# Patient Record
Sex: Male | Born: 1940 | Race: White | Smoking: Former smoker
Health system: Southern US, Community
[De-identification: ages and names within clinical notes are randomized; demographics above are authoritative.]

## PROBLEM LIST (undated history)

## (undated) DIAGNOSIS — E785 Hyperlipidemia, unspecified: Secondary | ICD-10-CM

## (undated) DIAGNOSIS — F329 Major depressive disorder, single episode, unspecified: Secondary | ICD-10-CM

## (undated) DIAGNOSIS — F32A Depression, unspecified: Secondary | ICD-10-CM

## (undated) HISTORY — DX: Depression, unspecified: F32.A

## (undated) HISTORY — DX: Hyperlipidemia, unspecified: E78.5

---
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
## 2015-06-14 DIAGNOSIS — D485 Neoplasm of uncertain behavior of skin: Secondary | ICD-10-CM | POA: Diagnosis not present

## 2015-06-14 DIAGNOSIS — L821 Other seborrheic keratosis: Secondary | ICD-10-CM | POA: Diagnosis not present

## 2015-06-14 DIAGNOSIS — D239 Other benign neoplasm of skin, unspecified: Secondary | ICD-10-CM | POA: Diagnosis not present

## 2015-06-14 DIAGNOSIS — D229 Melanocytic nevi, unspecified: Secondary | ICD-10-CM | POA: Diagnosis not present

## 2015-06-14 DIAGNOSIS — L570 Actinic keratosis: Secondary | ICD-10-CM | POA: Diagnosis not present

## 2015-06-14 DIAGNOSIS — L309 Dermatitis, unspecified: Secondary | ICD-10-CM | POA: Diagnosis not present

## 2015-06-14 DIAGNOSIS — C44329 Squamous cell carcinoma of skin of other parts of face: Secondary | ICD-10-CM | POA: Diagnosis not present

## 2015-06-14 DIAGNOSIS — D1800 Hemangioma unspecified site: Secondary | ICD-10-CM | POA: Diagnosis not present

## 2015-07-02 DIAGNOSIS — I2510 Atherosclerotic heart disease of native coronary artery without angina pectoris: Secondary | ICD-10-CM | POA: Diagnosis not present

## 2015-07-02 DIAGNOSIS — N529 Male erectile dysfunction, unspecified: Secondary | ICD-10-CM | POA: Diagnosis not present

## 2015-07-02 DIAGNOSIS — I10 Essential (primary) hypertension: Secondary | ICD-10-CM | POA: Diagnosis not present

## 2015-07-03 DIAGNOSIS — Z6828 Body mass index (BMI) 28.0-28.9, adult: Secondary | ICD-10-CM | POA: Diagnosis not present

## 2015-07-03 DIAGNOSIS — I2510 Atherosclerotic heart disease of native coronary artery without angina pectoris: Secondary | ICD-10-CM | POA: Diagnosis not present

## 2015-07-03 DIAGNOSIS — Z0000 Encounter for general adult medical examination without abnormal findings: Secondary | ICD-10-CM | POA: Diagnosis not present

## 2015-07-03 DIAGNOSIS — I10 Essential (primary) hypertension: Secondary | ICD-10-CM | POA: Diagnosis not present

## 2015-07-03 DIAGNOSIS — E119 Type 2 diabetes mellitus without complications: Secondary | ICD-10-CM | POA: Diagnosis not present

## 2015-07-03 DIAGNOSIS — F329 Major depressive disorder, single episode, unspecified: Secondary | ICD-10-CM | POA: Diagnosis not present

## 2015-07-03 DIAGNOSIS — E782 Mixed hyperlipidemia: Secondary | ICD-10-CM | POA: Diagnosis not present

## 2015-07-03 DIAGNOSIS — Z125 Encounter for screening for malignant neoplasm of prostate: Secondary | ICD-10-CM | POA: Diagnosis not present

## 2015-07-16 DIAGNOSIS — F321 Major depressive disorder, single episode, moderate: Secondary | ICD-10-CM | POA: Diagnosis not present

## 2015-07-26 DIAGNOSIS — L570 Actinic keratosis: Secondary | ICD-10-CM | POA: Diagnosis not present

## 2015-07-26 DIAGNOSIS — C44329 Squamous cell carcinoma of skin of other parts of face: Secondary | ICD-10-CM | POA: Diagnosis not present

## 2015-08-27 DIAGNOSIS — C44329 Squamous cell carcinoma of skin of other parts of face: Secondary | ICD-10-CM | POA: Diagnosis not present

## 2015-08-27 DIAGNOSIS — L570 Actinic keratosis: Secondary | ICD-10-CM | POA: Diagnosis not present

## 2015-09-03 DIAGNOSIS — L570 Actinic keratosis: Secondary | ICD-10-CM | POA: Diagnosis not present

## 2015-12-31 DIAGNOSIS — I10 Essential (primary) hypertension: Secondary | ICD-10-CM | POA: Diagnosis not present

## 2015-12-31 DIAGNOSIS — M6289 Other specified disorders of muscle: Secondary | ICD-10-CM | POA: Diagnosis not present

## 2015-12-31 DIAGNOSIS — I2510 Atherosclerotic heart disease of native coronary artery without angina pectoris: Secondary | ICD-10-CM | POA: Diagnosis not present

## 2016-01-16 DIAGNOSIS — B351 Tinea unguium: Secondary | ICD-10-CM | POA: Diagnosis not present

## 2016-01-16 DIAGNOSIS — F331 Major depressive disorder, recurrent, moderate: Secondary | ICD-10-CM | POA: Diagnosis not present

## 2016-01-16 DIAGNOSIS — Z23 Encounter for immunization: Secondary | ICD-10-CM | POA: Diagnosis not present

## 2016-02-04 DIAGNOSIS — I2510 Atherosclerotic heart disease of native coronary artery without angina pectoris: Secondary | ICD-10-CM | POA: Diagnosis not present

## 2016-02-04 DIAGNOSIS — I10 Essential (primary) hypertension: Secondary | ICD-10-CM | POA: Diagnosis not present

## 2016-04-21 DIAGNOSIS — Z08 Encounter for follow-up examination after completed treatment for malignant neoplasm: Secondary | ICD-10-CM | POA: Diagnosis not present

## 2016-04-21 DIAGNOSIS — X32XXXA Exposure to sunlight, initial encounter: Secondary | ICD-10-CM | POA: Diagnosis not present

## 2016-04-21 DIAGNOSIS — C44311 Basal cell carcinoma of skin of nose: Secondary | ICD-10-CM | POA: Diagnosis not present

## 2016-04-21 DIAGNOSIS — Z85828 Personal history of other malignant neoplasm of skin: Secondary | ICD-10-CM | POA: Diagnosis not present

## 2016-04-21 DIAGNOSIS — L570 Actinic keratosis: Secondary | ICD-10-CM | POA: Diagnosis not present

## 2016-06-04 DIAGNOSIS — Z85828 Personal history of other malignant neoplasm of skin: Secondary | ICD-10-CM | POA: Diagnosis not present

## 2016-06-04 DIAGNOSIS — L820 Inflamed seborrheic keratosis: Secondary | ICD-10-CM | POA: Diagnosis not present

## 2016-06-04 DIAGNOSIS — Z08 Encounter for follow-up examination after completed treatment for malignant neoplasm: Secondary | ICD-10-CM | POA: Diagnosis not present

## 2016-06-10 DIAGNOSIS — I2510 Atherosclerotic heart disease of native coronary artery without angina pectoris: Secondary | ICD-10-CM | POA: Diagnosis not present

## 2016-07-02 DIAGNOSIS — E782 Mixed hyperlipidemia: Secondary | ICD-10-CM | POA: Diagnosis not present

## 2016-07-02 DIAGNOSIS — Z6828 Body mass index (BMI) 28.0-28.9, adult: Secondary | ICD-10-CM | POA: Diagnosis not present

## 2016-07-02 DIAGNOSIS — M4302 Spondylolysis, cervical region: Secondary | ICD-10-CM | POA: Diagnosis not present

## 2016-07-02 DIAGNOSIS — Z0000 Encounter for general adult medical examination without abnormal findings: Secondary | ICD-10-CM | POA: Diagnosis not present

## 2016-07-02 DIAGNOSIS — Z23 Encounter for immunization: Secondary | ICD-10-CM | POA: Diagnosis not present

## 2016-07-02 DIAGNOSIS — M6289 Other specified disorders of muscle: Secondary | ICD-10-CM | POA: Diagnosis not present

## 2016-07-02 DIAGNOSIS — I10 Essential (primary) hypertension: Secondary | ICD-10-CM | POA: Diagnosis not present

## 2016-07-02 DIAGNOSIS — Z125 Encounter for screening for malignant neoplasm of prostate: Secondary | ICD-10-CM | POA: Diagnosis not present

## 2016-07-02 DIAGNOSIS — I2510 Atherosclerotic heart disease of native coronary artery without angina pectoris: Secondary | ICD-10-CM | POA: Diagnosis not present

## 2016-12-19 DIAGNOSIS — L570 Actinic keratosis: Secondary | ICD-10-CM | POA: Diagnosis not present

## 2016-12-19 DIAGNOSIS — L821 Other seborrheic keratosis: Secondary | ICD-10-CM | POA: Diagnosis not present

## 2016-12-19 DIAGNOSIS — Z85828 Personal history of other malignant neoplasm of skin: Secondary | ICD-10-CM | POA: Diagnosis not present

## 2016-12-19 DIAGNOSIS — D1801 Hemangioma of skin and subcutaneous tissue: Secondary | ICD-10-CM | POA: Diagnosis not present

## 2016-12-30 DIAGNOSIS — M542 Cervicalgia: Secondary | ICD-10-CM | POA: Diagnosis not present

## 2016-12-30 DIAGNOSIS — S46012A Strain of muscle(s) and tendon(s) of the rotator cuff of left shoulder, initial encounter: Secondary | ICD-10-CM | POA: Diagnosis not present

## 2016-12-30 DIAGNOSIS — F329 Major depressive disorder, single episode, unspecified: Secondary | ICD-10-CM | POA: Diagnosis not present

## 2016-12-30 DIAGNOSIS — R6882 Decreased libido: Secondary | ICD-10-CM | POA: Diagnosis not present

## 2016-12-30 DIAGNOSIS — I10 Essential (primary) hypertension: Secondary | ICD-10-CM | POA: Diagnosis not present

## 2016-12-30 DIAGNOSIS — Z23 Encounter for immunization: Secondary | ICD-10-CM | POA: Diagnosis not present

## 2016-12-30 DIAGNOSIS — S46011A Strain of muscle(s) and tendon(s) of the rotator cuff of right shoulder, initial encounter: Secondary | ICD-10-CM | POA: Diagnosis not present

## 2016-12-30 DIAGNOSIS — I2510 Atherosclerotic heart disease of native coronary artery without angina pectoris: Secondary | ICD-10-CM | POA: Diagnosis not present

## 2016-12-30 DIAGNOSIS — N529 Male erectile dysfunction, unspecified: Secondary | ICD-10-CM | POA: Diagnosis not present

## 2017-01-23 DIAGNOSIS — L570 Actinic keratosis: Secondary | ICD-10-CM | POA: Diagnosis not present

## 2017-02-10 DIAGNOSIS — Z23 Encounter for immunization: Secondary | ICD-10-CM | POA: Diagnosis not present

## 2017-02-10 DIAGNOSIS — Z6828 Body mass index (BMI) 28.0-28.9, adult: Secondary | ICD-10-CM | POA: Diagnosis not present

## 2017-02-10 DIAGNOSIS — F331 Major depressive disorder, recurrent, moderate: Secondary | ICD-10-CM | POA: Diagnosis not present

## 2017-02-10 DIAGNOSIS — I2510 Atherosclerotic heart disease of native coronary artery without angina pectoris: Secondary | ICD-10-CM | POA: Diagnosis not present

## 2017-02-10 DIAGNOSIS — M7541 Impingement syndrome of right shoulder: Secondary | ICD-10-CM | POA: Diagnosis not present

## 2017-02-10 DIAGNOSIS — M25519 Pain in unspecified shoulder: Secondary | ICD-10-CM | POA: Diagnosis not present

## 2017-02-10 DIAGNOSIS — E782 Mixed hyperlipidemia: Secondary | ICD-10-CM | POA: Diagnosis not present

## 2017-02-16 ENCOUNTER — Other Ambulatory Visit: Payer: Self-pay | Admitting: Family Medicine

## 2017-02-16 ENCOUNTER — Ambulatory Visit
Admission: RE | Admit: 2017-02-16 | Discharge: 2017-02-16 | Disposition: A | Discharge status: Home / Self Care | Payer: MEDICARE | Source: Ambulatory Visit | Attending: Family Medicine | Admitting: Family Medicine

## 2017-02-16 DIAGNOSIS — M170 Bilateral primary osteoarthritis of knee: Secondary | ICD-10-CM

## 2017-02-16 DIAGNOSIS — M25511 Pain in right shoulder: Secondary | ICD-10-CM

## 2017-02-16 DIAGNOSIS — M179 Osteoarthritis of knee, unspecified: Secondary | ICD-10-CM | POA: Diagnosis not present

## 2017-02-16 DIAGNOSIS — M19011 Primary osteoarthritis, right shoulder: Secondary | ICD-10-CM | POA: Diagnosis not present

## 2017-03-24 DIAGNOSIS — M19011 Primary osteoarthritis, right shoulder: Secondary | ICD-10-CM | POA: Diagnosis not present

## 2017-03-24 DIAGNOSIS — F331 Major depressive disorder, recurrent, moderate: Secondary | ICD-10-CM | POA: Diagnosis not present

## 2017-03-24 DIAGNOSIS — M170 Bilateral primary osteoarthritis of knee: Secondary | ICD-10-CM | POA: Diagnosis not present

## 2017-03-24 DIAGNOSIS — Z6827 Body mass index (BMI) 27.0-27.9, adult: Secondary | ICD-10-CM | POA: Diagnosis not present

## 2017-03-31 ENCOUNTER — Other Ambulatory Visit: Payer: Self-pay | Admitting: Family Medicine

## 2017-04-01 ENCOUNTER — Other Ambulatory Visit: Payer: Self-pay | Admitting: Family Medicine

## 2017-04-01 DIAGNOSIS — M25511 Pain in right shoulder: Secondary | ICD-10-CM

## 2017-04-03 DIAGNOSIS — Z85828 Personal history of other malignant neoplasm of skin: Secondary | ICD-10-CM | POA: Diagnosis not present

## 2017-04-03 DIAGNOSIS — L570 Actinic keratosis: Secondary | ICD-10-CM | POA: Diagnosis not present

## 2017-04-07 DIAGNOSIS — M7541 Impingement syndrome of right shoulder: Secondary | ICD-10-CM | POA: Diagnosis not present

## 2017-04-16 DIAGNOSIS — M7541 Impingement syndrome of right shoulder: Secondary | ICD-10-CM | POA: Diagnosis not present

## 2017-04-20 ENCOUNTER — Ambulatory Visit
Admission: RE | Admit: 2017-04-20 | Discharge: 2017-04-20 | Disposition: A | Discharge status: Home / Self Care | Payer: MEDICARE | Source: Ambulatory Visit | Attending: Family Medicine | Admitting: Family Medicine

## 2017-04-20 DIAGNOSIS — M7541 Impingement syndrome of right shoulder: Secondary | ICD-10-CM | POA: Diagnosis not present

## 2017-04-20 DIAGNOSIS — M25511 Pain in right shoulder: Secondary | ICD-10-CM | POA: Diagnosis not present

## 2017-04-23 DIAGNOSIS — M7541 Impingement syndrome of right shoulder: Secondary | ICD-10-CM | POA: Diagnosis not present

## 2017-05-04 DIAGNOSIS — M7541 Impingement syndrome of right shoulder: Secondary | ICD-10-CM | POA: Diagnosis not present

## 2017-05-08 DIAGNOSIS — M7541 Impingement syndrome of right shoulder: Secondary | ICD-10-CM | POA: Diagnosis not present

## 2017-05-11 DIAGNOSIS — F331 Major depressive disorder, recurrent, moderate: Secondary | ICD-10-CM | POA: Diagnosis not present

## 2017-05-11 DIAGNOSIS — E782 Mixed hyperlipidemia: Secondary | ICD-10-CM | POA: Diagnosis not present

## 2017-05-11 DIAGNOSIS — Z79899 Other long term (current) drug therapy: Secondary | ICD-10-CM | POA: Diagnosis not present

## 2017-05-11 DIAGNOSIS — M19011 Primary osteoarthritis, right shoulder: Secondary | ICD-10-CM | POA: Diagnosis not present

## 2017-05-11 DIAGNOSIS — Z6829 Body mass index (BMI) 29.0-29.9, adult: Secondary | ICD-10-CM | POA: Diagnosis not present

## 2017-05-14 DIAGNOSIS — M7541 Impingement syndrome of right shoulder: Secondary | ICD-10-CM | POA: Diagnosis not present

## 2017-05-19 DIAGNOSIS — R739 Hyperglycemia, unspecified: Secondary | ICD-10-CM | POA: Diagnosis not present

## 2017-05-28 DIAGNOSIS — Z01818 Encounter for other preprocedural examination: Secondary | ICD-10-CM | POA: Diagnosis not present

## 2017-05-28 DIAGNOSIS — M7541 Impingement syndrome of right shoulder: Secondary | ICD-10-CM | POA: Diagnosis not present

## 2017-05-28 DIAGNOSIS — Z6829 Body mass index (BMI) 29.0-29.9, adult: Secondary | ICD-10-CM | POA: Diagnosis not present

## 2017-05-28 DIAGNOSIS — R252 Cramp and spasm: Secondary | ICD-10-CM | POA: Diagnosis not present

## 2017-06-05 HISTORY — PX: SHOULDER SURGERY: SHX246

## 2017-06-15 DIAGNOSIS — S46011A Strain of muscle(s) and tendon(s) of the rotator cuff of right shoulder, initial encounter: Secondary | ICD-10-CM | POA: Diagnosis not present

## 2017-06-15 DIAGNOSIS — M7541 Impingement syndrome of right shoulder: Secondary | ICD-10-CM | POA: Diagnosis not present

## 2017-06-15 DIAGNOSIS — X58XXXA Exposure to other specified factors, initial encounter: Secondary | ICD-10-CM | POA: Diagnosis not present

## 2017-06-15 DIAGNOSIS — Y999 Unspecified external cause status: Secondary | ICD-10-CM | POA: Diagnosis not present

## 2017-06-15 DIAGNOSIS — S46191A Other injury of muscle, fascia and tendon of long head of biceps, right arm, initial encounter: Secondary | ICD-10-CM | POA: Diagnosis not present

## 2017-06-15 DIAGNOSIS — G8918 Other acute postprocedural pain: Secondary | ICD-10-CM | POA: Diagnosis not present

## 2017-06-15 DIAGNOSIS — S43431A Superior glenoid labrum lesion of right shoulder, initial encounter: Secondary | ICD-10-CM | POA: Diagnosis not present

## 2017-06-15 DIAGNOSIS — M19011 Primary osteoarthritis, right shoulder: Secondary | ICD-10-CM | POA: Diagnosis not present

## 2017-06-18 DIAGNOSIS — M25611 Stiffness of right shoulder, not elsewhere classified: Secondary | ICD-10-CM | POA: Diagnosis not present

## 2017-06-22 DIAGNOSIS — M25611 Stiffness of right shoulder, not elsewhere classified: Secondary | ICD-10-CM | POA: Diagnosis not present

## 2017-06-26 DIAGNOSIS — M25611 Stiffness of right shoulder, not elsewhere classified: Secondary | ICD-10-CM | POA: Diagnosis not present

## 2017-06-30 DIAGNOSIS — Z114 Encounter for screening for human immunodeficiency virus [HIV]: Secondary | ICD-10-CM | POA: Diagnosis not present

## 2017-06-30 DIAGNOSIS — Z1329 Encounter for screening for other suspected endocrine disorder: Secondary | ICD-10-CM | POA: Diagnosis not present

## 2017-06-30 DIAGNOSIS — M25611 Stiffness of right shoulder, not elsewhere classified: Secondary | ICD-10-CM | POA: Diagnosis not present

## 2017-06-30 DIAGNOSIS — Z125 Encounter for screening for malignant neoplasm of prostate: Secondary | ICD-10-CM | POA: Diagnosis not present

## 2017-06-30 DIAGNOSIS — E782 Mixed hyperlipidemia: Secondary | ICD-10-CM | POA: Diagnosis not present

## 2017-06-30 DIAGNOSIS — Z79899 Other long term (current) drug therapy: Secondary | ICD-10-CM | POA: Diagnosis not present

## 2017-07-03 DIAGNOSIS — M25611 Stiffness of right shoulder, not elsewhere classified: Secondary | ICD-10-CM | POA: Diagnosis not present

## 2017-07-07 DIAGNOSIS — M25611 Stiffness of right shoulder, not elsewhere classified: Secondary | ICD-10-CM | POA: Diagnosis not present

## 2017-07-14 DIAGNOSIS — M25611 Stiffness of right shoulder, not elsewhere classified: Secondary | ICD-10-CM | POA: Diagnosis not present

## 2017-07-21 DIAGNOSIS — M25611 Stiffness of right shoulder, not elsewhere classified: Secondary | ICD-10-CM | POA: Diagnosis not present

## 2017-07-24 DIAGNOSIS — M25611 Stiffness of right shoulder, not elsewhere classified: Secondary | ICD-10-CM | POA: Diagnosis not present

## 2017-07-28 DIAGNOSIS — M25611 Stiffness of right shoulder, not elsewhere classified: Secondary | ICD-10-CM | POA: Diagnosis not present

## 2017-07-31 DIAGNOSIS — M25611 Stiffness of right shoulder, not elsewhere classified: Secondary | ICD-10-CM | POA: Diagnosis not present

## 2017-08-04 DIAGNOSIS — M25611 Stiffness of right shoulder, not elsewhere classified: Secondary | ICD-10-CM | POA: Diagnosis not present

## 2017-08-06 DIAGNOSIS — L821 Other seborrheic keratosis: Secondary | ICD-10-CM | POA: Diagnosis not present

## 2017-08-06 DIAGNOSIS — L853 Xerosis cutis: Secondary | ICD-10-CM | POA: Diagnosis not present

## 2017-08-06 DIAGNOSIS — D485 Neoplasm of uncertain behavior of skin: Secondary | ICD-10-CM | POA: Diagnosis not present

## 2017-08-06 DIAGNOSIS — D1801 Hemangioma of skin and subcutaneous tissue: Secondary | ICD-10-CM | POA: Diagnosis not present

## 2017-08-06 DIAGNOSIS — L570 Actinic keratosis: Secondary | ICD-10-CM | POA: Diagnosis not present

## 2017-08-06 DIAGNOSIS — L820 Inflamed seborrheic keratosis: Secondary | ICD-10-CM | POA: Diagnosis not present

## 2017-08-06 DIAGNOSIS — Z85828 Personal history of other malignant neoplasm of skin: Secondary | ICD-10-CM | POA: Diagnosis not present

## 2017-08-06 DIAGNOSIS — L814 Other melanin hyperpigmentation: Secondary | ICD-10-CM | POA: Diagnosis not present

## 2017-08-06 DIAGNOSIS — B351 Tinea unguium: Secondary | ICD-10-CM | POA: Diagnosis not present

## 2017-08-06 DIAGNOSIS — C44519 Basal cell carcinoma of skin of other part of trunk: Secondary | ICD-10-CM | POA: Diagnosis not present

## 2017-08-07 DIAGNOSIS — Z1211 Encounter for screening for malignant neoplasm of colon: Secondary | ICD-10-CM | POA: Diagnosis not present

## 2017-08-07 DIAGNOSIS — Z6828 Body mass index (BMI) 28.0-28.9, adult: Secondary | ICD-10-CM | POA: Diagnosis not present

## 2017-08-07 DIAGNOSIS — E118 Type 2 diabetes mellitus with unspecified complications: Secondary | ICD-10-CM | POA: Diagnosis not present

## 2017-08-07 DIAGNOSIS — K5900 Constipation, unspecified: Secondary | ICD-10-CM | POA: Diagnosis not present

## 2017-08-07 DIAGNOSIS — M25611 Stiffness of right shoulder, not elsewhere classified: Secondary | ICD-10-CM | POA: Diagnosis not present

## 2017-08-07 DIAGNOSIS — Z0000 Encounter for general adult medical examination without abnormal findings: Secondary | ICD-10-CM | POA: Diagnosis not present

## 2017-08-07 DIAGNOSIS — Z9189 Other specified personal risk factors, not elsewhere classified: Secondary | ICD-10-CM | POA: Diagnosis not present

## 2017-08-07 DIAGNOSIS — B351 Tinea unguium: Secondary | ICD-10-CM | POA: Diagnosis not present

## 2017-08-07 DIAGNOSIS — E782 Mixed hyperlipidemia: Secondary | ICD-10-CM | POA: Diagnosis not present

## 2017-08-11 DIAGNOSIS — M25611 Stiffness of right shoulder, not elsewhere classified: Secondary | ICD-10-CM | POA: Diagnosis not present

## 2017-08-14 DIAGNOSIS — M25611 Stiffness of right shoulder, not elsewhere classified: Secondary | ICD-10-CM | POA: Diagnosis not present

## 2017-08-17 DIAGNOSIS — M25611 Stiffness of right shoulder, not elsewhere classified: Secondary | ICD-10-CM | POA: Diagnosis not present

## 2017-08-20 DIAGNOSIS — M25611 Stiffness of right shoulder, not elsewhere classified: Secondary | ICD-10-CM | POA: Diagnosis not present

## 2017-08-25 DIAGNOSIS — M25611 Stiffness of right shoulder, not elsewhere classified: Secondary | ICD-10-CM | POA: Diagnosis not present

## 2017-08-27 DIAGNOSIS — M25611 Stiffness of right shoulder, not elsewhere classified: Secondary | ICD-10-CM | POA: Diagnosis not present

## 2017-09-01 DIAGNOSIS — M25611 Stiffness of right shoulder, not elsewhere classified: Secondary | ICD-10-CM | POA: Diagnosis not present

## 2017-09-03 DIAGNOSIS — F411 Generalized anxiety disorder: Secondary | ICD-10-CM | POA: Diagnosis not present

## 2017-09-03 DIAGNOSIS — E118 Type 2 diabetes mellitus with unspecified complications: Secondary | ICD-10-CM | POA: Diagnosis not present

## 2017-09-03 DIAGNOSIS — Z6828 Body mass index (BMI) 28.0-28.9, adult: Secondary | ICD-10-CM | POA: Diagnosis not present

## 2017-09-03 DIAGNOSIS — F331 Major depressive disorder, recurrent, moderate: Secondary | ICD-10-CM | POA: Diagnosis not present

## 2017-09-04 DIAGNOSIS — M25611 Stiffness of right shoulder, not elsewhere classified: Secondary | ICD-10-CM | POA: Diagnosis not present

## 2017-09-08 DIAGNOSIS — M25611 Stiffness of right shoulder, not elsewhere classified: Secondary | ICD-10-CM | POA: Diagnosis not present

## 2017-09-11 DIAGNOSIS — M25611 Stiffness of right shoulder, not elsewhere classified: Secondary | ICD-10-CM | POA: Diagnosis not present

## 2017-09-15 DIAGNOSIS — M25611 Stiffness of right shoulder, not elsewhere classified: Secondary | ICD-10-CM | POA: Diagnosis not present

## 2017-09-21 DIAGNOSIS — M25611 Stiffness of right shoulder, not elsewhere classified: Secondary | ICD-10-CM | POA: Diagnosis not present

## 2017-09-22 DIAGNOSIS — Z9889 Other specified postprocedural states: Secondary | ICD-10-CM | POA: Diagnosis not present

## 2017-09-22 DIAGNOSIS — M25611 Stiffness of right shoulder, not elsewhere classified: Secondary | ICD-10-CM | POA: Diagnosis not present

## 2017-09-23 DIAGNOSIS — Z6828 Body mass index (BMI) 28.0-28.9, adult: Secondary | ICD-10-CM | POA: Diagnosis not present

## 2017-09-23 DIAGNOSIS — F411 Generalized anxiety disorder: Secondary | ICD-10-CM | POA: Diagnosis not present

## 2017-09-23 DIAGNOSIS — F331 Major depressive disorder, recurrent, moderate: Secondary | ICD-10-CM | POA: Diagnosis not present

## 2017-09-23 DIAGNOSIS — E118 Type 2 diabetes mellitus with unspecified complications: Secondary | ICD-10-CM | POA: Diagnosis not present

## 2017-09-23 DIAGNOSIS — M19011 Primary osteoarthritis, right shoulder: Secondary | ICD-10-CM | POA: Diagnosis not present

## 2017-09-30 DIAGNOSIS — B078 Other viral warts: Secondary | ICD-10-CM | POA: Diagnosis not present

## 2017-09-30 DIAGNOSIS — M25611 Stiffness of right shoulder, not elsewhere classified: Secondary | ICD-10-CM | POA: Diagnosis not present

## 2017-09-30 DIAGNOSIS — Z85828 Personal history of other malignant neoplasm of skin: Secondary | ICD-10-CM | POA: Diagnosis not present

## 2017-09-30 DIAGNOSIS — C44519 Basal cell carcinoma of skin of other part of trunk: Secondary | ICD-10-CM | POA: Diagnosis not present

## 2017-10-06 DIAGNOSIS — M25611 Stiffness of right shoulder, not elsewhere classified: Secondary | ICD-10-CM | POA: Diagnosis not present

## 2017-10-13 DIAGNOSIS — M25611 Stiffness of right shoulder, not elsewhere classified: Secondary | ICD-10-CM | POA: Diagnosis not present

## 2017-10-20 DIAGNOSIS — M25611 Stiffness of right shoulder, not elsewhere classified: Secondary | ICD-10-CM | POA: Diagnosis not present

## 2017-10-27 DIAGNOSIS — M25611 Stiffness of right shoulder, not elsewhere classified: Secondary | ICD-10-CM | POA: Diagnosis not present

## 2017-11-02 DIAGNOSIS — E118 Type 2 diabetes mellitus with unspecified complications: Secondary | ICD-10-CM | POA: Diagnosis not present

## 2017-11-02 DIAGNOSIS — F331 Major depressive disorder, recurrent, moderate: Secondary | ICD-10-CM | POA: Diagnosis not present

## 2017-11-02 DIAGNOSIS — F411 Generalized anxiety disorder: Secondary | ICD-10-CM | POA: Diagnosis not present

## 2017-11-02 DIAGNOSIS — Z6826 Body mass index (BMI) 26.0-26.9, adult: Secondary | ICD-10-CM | POA: Diagnosis not present

## 2017-11-03 DIAGNOSIS — Z9889 Other specified postprocedural states: Secondary | ICD-10-CM | POA: Diagnosis not present

## 2017-11-03 DIAGNOSIS — M25511 Pain in right shoulder: Secondary | ICD-10-CM | POA: Diagnosis not present

## 2017-11-17 DIAGNOSIS — Z79899 Other long term (current) drug therapy: Secondary | ICD-10-CM | POA: Diagnosis not present

## 2017-11-17 DIAGNOSIS — B351 Tinea unguium: Secondary | ICD-10-CM | POA: Diagnosis not present

## 2018-02-02 DIAGNOSIS — E782 Mixed hyperlipidemia: Secondary | ICD-10-CM | POA: Diagnosis not present

## 2018-02-02 DIAGNOSIS — E118 Type 2 diabetes mellitus with unspecified complications: Secondary | ICD-10-CM | POA: Diagnosis not present

## 2018-02-02 DIAGNOSIS — Z9189 Other specified personal risk factors, not elsewhere classified: Secondary | ICD-10-CM | POA: Diagnosis not present

## 2018-02-05 DIAGNOSIS — E782 Mixed hyperlipidemia: Secondary | ICD-10-CM | POA: Diagnosis not present

## 2018-02-05 DIAGNOSIS — Z23 Encounter for immunization: Secondary | ICD-10-CM | POA: Diagnosis not present

## 2018-02-05 DIAGNOSIS — F411 Generalized anxiety disorder: Secondary | ICD-10-CM | POA: Diagnosis not present

## 2018-02-05 DIAGNOSIS — I519 Heart disease, unspecified: Secondary | ICD-10-CM | POA: Diagnosis not present

## 2018-02-05 DIAGNOSIS — E118 Type 2 diabetes mellitus with unspecified complications: Secondary | ICD-10-CM | POA: Diagnosis not present

## 2018-03-10 DIAGNOSIS — I77811 Abdominal aortic ectasia: Secondary | ICD-10-CM | POA: Diagnosis not present

## 2018-03-18 DIAGNOSIS — I447 Left bundle-branch block, unspecified: Secondary | ICD-10-CM | POA: Diagnosis not present

## 2018-03-18 DIAGNOSIS — I2510 Atherosclerotic heart disease of native coronary artery without angina pectoris: Secondary | ICD-10-CM | POA: Diagnosis not present

## 2018-03-18 DIAGNOSIS — I77811 Abdominal aortic ectasia: Secondary | ICD-10-CM | POA: Diagnosis not present

## 2018-07-02 ENCOUNTER — Other Ambulatory Visit: Payer: Self-pay | Admitting: Family Medicine

## 2018-07-02 ENCOUNTER — Ambulatory Visit
Admission: RE | Admit: 2018-07-02 | Discharge: 2018-07-02 | Disposition: A | Discharge status: Home / Self Care | Payer: MEDICARE | Source: Ambulatory Visit | Attending: Family Medicine | Admitting: Family Medicine

## 2018-07-02 DIAGNOSIS — S3992XA Unspecified injury of lower back, initial encounter: Secondary | ICD-10-CM | POA: Diagnosis not present

## 2018-07-02 DIAGNOSIS — K5900 Constipation, unspecified: Secondary | ICD-10-CM | POA: Diagnosis not present

## 2018-07-02 DIAGNOSIS — Z6828 Body mass index (BMI) 28.0-28.9, adult: Secondary | ICD-10-CM | POA: Diagnosis not present

## 2018-07-02 DIAGNOSIS — W19XXXA Unspecified fall, initial encounter: Secondary | ICD-10-CM

## 2018-07-02 DIAGNOSIS — Z9189 Other specified personal risk factors, not elsewhere classified: Secondary | ICD-10-CM | POA: Diagnosis not present

## 2018-07-02 DIAGNOSIS — M545 Low back pain: Secondary | ICD-10-CM | POA: Diagnosis not present

## 2018-07-02 DIAGNOSIS — M533 Sacrococcygeal disorders, not elsewhere classified: Secondary | ICD-10-CM | POA: Diagnosis not present

## 2018-07-02 DIAGNOSIS — R52 Pain, unspecified: Secondary | ICD-10-CM

## 2018-07-05 DIAGNOSIS — M545 Low back pain: Secondary | ICD-10-CM | POA: Diagnosis not present

## 2018-07-07 DIAGNOSIS — K5900 Constipation, unspecified: Secondary | ICD-10-CM | POA: Diagnosis not present

## 2018-07-07 DIAGNOSIS — M4856XD Collapsed vertebra, not elsewhere classified, lumbar region, subsequent encounter for fracture with routine healing: Secondary | ICD-10-CM | POA: Diagnosis not present

## 2018-07-07 DIAGNOSIS — Z6826 Body mass index (BMI) 26.0-26.9, adult: Secondary | ICD-10-CM | POA: Diagnosis not present

## 2018-07-07 DIAGNOSIS — M4857XD Collapsed vertebra, not elsewhere classified, lumbosacral region, subsequent encounter for fracture with routine healing: Secondary | ICD-10-CM | POA: Diagnosis not present

## 2018-07-13 DIAGNOSIS — M545 Low back pain: Secondary | ICD-10-CM | POA: Diagnosis not present

## 2018-07-19 DIAGNOSIS — M4856XD Collapsed vertebra, not elsewhere classified, lumbar region, subsequent encounter for fracture with routine healing: Secondary | ICD-10-CM | POA: Diagnosis not present

## 2018-07-23 ENCOUNTER — Other Ambulatory Visit: Payer: Self-pay | Admitting: Orthopedic Surgery

## 2018-07-23 ENCOUNTER — Other Ambulatory Visit (HOSPITAL_COMMUNITY): Payer: Self-pay | Admitting: Orthopedic Surgery

## 2018-07-23 DIAGNOSIS — M4856XD Collapsed vertebra, not elsewhere classified, lumbar region, subsequent encounter for fracture with routine healing: Secondary | ICD-10-CM

## 2018-08-10 DIAGNOSIS — M4857XD Collapsed vertebra, not elsewhere classified, lumbosacral region, subsequent encounter for fracture with routine healing: Secondary | ICD-10-CM | POA: Diagnosis not present

## 2018-08-10 DIAGNOSIS — Z0000 Encounter for general adult medical examination without abnormal findings: Secondary | ICD-10-CM | POA: Diagnosis not present

## 2018-08-10 DIAGNOSIS — I2510 Atherosclerotic heart disease of native coronary artery without angina pectoris: Secondary | ICD-10-CM | POA: Diagnosis not present

## 2018-08-10 DIAGNOSIS — F331 Major depressive disorder, recurrent, moderate: Secondary | ICD-10-CM | POA: Diagnosis not present

## 2018-08-10 DIAGNOSIS — I5030 Unspecified diastolic (congestive) heart failure: Secondary | ICD-10-CM | POA: Diagnosis not present

## 2018-08-10 DIAGNOSIS — E118 Type 2 diabetes mellitus with unspecified complications: Secondary | ICD-10-CM | POA: Diagnosis not present

## 2018-08-10 DIAGNOSIS — F411 Generalized anxiety disorder: Secondary | ICD-10-CM | POA: Diagnosis not present

## 2018-10-14 ENCOUNTER — Other Ambulatory Visit: Payer: Self-pay

## 2018-10-14 MED ORDER — FUROSEMIDE 40 MG PO TABS: 40.0000 mg | ORAL_TABLET | ORAL | 0 refills | Status: DC | PRN

## 2018-11-22 DIAGNOSIS — M4856XD Collapsed vertebra, not elsewhere classified, lumbar region, subsequent encounter for fracture with routine healing: Secondary | ICD-10-CM | POA: Diagnosis not present

## 2018-12-06 DIAGNOSIS — M545 Low back pain: Secondary | ICD-10-CM | POA: Diagnosis not present

## 2018-12-13 DIAGNOSIS — M545 Low back pain: Secondary | ICD-10-CM | POA: Diagnosis not present

## 2018-12-16 DIAGNOSIS — M545 Low back pain: Secondary | ICD-10-CM | POA: Diagnosis not present

## 2018-12-24 DIAGNOSIS — M545 Low back pain: Secondary | ICD-10-CM | POA: Diagnosis not present

## 2018-12-27 DIAGNOSIS — M545 Low back pain: Secondary | ICD-10-CM | POA: Diagnosis not present

## 2018-12-30 DIAGNOSIS — M545 Low back pain: Secondary | ICD-10-CM | POA: Diagnosis not present

## 2019-01-11 DIAGNOSIS — D485 Neoplasm of uncertain behavior of skin: Secondary | ICD-10-CM | POA: Diagnosis not present

## 2019-01-11 DIAGNOSIS — C44612 Basal cell carcinoma of skin of right upper limb, including shoulder: Secondary | ICD-10-CM | POA: Diagnosis not present

## 2019-01-11 DIAGNOSIS — L814 Other melanin hyperpigmentation: Secondary | ICD-10-CM | POA: Diagnosis not present

## 2019-01-11 DIAGNOSIS — L570 Actinic keratosis: Secondary | ICD-10-CM | POA: Diagnosis not present

## 2019-01-11 DIAGNOSIS — C44321 Squamous cell carcinoma of skin of nose: Secondary | ICD-10-CM | POA: Diagnosis not present

## 2019-01-11 DIAGNOSIS — D225 Melanocytic nevi of trunk: Secondary | ICD-10-CM | POA: Diagnosis not present

## 2019-01-11 DIAGNOSIS — L821 Other seborrheic keratosis: Secondary | ICD-10-CM | POA: Diagnosis not present

## 2019-01-11 DIAGNOSIS — D1801 Hemangioma of skin and subcutaneous tissue: Secondary | ICD-10-CM | POA: Diagnosis not present

## 2019-01-11 DIAGNOSIS — Z85828 Personal history of other malignant neoplasm of skin: Secondary | ICD-10-CM | POA: Diagnosis not present

## 2019-01-12 DIAGNOSIS — M545 Low back pain: Secondary | ICD-10-CM | POA: Diagnosis not present

## 2019-01-24 DIAGNOSIS — Z85828 Personal history of other malignant neoplasm of skin: Secondary | ICD-10-CM | POA: Diagnosis not present

## 2019-01-24 DIAGNOSIS — C44321 Squamous cell carcinoma of skin of nose: Secondary | ICD-10-CM | POA: Diagnosis not present

## 2019-01-25 DIAGNOSIS — Z114 Encounter for screening for human immunodeficiency virus [HIV]: Secondary | ICD-10-CM | POA: Diagnosis not present

## 2019-01-25 DIAGNOSIS — Z125 Encounter for screening for malignant neoplasm of prostate: Secondary | ICD-10-CM | POA: Diagnosis not present

## 2019-01-25 DIAGNOSIS — Z79899 Other long term (current) drug therapy: Secondary | ICD-10-CM | POA: Diagnosis not present

## 2019-01-25 DIAGNOSIS — E782 Mixed hyperlipidemia: Secondary | ICD-10-CM | POA: Diagnosis not present

## 2019-01-25 DIAGNOSIS — Z1329 Encounter for screening for other suspected endocrine disorder: Secondary | ICD-10-CM | POA: Diagnosis not present

## 2019-01-25 DIAGNOSIS — Z9189 Other specified personal risk factors, not elsewhere classified: Secondary | ICD-10-CM | POA: Diagnosis not present

## 2019-01-25 DIAGNOSIS — E118 Type 2 diabetes mellitus with unspecified complications: Secondary | ICD-10-CM | POA: Diagnosis not present

## 2019-01-28 DIAGNOSIS — L570 Actinic keratosis: Secondary | ICD-10-CM | POA: Diagnosis not present

## 2019-02-10 DIAGNOSIS — L821 Other seborrheic keratosis: Secondary | ICD-10-CM | POA: Diagnosis not present

## 2019-02-10 DIAGNOSIS — C44612 Basal cell carcinoma of skin of right upper limb, including shoulder: Secondary | ICD-10-CM | POA: Diagnosis not present

## 2019-02-10 DIAGNOSIS — L570 Actinic keratosis: Secondary | ICD-10-CM | POA: Diagnosis not present

## 2019-02-10 DIAGNOSIS — Z85828 Personal history of other malignant neoplasm of skin: Secondary | ICD-10-CM | POA: Diagnosis not present

## 2019-02-14 DIAGNOSIS — I519 Heart disease, unspecified: Secondary | ICD-10-CM | POA: Diagnosis not present

## 2019-02-14 DIAGNOSIS — F331 Major depressive disorder, recurrent, moderate: Secondary | ICD-10-CM | POA: Diagnosis not present

## 2019-02-14 DIAGNOSIS — I2510 Atherosclerotic heart disease of native coronary artery without angina pectoris: Secondary | ICD-10-CM | POA: Diagnosis not present

## 2019-02-14 DIAGNOSIS — E118 Type 2 diabetes mellitus with unspecified complications: Secondary | ICD-10-CM | POA: Diagnosis not present

## 2019-02-14 DIAGNOSIS — E782 Mixed hyperlipidemia: Secondary | ICD-10-CM | POA: Diagnosis not present

## 2019-02-22 DIAGNOSIS — Z23 Encounter for immunization: Secondary | ICD-10-CM | POA: Diagnosis not present

## 2019-03-23 ENCOUNTER — Ambulatory Visit: Payer: MEDICARE | Admitting: Cardiology

## 2019-03-25 DIAGNOSIS — L570 Actinic keratosis: Secondary | ICD-10-CM | POA: Diagnosis not present

## 2019-03-25 DIAGNOSIS — Z85828 Personal history of other malignant neoplasm of skin: Secondary | ICD-10-CM | POA: Diagnosis not present

## 2019-04-22 NOTE — Progress Notes (Signed)
Follow up visit  Subjective:   Phillip Allen, male    DOB: [birth year], 78 y.o.   MRN: [id number]   HPI  78 y.o. Caucasian male with hypertension, coronary artery disease s/p angioplasty in 2010, left bundle branch block, mild aortic ectasia, grade 2 diastolic dysfunction.  Patient has no complaints today. He had a compression fracture of his spine earlier this year, but is recovering from it. He only takes occasional ibuprofen. He is not going to the gym due to the pandemic, but stays active walking etc. He denies chest pain, shortness of breath, palpitations, leg edema, orthopnea, PND, TIA/syncope. Blood pressure is elevated today, but has never been elevated before. He recently had blood work done through his PCP.   Current Outpatient Medications on File Prior to Visit  Medication Sig Dispense Refill  . aspirin (ASPIR-LOW) 81 MG EC tablet Take 81 mg by mouth daily.    . Boswellia-Glucosamine-Vit D (OSTEO BI-FLEX ONE PER DAY PO) Take by mouth. 2 daily    . Docusate Sodium (DSS) 100 MG CAPS 1 in the a.m. 2 at night    . DULoxetine (CYMBALTA) 60 MG capsule Take 60 mg by mouth daily.    Javier Docker Oil 500 MG CAPS Take by mouth daily.    . Multiple Vitamins-Minerals (MULTIVITAMIN MEN 50+ PO) Take by mouth daily.    . naproxen sodium (ALEVE) 220 MG tablet Take 220 mg by mouth as needed.    . rosuvastatin (CRESTOR) 40 MG tablet Take 40 mg by mouth daily.     No current facility-administered medications on file prior to visit.    Cardiovascular & other pertient studies:   EKG 04/25/2019: Sinus rhythm 69 bpm. First degree A-V block  Left bundle branch block.    Aorta duplex 03/10/2018: Maximum aorta diameter 2.65 cm.  Diffuse calcific plaque noted in proximal, mid, and distal aorta.  Mild abdominal aortic tic ectasia noted.  No significant change compared to previous study in 2018.  Follow-up when clinically indicated.  Echocardiogram 04/03/2017: EF 58%.  Abnormal septal motion due to  left bundle branch block.  Grade 2 diastolic dysfunction.  Elevated left atrial pressure. Moderate left atrial dilatation. Mild aortic valve calcification.  Trace aortic stenosis. Mild to moderate mitral regurgitation. Mild to moderate tricuspid regurgitation. No evidence of pulmonary hypertension.  Recent labs: No recent labs available   Review of Systems  Constitution: Negative for decreased appetite, malaise/fatigue, weight gain and weight loss.  HENT: Negative for congestion.   Eyes: Negative for visual disturbance.  Cardiovascular: Negative for chest pain, dyspnea on exertion, leg swelling, palpitations and syncope.  Respiratory: Negative for cough.   Endocrine: Negative for cold intolerance.  Hematologic/Lymphatic: Does not bruise/bleed easily.  Skin: Negative for itching and rash.  Musculoskeletal: Negative for myalgias.  Gastrointestinal: Negative for abdominal pain, nausea and vomiting.  Genitourinary: Negative for dysuria.  Neurological: Negative for dizziness and weakness.  Psychiatric/Behavioral: The patient is not nervous/anxious.   All other systems reviewed and are negative.        Vitals:   04/25/19 1044 04/25/19 1108  BP: (!) 142/75 (!) 151/73  Pulse: 65 66  Temp: (!) 97.3 F (36.3 C)   SpO2: 97%      Body mass index is 26.86 kg/m. Filed Weights   04/25/19 1044  Weight: 181 lb 14.4 oz (82.5 kg)     Objective:   Physical Exam  Constitutional: He is oriented to person, place, and time. He appears well-developed and well-nourished. No  distress.  HENT:  Head: Normocephalic and atraumatic.  Eyes: Pupils are equal, round, and reactive to light. Conjunctivae are normal.  Neck: No JVD present.  Cardiovascular: Normal rate, regular rhythm and intact distal pulses.  No murmur heard. Pulmonary/Chest: Effort normal and breath sounds normal. He has no wheezes. He has no rales.  Abdominal: Soft. Bowel sounds are normal. There is no rebound.    Musculoskeletal:        General: No edema.  Lymphadenopathy:    He has no cervical adenopathy.  Neurological: He is alert and oriented to person, place, and time. No cranial nerve deficit.  Skin: Skin is warm and dry.  Psychiatric: He has a normal mood and affect.  Nursing note and vitals reviewed.         Assessment & Recommendations:   78 y.o. Caucasian male with hypertension, coronary artery disease s/p angioplasty in 2010, left bundle branch block, mild aortic ectasia, grade 2 diastolic dysfunction.  1. Elevated blood pressure reading in office with white coat syndrome, without diagnosis of hypertension Encourage home monitoring. Recommend repeat visit for BP check in 3-4 weeks. Encourage regular waling and low salt diet.   2. Coronary artery disease involving native coronary artery of native heart without angina pectoris No symptoms. Continue Aspirin, statin. Will get labs from PCP.    [name], MD [facility] Pager: [phone] Office: [phone]

## 2019-04-25 ENCOUNTER — Other Ambulatory Visit: Payer: Self-pay

## 2019-04-25 ENCOUNTER — Encounter: Payer: Self-pay | Admitting: Cardiology

## 2019-04-25 ENCOUNTER — Ambulatory Visit (INDEPENDENT_AMBULATORY_CARE_PROVIDER_SITE_OTHER): Payer: MEDICARE | Admitting: Cardiology

## 2019-04-25 VITALS — BP 151/73 | HR 66 | Temp 97.3°F | Ht 69.0 in | Wt 181.9 lb

## 2019-04-25 DIAGNOSIS — R030 Elevated blood-pressure reading, without diagnosis of hypertension: Secondary | ICD-10-CM | POA: Diagnosis not present

## 2019-04-25 DIAGNOSIS — I2510 Atherosclerotic heart disease of native coronary artery without angina pectoris: Secondary | ICD-10-CM | POA: Diagnosis not present

## 2019-04-25 DIAGNOSIS — I447 Left bundle-branch block, unspecified: Secondary | ICD-10-CM

## 2019-05-23 ENCOUNTER — Encounter: Payer: Self-pay | Admitting: Cardiology

## 2019-05-23 ENCOUNTER — Ambulatory Visit (INDEPENDENT_AMBULATORY_CARE_PROVIDER_SITE_OTHER): Payer: MEDICARE | Admitting: Cardiology

## 2019-05-23 ENCOUNTER — Other Ambulatory Visit: Payer: Self-pay

## 2019-05-23 VITALS — BP 145/74 | HR 64 | Ht 69.0 in | Wt 181.0 lb

## 2019-05-23 DIAGNOSIS — I10 Essential (primary) hypertension: Secondary | ICD-10-CM | POA: Diagnosis not present

## 2019-05-23 DIAGNOSIS — I2510 Atherosclerotic heart disease of native coronary artery without angina pectoris: Secondary | ICD-10-CM | POA: Diagnosis not present

## 2019-05-23 MED ORDER — AMLODIPINE BESYLATE 5 MG PO TABS: 5.0000 mg | ORAL_TABLET | Freq: Every day | ORAL | 3 refills | Status: DC

## 2019-05-23 NOTE — Progress Notes (Signed)
   Follow up visit  Subjective:   Phillip Allen, male    DOB: 07-Aug-1940, 79 y.o.   MRN: [id number]   HPI  79 y.o. Caucasian male with hypertension, coronary artery disease s/p angioplasty in 2010, left bundle branch block, mild aortic ectasia, grade 2 diastolic dysfunction.  Blood pressure remains elevated. He denies any symptoms.   Current Outpatient Medications on File Prior to Visit  Medication Sig Dispense Refill  . aspirin (ASPIR-LOW) 81 MG EC tablet Take 81 mg by mouth daily.    . Boswellia-Glucosamine-Vit D (OSTEO BI-FLEX ONE PER DAY PO) Take by mouth. 2 daily    . Docusate Sodium (DSS) 100 MG CAPS 1 in the a.m. 2 at night    . DULoxetine (CYMBALTA) 60 MG capsule Take 60 mg by mouth daily.    Javier Docker Oil 500 MG CAPS Take by mouth daily.    . Multiple Vitamins-Minerals (MULTIVITAMIN MEN 50+ PO) Take by mouth daily.    . naproxen sodium (ALEVE) 220 MG tablet Take 220 mg by mouth as needed.    . rosuvastatin (CRESTOR) 40 MG tablet Take 40 mg by mouth daily.     No current facility-administered medications on file prior to visit.    Cardiovascular & other pertient studies:   EKG 04/25/2019: Sinus rhythm 69 bpm. First degree A-V block  Left bundle branch block.    Aorta duplex 03/10/2018: Maximum aorta diameter 2.65 cm.  Diffuse calcific plaque noted in proximal, mid, and distal aorta.  Mild abdominal aortic tic ectasia noted.  No significant change compared to previous study in 2018.  Follow-up when clinically indicated.  Echocardiogram 04/03/2017: EF 58%.  Abnormal septal motion due to left bundle branch block.  Grade 2 diastolic dysfunction.  Elevated left atrial pressure. Moderate left atrial dilatation. Mild aortic valve calcification.  Trace aortic stenosis. Mild to moderate mitral regurgitation. Mild to moderate tricuspid regurgitation. No evidence of pulmonary hypertension.  Recent labs: 01/25/2019: Glucose 122, BUN/Cr 10/0.7. EGFR 91. Na/K 139/5/0. Rest of  the CMP normal H/H 14/42. MCV 96. Platelets 296 HbA1C 5.9% Chol 162, TG 139, HDL 61, LDL 77 TSH 2.3 normal    Review of Systems  Cardiovascular: Negative for chest pain, dyspnea on exertion, leg swelling, palpitations and syncope.         Vitals:   05/23/19 1422  BP: (!) 145/74  Pulse: 64  SpO2: 98%     Body mass index is 26.73 kg/m. Filed Weights   05/23/19 1422  Weight: 181 lb (82.1 kg)     Objective:   Physical Exam  Constitutional: He appears well-developed and well-nourished.  Neck: No JVD present.  Cardiovascular: Normal rate, regular rhythm, normal heart sounds and intact distal pulses.  No murmur heard. Pulmonary/Chest: Effort normal and breath sounds normal. He has no wheezes. He has no rales.  Musculoskeletal:        General: No edema.  Nursing note and vitals reviewed.         Assessment & Recommendations:   79 y.o. Caucasian male with hypertension, coronary artery disease s/p angioplasty in 2010, left bundle branch block, mild aortic ectasia, grade 2 diastolic dysfunction.  1. Hypertension: Started amlodipine 5 mg daily. 4 wk VV .  2. Coronary artery disease involving native coronary artery of native heart without angina pectoris No symptoms. Continue Aspirin, statin. Reviewed 01/2019 labs with the patient, which are satisfactory.    [name], MD [facility] Pager: [phone] Office: [phone]

## 2019-05-26 DIAGNOSIS — Z20828 Contact with and (suspected) exposure to other viral communicable diseases: Secondary | ICD-10-CM | POA: Diagnosis not present

## 2019-05-26 DIAGNOSIS — Z03818 Encounter for observation for suspected exposure to other biological agents ruled out: Secondary | ICD-10-CM | POA: Diagnosis not present

## 2019-05-26 DIAGNOSIS — U071 COVID-19: Secondary | ICD-10-CM | POA: Diagnosis not present

## 2019-06-15 ENCOUNTER — Telehealth: Payer: MEDICARE | Admitting: Cardiology

## 2019-06-15 ENCOUNTER — Other Ambulatory Visit: Payer: Self-pay

## 2019-06-22 ENCOUNTER — Telehealth: Payer: MEDICARE | Admitting: Cardiology

## 2019-06-22 ENCOUNTER — Other Ambulatory Visit: Payer: Self-pay

## 2019-06-22 VITALS — BP 139/75

## 2019-06-22 DIAGNOSIS — I2510 Atherosclerotic heart disease of native coronary artery without angina pectoris: Secondary | ICD-10-CM | POA: Diagnosis not present

## 2019-06-22 DIAGNOSIS — I10 Essential (primary) hypertension: Secondary | ICD-10-CM

## 2019-06-22 MED ORDER — LOSARTAN POTASSIUM 50 MG PO TABS: 50.0000 mg | ORAL_TABLET | Freq: Every day | ORAL | 3 refills | Status: DC

## 2019-06-22 NOTE — Progress Notes (Signed)
Follow up visit  Subjective:   Phillip Allen, male    DOB: [birth year], 79 y.o.   MRN: [id number]  I connected with the patient on 06/22/2019 by a telephone call and verified that I am speaking with the correct person using two identifiers.     I offered the patient a video enabled application for a virtual visit. Unfortunately, this could not be accomplished due to technical difficulties/lack of video enabled phone/computer. I discussed the limitations of evaluation and management by telemedicine and the availability of in person appointments. The patient expressed understanding and agreed to proceed.   This visit type was conducted due to national recommendations for restrictions regarding the COVID-19 Pandemic (e.g. social distancing).  This format is felt to be most appropriate for this patient at this time.  All issues noted in this document were discussed and addressed.  No physical exam was performed (except for noted visual exam findings with Tele health visits).  The patient has consented to conduct a Tele health visit and understands insurance will be billed.   HPI  79 y.o. Caucasian male with hypertension, coronary artery disease s/p angioplasty in 2010, left bundle branch block, mild aortic ectasia, grade 2 diastolic dysfunction.  At last visit, I started him on amlodipine 5 mg daily. Blood pressure remains elevated.Lowest 135/71, highest 165/84.  Current Outpatient Medications on File Prior to Visit  Medication Sig Dispense Refill  . amLODipine (NORVASC) 5 MG tablet Take 1 tablet (5 mg total) by mouth daily. 30 tablet 3  . aspirin (ASPIR-LOW) 81 MG EC tablet Take 81 mg by mouth daily.    . Boswellia-Glucosamine-Vit D (OSTEO BI-FLEX ONE PER DAY PO) Take by mouth. 2 daily    . Docusate Sodium (DSS) 100 MG CAPS 1 in the a.m. 2 at night    . DULoxetine (CYMBALTA) 60 MG capsule Take 60 mg by mouth daily.    Javier Docker Oil 500 MG CAPS Take by mouth daily.    . Multiple Vitamins-Minerals  (MULTIVITAMIN MEN 50+ PO) Take by mouth daily.    . rosuvastatin (CRESTOR) 40 MG tablet Take 40 mg by mouth daily.     No current facility-administered medications on file prior to visit.    Cardiovascular & other pertient studies:   EKG 04/25/2019: Sinus rhythm 69 bpm. First degree A-V block  Left bundle branch block.    Aorta duplex 03/10/2018: Maximum aorta diameter 2.65 cm.  Diffuse calcific plaque noted in proximal, mid, and distal aorta.  Mild abdominal aortic tic ectasia noted.  No significant change compared to previous study in 2018.  Follow-up when clinically indicated.  Echocardiogram 04/03/2017: EF 58%.  Abnormal septal motion due to left bundle branch block.  Grade 2 diastolic dysfunction.  Elevated left atrial pressure. Moderate left atrial dilatation. Mild aortic valve calcification.  Trace aortic stenosis. Mild to moderate mitral regurgitation. Mild to moderate tricuspid regurgitation. No evidence of pulmonary hypertension.  Recent labs: 01/25/2019: Glucose 122, BUN/Cr 10/0.7. EGFR 91. Na/K 139/5/0. Rest of the CMP normal H/H 14/42. MCV 96. Platelets 296 HbA1C 5.9% Chol 162, TG 139, HDL 61, LDL 77 TSH 2.3 normal    Review of Systems  Cardiovascular: Negative for chest pain, dyspnea on exertion, leg swelling, palpitations and syncope.         Vitals:   06/21/19 1800 06/22/19 1416  BP: (!) 150/80 139/75      Objective:   Physical Exam  Not performed. Telephone visit.     Assessment & Recommendations:  79 y.o. Caucasian male with hypertension, coronary artery disease s/p angioplasty in 2010, left bundle branch block, mild aortic ectasia, grade 2 diastolic dysfunction.  1. Hypertension: Remains uncontrolled. Started losartan 50 mg daily. Continue amlodipine 5 mg daily.  Check BMP in 1 week.   2. Coronary artery disease involving native coronary artery of native heart without angina pectoris No symptoms. Continue Aspirin, statin.  F/u BP with  PCP in April. F/u w/me in August.  [name], MD [facility] Pager: [phone] Office: [phone]

## 2019-07-11 ENCOUNTER — Other Ambulatory Visit (HOSPITAL_COMMUNITY): Payer: Self-pay | Admitting: Cardiology

## 2019-07-11 DIAGNOSIS — I10 Essential (primary) hypertension: Secondary | ICD-10-CM | POA: Diagnosis not present

## 2019-07-12 LAB — BASIC METABOLIC PANEL
BUN/Creatinine Ratio: 20 (ref 10–24)
BUN: 17 mg/dL (ref 8–27)
CO2: 25 mmol/L (ref 20–29)
Calcium: 9.9 mg/dL (ref 8.6–10.2)
Chloride: 102 mmol/L (ref 96–106)
Creatinine, Ser: 0.87 mg/dL (ref 0.76–1.27)
GFR calc Af Amer: 97 mL/min/{1.73_m2} (ref >59)
GFR calc non Af Amer: 84 mL/min/{1.73_m2} (ref >59)
Glucose: 158 mg/dL — ABNORMAL HIGH (ref 65–99)
Potassium: 4.9 mmol/L (ref 3.5–5.2)
Sodium: 141 mmol/L (ref 134–144)

## 2019-07-26 DIAGNOSIS — D1801 Hemangioma of skin and subcutaneous tissue: Secondary | ICD-10-CM | POA: Diagnosis not present

## 2019-07-26 DIAGNOSIS — C44612 Basal cell carcinoma of skin of right upper limb, including shoulder: Secondary | ICD-10-CM | POA: Diagnosis not present

## 2019-07-26 DIAGNOSIS — L570 Actinic keratosis: Secondary | ICD-10-CM | POA: Diagnosis not present

## 2019-07-26 DIAGNOSIS — D225 Melanocytic nevi of trunk: Secondary | ICD-10-CM | POA: Diagnosis not present

## 2019-07-26 DIAGNOSIS — L821 Other seborrheic keratosis: Secondary | ICD-10-CM | POA: Diagnosis not present

## 2019-07-26 DIAGNOSIS — L814 Other melanin hyperpigmentation: Secondary | ICD-10-CM | POA: Diagnosis not present

## 2019-07-26 DIAGNOSIS — L853 Xerosis cutis: Secondary | ICD-10-CM | POA: Diagnosis not present

## 2019-07-26 DIAGNOSIS — D485 Neoplasm of uncertain behavior of skin: Secondary | ICD-10-CM | POA: Diagnosis not present

## 2019-07-26 DIAGNOSIS — Z85828 Personal history of other malignant neoplasm of skin: Secondary | ICD-10-CM | POA: Diagnosis not present

## 2019-08-15 ENCOUNTER — Other Ambulatory Visit: Payer: Self-pay

## 2019-08-15 ENCOUNTER — Other Ambulatory Visit: Payer: Self-pay | Admitting: Cardiology

## 2019-08-15 DIAGNOSIS — I10 Essential (primary) hypertension: Secondary | ICD-10-CM

## 2019-08-15 MED ORDER — AMLODIPINE BESYLATE 5 MG PO TABS: 5.0000 mg | ORAL_TABLET | Freq: Every day | ORAL | 0 refills | Status: DC

## 2019-08-16 DIAGNOSIS — E118 Type 2 diabetes mellitus with unspecified complications: Secondary | ICD-10-CM | POA: Diagnosis not present

## 2019-08-17 DIAGNOSIS — Z6827 Body mass index (BMI) 27.0-27.9, adult: Secondary | ICD-10-CM | POA: Diagnosis not present

## 2019-08-17 DIAGNOSIS — F331 Major depressive disorder, recurrent, moderate: Secondary | ICD-10-CM | POA: Diagnosis not present

## 2019-08-17 DIAGNOSIS — Z0000 Encounter for general adult medical examination without abnormal findings: Secondary | ICD-10-CM | POA: Diagnosis not present

## 2019-08-17 DIAGNOSIS — E118 Type 2 diabetes mellitus with unspecified complications: Secondary | ICD-10-CM | POA: Diagnosis not present

## 2019-08-17 DIAGNOSIS — F411 Generalized anxiety disorder: Secondary | ICD-10-CM | POA: Diagnosis not present

## 2019-08-22 ENCOUNTER — Other Ambulatory Visit: Payer: Self-pay

## 2019-08-22 DIAGNOSIS — I10 Essential (primary) hypertension: Secondary | ICD-10-CM

## 2019-08-22 MED ORDER — LOSARTAN POTASSIUM 50 MG PO TABS: 50.0000 mg | ORAL_TABLET | Freq: Every day | ORAL | 3 refills | Status: DC

## 2019-09-08 ENCOUNTER — Other Ambulatory Visit: Payer: Self-pay

## 2019-09-08 DIAGNOSIS — I10 Essential (primary) hypertension: Secondary | ICD-10-CM

## 2019-09-08 MED ORDER — AMLODIPINE BESYLATE 5 MG PO TABS: 5.0000 mg | ORAL_TABLET | Freq: Every day | ORAL | 3 refills | Status: DC

## 2019-09-08 NOTE — Telephone Encounter (Signed)
Patient requesting refill to mail order pharmacy

## 2019-09-16 ENCOUNTER — Other Ambulatory Visit: Payer: Self-pay | Admitting: Cardiology

## 2019-09-16 DIAGNOSIS — I10 Essential (primary) hypertension: Secondary | ICD-10-CM

## 2019-12-13 DIAGNOSIS — E1165 Type 2 diabetes mellitus with hyperglycemia: Secondary | ICD-10-CM | POA: Diagnosis not present

## 2019-12-13 DIAGNOSIS — F411 Generalized anxiety disorder: Secondary | ICD-10-CM | POA: Diagnosis not present

## 2019-12-13 DIAGNOSIS — I10 Essential (primary) hypertension: Secondary | ICD-10-CM | POA: Diagnosis not present

## 2019-12-23 ENCOUNTER — Ambulatory Visit: Payer: MEDICARE | Admitting: Cardiology

## 2019-12-23 ENCOUNTER — Encounter: Payer: Self-pay | Admitting: Cardiology

## 2019-12-23 ENCOUNTER — Other Ambulatory Visit: Payer: Self-pay

## 2019-12-23 VITALS — BP 136/75 | HR 79 | Resp 17 | Ht 69.0 in | Wt 186.0 lb

## 2019-12-23 DIAGNOSIS — I2510 Atherosclerotic heart disease of native coronary artery without angina pectoris: Secondary | ICD-10-CM

## 2019-12-23 DIAGNOSIS — I10 Essential (primary) hypertension: Secondary | ICD-10-CM

## 2019-12-23 NOTE — Progress Notes (Signed)
Follow up visit  Subjective:   Phillip Allen, male    DOB: 01/03/1941, 79 y.o.   MRN: [id number]   HPI  79 y.o. Caucasian male with hypertension, CAD (PCI in 2010), mild aortic ectasia,   Patients is doing well, denies any cardiac complaints. He has been dealing with right knee pain and is going to undergo corticosteroid injections. Blood pressure is well controlled after change made at the last visit   Current Outpatient Medications on File Prior to Visit  Medication Sig Dispense Refill   amLODipine (NORVASC) 5 MG tablet Take 1 tablet (5 mg total) by mouth daily. 90 tablet 3   aspirin (ASPIR-LOW) 81 MG EC tablet Take 81 mg by mouth daily.     Boswellia-Glucosamine-Vit D (OSTEO BI-FLEX ONE PER DAY PO) Take by mouth. 2 daily     Docusate Sodium (DSS) 100 MG CAPS 1 in the a.m. 2 at night     DULoxetine (CYMBALTA) 60 MG capsule Take 60 mg by mouth daily.     Krill Oil 500 MG CAPS Take by mouth daily.     losartan (COZAAR) 50 MG tablet TAKE 1 TABLET (50 MG TOTAL) BY MOUTH DAILY. 90 tablet 1   Multiple Vitamins-Minerals (MULTIVITAMIN MEN 50+ PO) Take by mouth daily.     rosuvastatin (CRESTOR) 40 MG tablet Take 40 mg by mouth daily.     No current facility-administered medications on file prior to visit.    Cardiovascular & other pertient studies:  EKG 12/23/2019: Sinus rhythm 77 bpm First degree A-V block  Left bundle branch block and left axis.  Inferior infarct -age undetermined.   EKG 04/25/2019: Sinus rhythm 69 bpm. First degree A-V block  Left bundle branch block.    Aorta duplex 03/10/2018: Maximum aorta diameter 2.65 cm.  Diffuse calcific plaque noted in proximal, mid, and distal aorta.  Mild abdominal aortic tic ectasia noted.  No significant change compared to previous study in 2018.  Follow-up when clinically indicated.  Echocardiogram 04/03/2017: EF 58%.  Abnormal septal motion due to left bundle branch block.  Grade 2 diastolic dysfunction.  Elevated left  atrial pressure. Moderate left atrial dilatation. Mild aortic valve calcification.  Trace aortic stenosis. Mild to moderate mitral regurgitation. Mild to moderate tricuspid regurgitation. No evidence of pulmonary hypertension.  Recent labs: 07/11/2019: Glucose 158, BUN/Cr 17/0.8. EGFR 84 HbA1C 6.2%  01/25/2019: Glucose 122, BUN/Cr 10/0.7. EGFR 91. Na/K 139/5/0. Rest of the CMP normal H/H 14/42. MCV 96. Platelets 296 HbA1C 5.9% Chol 162, TG 139, HDL 61, LDL 77 TSH 2.3 normal    Review of Systems  Cardiovascular: Negative for chest pain, dyspnea on exertion, leg swelling, palpitations and syncope.         Vitals:   12/23/19 1344  BP: 136/75  Pulse: 79  Resp: 17  SpO2: 98%      Objective:   Physical Exam Vitals and nursing note reviewed.  Constitutional:      General: He is not in acute distress. Neck:     Vascular: No JVD.  Cardiovascular:     Rate and Rhythm: Normal rate and regular rhythm.     Heart sounds: Normal heart sounds. No murmur heard.   Pulmonary:     Effort: Pulmonary effort is normal.     Breath sounds: Normal breath sounds. No wheezing or rales.       Assessment & Recommendations:   79 y.o. Caucasian male with hypertension, CAD (PCI in 2010), mild aortic ectasia  Hypertension: Better  controlled on losartan 50, amlodipine 5,  CAD: No symptoms. Continue Aspirin, statin.  Fu in 1 year  [name], MD [facility] Pager: [phone] Office: [phone]

## 2019-12-24 ENCOUNTER — Encounter: Payer: Self-pay | Admitting: Cardiology

## 2020-01-02 DIAGNOSIS — E118 Type 2 diabetes mellitus with unspecified complications: Secondary | ICD-10-CM | POA: Diagnosis not present

## 2020-01-03 DIAGNOSIS — Z23 Encounter for immunization: Secondary | ICD-10-CM | POA: Diagnosis not present

## 2020-01-05 ENCOUNTER — Other Ambulatory Visit: Payer: Self-pay | Admitting: Family Medicine

## 2020-01-05 ENCOUNTER — Ambulatory Visit
Admission: RE | Admit: 2020-01-05 | Discharge: 2020-01-05 | Disposition: A | Discharge status: Home / Self Care | Payer: MEDICARE | Source: Ambulatory Visit | Attending: Family Medicine | Admitting: Family Medicine

## 2020-01-05 ENCOUNTER — Other Ambulatory Visit: Payer: Self-pay

## 2020-01-05 DIAGNOSIS — M170 Bilateral primary osteoarthritis of knee: Secondary | ICD-10-CM

## 2020-01-05 DIAGNOSIS — M1712 Unilateral primary osteoarthritis, left knee: Secondary | ICD-10-CM | POA: Diagnosis not present

## 2020-01-05 DIAGNOSIS — M1711 Unilateral primary osteoarthritis, right knee: Secondary | ICD-10-CM | POA: Diagnosis not present

## 2020-01-24 DIAGNOSIS — D1801 Hemangioma of skin and subcutaneous tissue: Secondary | ICD-10-CM | POA: Diagnosis not present

## 2020-01-24 DIAGNOSIS — D485 Neoplasm of uncertain behavior of skin: Secondary | ICD-10-CM | POA: Diagnosis not present

## 2020-01-24 DIAGNOSIS — L570 Actinic keratosis: Secondary | ICD-10-CM | POA: Diagnosis not present

## 2020-01-24 DIAGNOSIS — Z85828 Personal history of other malignant neoplasm of skin: Secondary | ICD-10-CM | POA: Diagnosis not present

## 2020-01-24 DIAGNOSIS — C44319 Basal cell carcinoma of skin of other parts of face: Secondary | ICD-10-CM | POA: Diagnosis not present

## 2020-01-24 DIAGNOSIS — D2272 Melanocytic nevi of left lower limb, including hip: Secondary | ICD-10-CM | POA: Diagnosis not present

## 2020-01-24 DIAGNOSIS — B351 Tinea unguium: Secondary | ICD-10-CM | POA: Diagnosis not present

## 2020-01-24 DIAGNOSIS — D225 Melanocytic nevi of trunk: Secondary | ICD-10-CM | POA: Diagnosis not present

## 2020-01-24 DIAGNOSIS — L918 Other hypertrophic disorders of the skin: Secondary | ICD-10-CM | POA: Diagnosis not present

## 2020-01-24 DIAGNOSIS — L814 Other melanin hyperpigmentation: Secondary | ICD-10-CM | POA: Diagnosis not present

## 2020-01-24 DIAGNOSIS — L821 Other seborrheic keratosis: Secondary | ICD-10-CM | POA: Diagnosis not present

## 2020-02-01 NOTE — Addendum Note (Signed)
Encounter addended by: Ambrose Mantle on: 02/01/2020 2:17 PM  Actions taken: Imaging Exam ended

## 2020-02-07 DIAGNOSIS — Z23 Encounter for immunization: Secondary | ICD-10-CM | POA: Diagnosis not present

## 2020-02-16 DIAGNOSIS — Z125 Encounter for screening for malignant neoplasm of prostate: Secondary | ICD-10-CM | POA: Diagnosis not present

## 2020-02-16 DIAGNOSIS — E118 Type 2 diabetes mellitus with unspecified complications: Secondary | ICD-10-CM | POA: Diagnosis not present

## 2020-02-16 DIAGNOSIS — E782 Mixed hyperlipidemia: Secondary | ICD-10-CM | POA: Diagnosis not present

## 2020-02-21 DIAGNOSIS — E1165 Type 2 diabetes mellitus with hyperglycemia: Secondary | ICD-10-CM | POA: Diagnosis not present

## 2020-02-21 DIAGNOSIS — E1169 Type 2 diabetes mellitus with other specified complication: Secondary | ICD-10-CM | POA: Diagnosis not present

## 2020-02-21 DIAGNOSIS — I10 Essential (primary) hypertension: Secondary | ICD-10-CM | POA: Diagnosis not present

## 2020-02-21 DIAGNOSIS — C44319 Basal cell carcinoma of skin of other parts of face: Secondary | ICD-10-CM | POA: Diagnosis not present

## 2020-02-21 DIAGNOSIS — E1159 Type 2 diabetes mellitus with other circulatory complications: Secondary | ICD-10-CM | POA: Diagnosis not present

## 2020-02-21 DIAGNOSIS — Z85828 Personal history of other malignant neoplasm of skin: Secondary | ICD-10-CM | POA: Diagnosis not present

## 2020-02-21 DIAGNOSIS — E782 Mixed hyperlipidemia: Secondary | ICD-10-CM | POA: Diagnosis not present

## 2020-02-22 DIAGNOSIS — M170 Bilateral primary osteoarthritis of knee: Secondary | ICD-10-CM | POA: Diagnosis not present

## 2020-02-28 DIAGNOSIS — M170 Bilateral primary osteoarthritis of knee: Secondary | ICD-10-CM | POA: Diagnosis not present

## 2020-03-02 DIAGNOSIS — L570 Actinic keratosis: Secondary | ICD-10-CM | POA: Diagnosis not present

## 2020-03-05 DIAGNOSIS — Z23 Encounter for immunization: Secondary | ICD-10-CM | POA: Diagnosis not present

## 2020-03-14 DIAGNOSIS — M170 Bilateral primary osteoarthritis of knee: Secondary | ICD-10-CM | POA: Diagnosis not present

## 2020-03-14 DIAGNOSIS — G4700 Insomnia, unspecified: Secondary | ICD-10-CM | POA: Diagnosis not present

## 2020-03-14 DIAGNOSIS — F331 Major depressive disorder, recurrent, moderate: Secondary | ICD-10-CM | POA: Diagnosis not present

## 2020-03-14 DIAGNOSIS — F411 Generalized anxiety disorder: Secondary | ICD-10-CM | POA: Diagnosis not present

## 2020-04-17 DIAGNOSIS — E875 Hyperkalemia: Secondary | ICD-10-CM | POA: Diagnosis not present

## 2020-04-17 DIAGNOSIS — E118 Type 2 diabetes mellitus with unspecified complications: Secondary | ICD-10-CM | POA: Diagnosis not present

## 2020-04-17 DIAGNOSIS — E782 Mixed hyperlipidemia: Secondary | ICD-10-CM | POA: Diagnosis not present

## 2020-05-10 DIAGNOSIS — C4442 Squamous cell carcinoma of skin of scalp and neck: Secondary | ICD-10-CM | POA: Diagnosis not present

## 2020-07-22 NOTE — Progress Notes (Addendum)
Follow up visit  Subjective:   Ferd Horrigan, male    DOB: [birth year], 80 y.o.   MRN: [id number]   HPI  80 y.o. Caucasian male with hypertension, CAD (PCI in 2010), mild aortic ectasia   Patient has had symptoms of "hollowness in the chest" on exertion for past 2-3 months. He has had gradual worsening. Most recent episode occurred while lifting 5 gallons water. He also has these symptoms while walking uphill. He had similar episodes in 2010.  Patient tells me that he underwent coronary angiography in [facility] and tells me that they were "unable to cross". However, his symptoms were well controlled over the years with Aspirin, statin, amlodipine.    Current Outpatient Medications on File Prior to Visit  Medication Sig Dispense Refill  . amLODipine (NORVASC) 5 MG tablet Take 1 tablet (5 mg total) by mouth daily. 90 tablet 3  . aspirin (ASPIR-LOW) 81 MG EC tablet Take 81 mg by mouth daily.    . Boswellia-Glucosamine-Vit D (OSTEO BI-FLEX ONE PER DAY PO) Take by mouth. 2 daily    . diclofenac Sodium (VOLTAREN) 1 % GEL Apply 1 application topically 4 (four) times daily.    Mariane Baumgarten Sodium (DSS) 100 MG CAPS 1 in the a.m. 2 at night    . DULoxetine (CYMBALTA) 60 MG capsule Take 60 mg by mouth daily.    Javier Docker Oil 500 MG CAPS Take by mouth daily.    Marland Kitchen losartan (COZAAR) 50 MG tablet TAKE 1 TABLET (50 MG TOTAL) BY MOUTH DAILY. 90 tablet 1  . Multiple Vitamins-Minerals (MULTIVITAMIN MEN 50+ PO) Take by mouth daily.    . rosuvastatin (CRESTOR) 40 MG tablet Take 40 mg by mouth daily.     No current facility-administered medications on file prior to visit.    Cardiovascular & other pertient studies:  EKG 12/23/2019: Sinus rhythm 77 bpm First degree A-V block  Left bundle branch block and left axis.  Inferior infarct -age undetermined.   Aorta duplex 03/10/2018: Maximum aorta diameter 2.65 cm.  Diffuse calcific plaque noted in proximal, mid, and distal aorta.  Mild abdominal aortic tic  ectasia noted.  No significant change compared to previous study in 2018.  Follow-up when clinically indicated.  Echocardiogram 04/03/2017: EF 58%.  Abnormal septal motion due to left bundle branch block.  Grade 2 diastolic dysfunction.  Elevated left atrial pressure. Moderate left atrial dilatation. Mild aortic valve calcification.  Trace aortic stenosis. Mild to moderate mitral regurgitation. Mild to moderate tricuspid regurgitation. No evidence of pulmonary hypertension.  Recent labs: 07/11/2019: Glucose 158, BUN/Cr 17/0.8. EGFR 84 HbA1C 6.2%  01/25/2019: Glucose 122, BUN/Cr 10/0.7. EGFR 91. Na/K 139/5/0. Rest of the CMP normal H/H 14/42. MCV 96. Platelets 296 HbA1C 5.9% Chol 162, TG 139, HDL 61, LDL 77 TSH 2.3 normal    Review of Systems  Cardiovascular: Positive for chest pain (Angina equivalent, as per HPI). Negative for dyspnea on exertion, leg swelling, palpitations and syncope.         Vitals:   07/23/20 1056  BP: 129/78  Pulse: 66  Resp: 16  Temp: 98.5 F (36.9 C)  SpO2: 97%      Objective:   Physical Exam Vitals and nursing note reviewed.  Constitutional:      General: He is not in acute distress. Neck:     Vascular: No JVD.  Cardiovascular:     Rate and Rhythm: Normal rate and regular rhythm.     Heart sounds: Normal heart sounds.  No murmur heard.   Pulmonary:     Effort: Pulmonary effort is normal.     Breath sounds: Normal breath sounds. No wheezing or rales.       Assessment & Recommendations:   80 y.o. Caucasian male with hypertension, CAD (PCI in 2010), mild aortic ectasia  Angina: Known CAD with recurrence of typical angina symptoms. Continue current medical therapy. Added metoprolol succinate 25 mg daily, SL NTG as needed Will obtain lexiscan nuclear stress test and plan for coronary angiography, possible intervention. Check CBC, BMP, lipid panel.  Hypertension: Well controlled  F/u after tests, cath  [name],  MD [facility] Pager: [phone] Office: [phone]

## 2020-07-23 ENCOUNTER — Ambulatory Visit: Payer: MEDICARE | Admitting: Cardiology

## 2020-07-23 ENCOUNTER — Encounter: Payer: Self-pay | Admitting: Cardiology

## 2020-07-23 ENCOUNTER — Other Ambulatory Visit: Payer: Self-pay

## 2020-07-23 VITALS — BP 129/78 | HR 66 | Temp 98.5°F | Resp 16 | Ht 69.0 in | Wt 180.2 lb

## 2020-07-23 DIAGNOSIS — I2510 Atherosclerotic heart disease of native coronary artery without angina pectoris: Secondary | ICD-10-CM

## 2020-07-23 DIAGNOSIS — I10 Essential (primary) hypertension: Secondary | ICD-10-CM

## 2020-07-23 DIAGNOSIS — I25118 Atherosclerotic heart disease of native coronary artery with other forms of angina pectoris: Secondary | ICD-10-CM

## 2020-07-23 MED ORDER — NITROGLYCERIN 0.4 MG SL SUBL: 0.4000 mg | SUBLINGUAL_TABLET | SUBLINGUAL | 3 refills | Status: DC | PRN

## 2020-07-23 MED ORDER — METOPROLOL SUCCINATE ER 25 MG PO TB24: 25.0000 mg | ORAL_TABLET | Freq: Every day | ORAL | 3 refills | Status: DC

## 2020-07-24 DIAGNOSIS — I25118 Atherosclerotic heart disease of native coronary artery with other forms of angina pectoris: Secondary | ICD-10-CM | POA: Diagnosis not present

## 2020-07-25 DIAGNOSIS — M9903 Segmental and somatic dysfunction of lumbar region: Secondary | ICD-10-CM | POA: Diagnosis not present

## 2020-07-25 DIAGNOSIS — M9902 Segmental and somatic dysfunction of thoracic region: Secondary | ICD-10-CM | POA: Diagnosis not present

## 2020-07-25 DIAGNOSIS — M9904 Segmental and somatic dysfunction of sacral region: Secondary | ICD-10-CM | POA: Diagnosis not present

## 2020-07-25 DIAGNOSIS — M5136 Other intervertebral disc degeneration, lumbar region: Secondary | ICD-10-CM | POA: Diagnosis not present

## 2020-07-25 DIAGNOSIS — M9901 Segmental and somatic dysfunction of cervical region: Secondary | ICD-10-CM | POA: Diagnosis not present

## 2020-07-25 LAB — CBC
Hematocrit: 42.2 % (ref 37.5–51.0)
Hemoglobin: 14.2 g/dL (ref 13.0–17.7)
MCH: 31.2 pg (ref 26.6–33.0)
MCHC: 33.6 g/dL (ref 31.5–35.7)
MCV: 93 fL (ref 79–97)
Platelets: 274 10*3/uL (ref 150–450)
RBC: 4.55 x10E6/uL (ref 4.14–5.80)
RDW: 12.9 % (ref 11.6–15.4)
WBC: 8.5 10*3/uL (ref 3.4–10.8)

## 2020-07-27 DIAGNOSIS — M9903 Segmental and somatic dysfunction of lumbar region: Secondary | ICD-10-CM | POA: Diagnosis not present

## 2020-07-27 DIAGNOSIS — M9902 Segmental and somatic dysfunction of thoracic region: Secondary | ICD-10-CM | POA: Diagnosis not present

## 2020-07-27 DIAGNOSIS — M9901 Segmental and somatic dysfunction of cervical region: Secondary | ICD-10-CM | POA: Diagnosis not present

## 2020-07-27 DIAGNOSIS — M9904 Segmental and somatic dysfunction of sacral region: Secondary | ICD-10-CM | POA: Diagnosis not present

## 2020-07-27 DIAGNOSIS — M5136 Other intervertebral disc degeneration, lumbar region: Secondary | ICD-10-CM | POA: Diagnosis not present

## 2020-07-30 DIAGNOSIS — M9901 Segmental and somatic dysfunction of cervical region: Secondary | ICD-10-CM | POA: Diagnosis not present

## 2020-07-30 DIAGNOSIS — M9903 Segmental and somatic dysfunction of lumbar region: Secondary | ICD-10-CM | POA: Diagnosis not present

## 2020-07-30 DIAGNOSIS — M9902 Segmental and somatic dysfunction of thoracic region: Secondary | ICD-10-CM | POA: Diagnosis not present

## 2020-07-30 DIAGNOSIS — M5136 Other intervertebral disc degeneration, lumbar region: Secondary | ICD-10-CM | POA: Diagnosis not present

## 2020-07-30 DIAGNOSIS — M9904 Segmental and somatic dysfunction of sacral region: Secondary | ICD-10-CM | POA: Diagnosis not present

## 2020-08-01 ENCOUNTER — Ambulatory Visit: Payer: MEDICARE

## 2020-08-01 ENCOUNTER — Other Ambulatory Visit: Payer: Self-pay

## 2020-08-01 DIAGNOSIS — M9902 Segmental and somatic dysfunction of thoracic region: Secondary | ICD-10-CM | POA: Diagnosis not present

## 2020-08-01 DIAGNOSIS — M9904 Segmental and somatic dysfunction of sacral region: Secondary | ICD-10-CM | POA: Diagnosis not present

## 2020-08-01 DIAGNOSIS — I25118 Atherosclerotic heart disease of native coronary artery with other forms of angina pectoris: Secondary | ICD-10-CM | POA: Diagnosis not present

## 2020-08-01 DIAGNOSIS — M9903 Segmental and somatic dysfunction of lumbar region: Secondary | ICD-10-CM | POA: Diagnosis not present

## 2020-08-01 DIAGNOSIS — M9901 Segmental and somatic dysfunction of cervical region: Secondary | ICD-10-CM | POA: Diagnosis not present

## 2020-08-01 DIAGNOSIS — M5136 Other intervertebral disc degeneration, lumbar region: Secondary | ICD-10-CM | POA: Diagnosis not present

## 2020-08-06 ENCOUNTER — Ambulatory Visit: Payer: MEDICARE

## 2020-08-06 ENCOUNTER — Other Ambulatory Visit: Payer: Self-pay

## 2020-08-06 DIAGNOSIS — I25118 Atherosclerotic heart disease of native coronary artery with other forms of angina pectoris: Secondary | ICD-10-CM

## 2020-08-06 DIAGNOSIS — M9904 Segmental and somatic dysfunction of sacral region: Secondary | ICD-10-CM | POA: Diagnosis not present

## 2020-08-06 DIAGNOSIS — M9903 Segmental and somatic dysfunction of lumbar region: Secondary | ICD-10-CM | POA: Diagnosis not present

## 2020-08-06 DIAGNOSIS — M9901 Segmental and somatic dysfunction of cervical region: Secondary | ICD-10-CM | POA: Diagnosis not present

## 2020-08-06 DIAGNOSIS — M5136 Other intervertebral disc degeneration, lumbar region: Secondary | ICD-10-CM | POA: Diagnosis not present

## 2020-08-06 DIAGNOSIS — M9902 Segmental and somatic dysfunction of thoracic region: Secondary | ICD-10-CM | POA: Diagnosis not present

## 2020-08-09 DIAGNOSIS — L570 Actinic keratosis: Secondary | ICD-10-CM | POA: Diagnosis not present

## 2020-08-09 DIAGNOSIS — Z85828 Personal history of other malignant neoplasm of skin: Secondary | ICD-10-CM | POA: Diagnosis not present

## 2020-08-09 DIAGNOSIS — L720 Epidermal cyst: Secondary | ICD-10-CM | POA: Diagnosis not present

## 2020-08-10 DIAGNOSIS — M9902 Segmental and somatic dysfunction of thoracic region: Secondary | ICD-10-CM | POA: Diagnosis not present

## 2020-08-10 DIAGNOSIS — M9904 Segmental and somatic dysfunction of sacral region: Secondary | ICD-10-CM | POA: Diagnosis not present

## 2020-08-10 DIAGNOSIS — M9903 Segmental and somatic dysfunction of lumbar region: Secondary | ICD-10-CM | POA: Diagnosis not present

## 2020-08-10 DIAGNOSIS — M5136 Other intervertebral disc degeneration, lumbar region: Secondary | ICD-10-CM | POA: Diagnosis not present

## 2020-08-10 DIAGNOSIS — Z23 Encounter for immunization: Secondary | ICD-10-CM | POA: Diagnosis not present

## 2020-08-10 DIAGNOSIS — M9901 Segmental and somatic dysfunction of cervical region: Secondary | ICD-10-CM | POA: Diagnosis not present

## 2020-08-10 NOTE — Progress Notes (Signed)
Called and spoke with patient. He said he recevied the message Dr. Virgina Jock left him on the VM.

## 2020-08-13 DIAGNOSIS — M9901 Segmental and somatic dysfunction of cervical region: Secondary | ICD-10-CM | POA: Diagnosis not present

## 2020-08-13 DIAGNOSIS — M5136 Other intervertebral disc degeneration, lumbar region: Secondary | ICD-10-CM | POA: Diagnosis not present

## 2020-08-13 DIAGNOSIS — M9903 Segmental and somatic dysfunction of lumbar region: Secondary | ICD-10-CM | POA: Diagnosis not present

## 2020-08-13 DIAGNOSIS — M9904 Segmental and somatic dysfunction of sacral region: Secondary | ICD-10-CM | POA: Diagnosis not present

## 2020-08-13 DIAGNOSIS — M9902 Segmental and somatic dysfunction of thoracic region: Secondary | ICD-10-CM | POA: Diagnosis not present

## 2020-08-17 DIAGNOSIS — M5136 Other intervertebral disc degeneration, lumbar region: Secondary | ICD-10-CM | POA: Diagnosis not present

## 2020-08-17 DIAGNOSIS — M9901 Segmental and somatic dysfunction of cervical region: Secondary | ICD-10-CM | POA: Diagnosis not present

## 2020-08-17 DIAGNOSIS — M9902 Segmental and somatic dysfunction of thoracic region: Secondary | ICD-10-CM | POA: Diagnosis not present

## 2020-08-17 DIAGNOSIS — M9904 Segmental and somatic dysfunction of sacral region: Secondary | ICD-10-CM | POA: Diagnosis not present

## 2020-08-17 DIAGNOSIS — M9903 Segmental and somatic dysfunction of lumbar region: Secondary | ICD-10-CM | POA: Diagnosis not present

## 2020-08-20 DIAGNOSIS — Z125 Encounter for screening for malignant neoplasm of prostate: Secondary | ICD-10-CM | POA: Diagnosis not present

## 2020-08-22 DIAGNOSIS — M5136 Other intervertebral disc degeneration, lumbar region: Secondary | ICD-10-CM | POA: Diagnosis not present

## 2020-08-22 DIAGNOSIS — M9901 Segmental and somatic dysfunction of cervical region: Secondary | ICD-10-CM | POA: Diagnosis not present

## 2020-08-22 DIAGNOSIS — I10 Essential (primary) hypertension: Secondary | ICD-10-CM | POA: Diagnosis not present

## 2020-08-22 DIAGNOSIS — Z1331 Encounter for screening for depression: Secondary | ICD-10-CM | POA: Diagnosis not present

## 2020-08-22 DIAGNOSIS — Z0000 Encounter for general adult medical examination without abnormal findings: Secondary | ICD-10-CM | POA: Diagnosis not present

## 2020-08-22 DIAGNOSIS — M9902 Segmental and somatic dysfunction of thoracic region: Secondary | ICD-10-CM | POA: Diagnosis not present

## 2020-08-22 DIAGNOSIS — Z1339 Encounter for screening examination for other mental health and behavioral disorders: Secondary | ICD-10-CM | POA: Diagnosis not present

## 2020-08-22 DIAGNOSIS — M9903 Segmental and somatic dysfunction of lumbar region: Secondary | ICD-10-CM | POA: Diagnosis not present

## 2020-08-22 DIAGNOSIS — F322 Major depressive disorder, single episode, severe without psychotic features: Secondary | ICD-10-CM | POA: Diagnosis not present

## 2020-08-22 DIAGNOSIS — I2510 Atherosclerotic heart disease of native coronary artery without angina pectoris: Secondary | ICD-10-CM | POA: Diagnosis not present

## 2020-08-22 DIAGNOSIS — M9904 Segmental and somatic dysfunction of sacral region: Secondary | ICD-10-CM | POA: Diagnosis not present

## 2020-08-24 DIAGNOSIS — M9904 Segmental and somatic dysfunction of sacral region: Secondary | ICD-10-CM | POA: Diagnosis not present

## 2020-08-24 DIAGNOSIS — M9903 Segmental and somatic dysfunction of lumbar region: Secondary | ICD-10-CM | POA: Diagnosis not present

## 2020-08-24 DIAGNOSIS — M5136 Other intervertebral disc degeneration, lumbar region: Secondary | ICD-10-CM | POA: Diagnosis not present

## 2020-08-24 DIAGNOSIS — M9901 Segmental and somatic dysfunction of cervical region: Secondary | ICD-10-CM | POA: Diagnosis not present

## 2020-08-24 DIAGNOSIS — M9902 Segmental and somatic dysfunction of thoracic region: Secondary | ICD-10-CM | POA: Diagnosis not present

## 2020-09-04 DIAGNOSIS — Z23 Encounter for immunization: Secondary | ICD-10-CM | POA: Diagnosis not present

## 2020-09-07 DIAGNOSIS — L570 Actinic keratosis: Secondary | ICD-10-CM | POA: Diagnosis not present

## 2020-10-11 DIAGNOSIS — F411 Generalized anxiety disorder: Secondary | ICD-10-CM | POA: Diagnosis not present

## 2020-10-11 DIAGNOSIS — F331 Major depressive disorder, recurrent, moderate: Secondary | ICD-10-CM | POA: Diagnosis not present

## 2020-10-11 DIAGNOSIS — I10 Essential (primary) hypertension: Secondary | ICD-10-CM | POA: Diagnosis not present

## 2020-10-11 DIAGNOSIS — E118 Type 2 diabetes mellitus with unspecified complications: Secondary | ICD-10-CM | POA: Diagnosis not present

## 2020-10-11 DIAGNOSIS — G4700 Insomnia, unspecified: Secondary | ICD-10-CM | POA: Diagnosis not present

## 2020-10-24 ENCOUNTER — Ambulatory Visit
Admission: RE | Admit: 2020-10-24 | Discharge: 2020-10-24 | Disposition: A | Discharge status: Home / Self Care | Payer: MEDICARE | Source: Ambulatory Visit | Attending: Family Medicine | Admitting: Family Medicine

## 2020-10-24 ENCOUNTER — Other Ambulatory Visit: Payer: Self-pay | Admitting: Family Medicine

## 2020-10-24 DIAGNOSIS — M1712 Unilateral primary osteoarthritis, left knee: Secondary | ICD-10-CM | POA: Diagnosis not present

## 2020-10-24 DIAGNOSIS — M1711 Unilateral primary osteoarthritis, right knee: Secondary | ICD-10-CM | POA: Diagnosis not present

## 2020-10-24 DIAGNOSIS — L309 Dermatitis, unspecified: Secondary | ICD-10-CM | POA: Diagnosis not present

## 2020-10-24 DIAGNOSIS — M170 Bilateral primary osteoarthritis of knee: Secondary | ICD-10-CM

## 2020-10-24 DIAGNOSIS — M545 Low back pain, unspecified: Secondary | ICD-10-CM | POA: Diagnosis not present

## 2020-10-24 DIAGNOSIS — M25561 Pain in right knee: Secondary | ICD-10-CM | POA: Diagnosis not present

## 2020-11-08 DIAGNOSIS — L570 Actinic keratosis: Secondary | ICD-10-CM | POA: Diagnosis not present

## 2020-11-08 DIAGNOSIS — Z85828 Personal history of other malignant neoplasm of skin: Secondary | ICD-10-CM | POA: Diagnosis not present

## 2020-11-08 DIAGNOSIS — C44222 Squamous cell carcinoma of skin of right ear and external auricular canal: Secondary | ICD-10-CM | POA: Diagnosis not present

## 2020-11-08 DIAGNOSIS — D485 Neoplasm of uncertain behavior of skin: Secondary | ICD-10-CM | POA: Diagnosis not present

## 2020-12-05 DIAGNOSIS — Z23 Encounter for immunization: Secondary | ICD-10-CM | POA: Diagnosis not present

## 2020-12-24 ENCOUNTER — Other Ambulatory Visit: Payer: Self-pay

## 2020-12-24 ENCOUNTER — Encounter: Payer: Self-pay | Admitting: Cardiology

## 2020-12-24 ENCOUNTER — Ambulatory Visit: Payer: MEDICARE | Admitting: Cardiology

## 2020-12-24 VITALS — BP 138/70 | HR 66 | Temp 97.8°F | Resp 17 | Ht 69.0 in | Wt 178.0 lb

## 2020-12-24 DIAGNOSIS — E782 Mixed hyperlipidemia: Secondary | ICD-10-CM

## 2020-12-24 DIAGNOSIS — I10 Essential (primary) hypertension: Secondary | ICD-10-CM

## 2020-12-24 DIAGNOSIS — I2510 Atherosclerotic heart disease of native coronary artery without angina pectoris: Secondary | ICD-10-CM

## 2020-12-24 NOTE — Progress Notes (Signed)
Follow up visit  Subjective:   Phillip Allen, male    DOB: 1940/06/24, 80 y.o.   MRN: [id number]   HPI  80 y.o. Caucasian male with hypertension, CAD (PCI in 2010), mild aortic ectasia   Patient underwent stress test and echocardiogram earlier in 2022, details below. He was prescribed metoprolol, but he has not been taking it.  Despite that, patient has not had any recurrent chest pain.  He is walking and playing pickle ball regularly without any difficulty.   Current Outpatient Medications on File Prior to Visit  Medication Sig Dispense Refill   amLODipine (NORVASC) 5 MG tablet Take 1 tablet (5 mg total) by mouth daily. 90 tablet 3   aspirin 81 MG EC tablet Take 81 mg by mouth daily.     Boswellia-Glucosamine-Vit D (OSTEO BI-FLEX ONE PER DAY PO) Take by mouth. 2 daily     buPROPion (WELLBUTRIN XL) 150 MG 24 hr tablet Take 150 mg by mouth every morning.     diclofenac Sodium (VOLTAREN) 1 % GEL Apply 1 application topically 4 (four) times daily.     Krill Oil 500 MG CAPS Take by mouth daily.     losartan (COZAAR) 50 MG tablet TAKE 1 TABLET (50 MG TOTAL) BY MOUTH DAILY. 90 tablet 1   metoprolol succinate (TOPROL-XL) 25 MG 24 hr tablet Take 1 tablet (25 mg total) by mouth daily. Take with or immediately following a meal. 30 tablet 3   Multiple Vitamins-Minerals (MULTIVITAMIN MEN 50+ PO) Take by mouth daily.     nitroGLYCERIN (NITROSTAT) 0.4 MG SL tablet Place 1 tablet (0.4 mg total) under the tongue every 5 (five) minutes as needed for chest pain. 30 tablet 3   rosuvastatin (CRESTOR) 40 MG tablet Take 40 mg by mouth daily.     sertraline (ZOLOFT) 100 MG tablet Take 100 mg by mouth daily.     No current facility-administered medications on file prior to visit.    Cardiovascular & other pertient studies:  EKG 12/24/2020: Sinus rhythm 64 bpm First degree A-V block  Left bundle branch block   Lexiscan Tetrofosmin stress test 08/06/2020: Lexiscan nuclear stress test performed using  1-day protocol. SPECT images show medium sized, mild intensity, reversible perfusion defect in mid to basal anteroseptal, inferoseptal myocardium. Stress LVEF 55%. Intermediate risk study.  Echocardiogram 08/01/2020:  Normal LV systolic function with visual EF 60-65%. Left ventricle cavity  is normal in size. Mild left ventricular hypertrophy. Normal global wall  motion. Normal diastolic filling pattern, normal LAP.  Aortic valve sclerosis without stenosis.  Mild (Grade I) aortic regurgitation.  Mild (Grade I) mitral regurgitation.  Mild tricuspid regurgitation. No evidence of pulmonary hypertension.  Compared to prior study dated 04/03/2017: Mild to moderate MR and TR are  now mild, otherwise no significant change.   Aorta duplex 03/10/2018: Maximum aorta diameter 2.65 cm.  Diffuse calcific plaque noted in proximal, mid, and distal aorta.  Mild abdominal aortic tic ectasia noted.  No significant change compared to previous study in 2018.  Follow-up when clinically indicated.  Coronary angiography 2010: LM: normal LAD: Ostial/prox 20% stenosis LCx: Normal RCA: Ostial PDA 60% stenosis          Occluded RPLB. Unsuccessful PCI attempt  Recent labs: 04/17/2020: Glucose 121, BUN/Cr 15/0.8. EGFR 86. HbA1C 6.3% Chol 133, TG 102, HDL 44, LDL 70 TSH 2.7 normal  07/11/2019: Glucose 158, BUN/Cr 17/0.8. EGFR 84 HbA1C 6.2%  01/25/2019: Glucose 122, BUN/Cr 10/0.7. EGFR 91. Na/K 139/5/0. Rest of the  CMP normal H/H 14/42. MCV 96. Platelets 296 HbA1C 5.9% Chol 162, TG 139, HDL 61, LDL 77 TSH 2.3 normal    Review of Systems  Cardiovascular:  Positive for chest pain (Angina equivalent, as per HPI). Negative for dyspnea on exertion, leg swelling, palpitations and syncope.        Vitals:   12/24/20 1439  BP: 138/70  Pulse: 66  Resp: 17  Temp: 97.8 F (36.6 C)  SpO2: 97%      Objective:   Physical Exam Vitals and nursing note reviewed.  Constitutional:      General: He is not  in acute distress. Neck:     Vascular: No JVD.  Cardiovascular:     Rate and Rhythm: Normal rate and regular rhythm.     Heart sounds: Normal heart sounds. No murmur heard. Pulmonary:     Effort: Pulmonary effort is normal.     Breath sounds: Normal breath sounds. No wheezing or rales.      Assessment & Recommendations:   80 y.o. Caucasian male with hypertension, CAD (PCI in 2010), mild aortic ectasia  CAD: Abnormal stress test in 08/2020, but no ongoing angina symptoms. Continue aspirin, statin, amlodipine.  He is currently not taking metoprolol, but has had no recurrent angina symptoms.  Hypertension: Well controlled  Mixed hyperlipidemia: Controlled  F/u after tests, cath  Phillip Mormon, MD [facility] Pager: [phone] Office: [phone]

## 2020-12-28 DIAGNOSIS — L570 Actinic keratosis: Secondary | ICD-10-CM | POA: Diagnosis not present

## 2021-01-08 DIAGNOSIS — E118 Type 2 diabetes mellitus with unspecified complications: Secondary | ICD-10-CM | POA: Diagnosis not present

## 2021-01-08 DIAGNOSIS — I10 Essential (primary) hypertension: Secondary | ICD-10-CM | POA: Diagnosis not present

## 2021-01-08 DIAGNOSIS — E782 Mixed hyperlipidemia: Secondary | ICD-10-CM | POA: Diagnosis not present

## 2021-01-10 DIAGNOSIS — E782 Mixed hyperlipidemia: Secondary | ICD-10-CM | POA: Diagnosis not present

## 2021-01-10 DIAGNOSIS — F411 Generalized anxiety disorder: Secondary | ICD-10-CM | POA: Diagnosis not present

## 2021-01-10 DIAGNOSIS — I10 Essential (primary) hypertension: Secondary | ICD-10-CM | POA: Diagnosis not present

## 2021-01-10 DIAGNOSIS — E1165 Type 2 diabetes mellitus with hyperglycemia: Secondary | ICD-10-CM | POA: Diagnosis not present

## 2021-01-24 DIAGNOSIS — Z23 Encounter for immunization: Secondary | ICD-10-CM | POA: Diagnosis not present

## 2021-02-26 DIAGNOSIS — L570 Actinic keratosis: Secondary | ICD-10-CM | POA: Diagnosis not present

## 2021-02-26 DIAGNOSIS — Z85828 Personal history of other malignant neoplasm of skin: Secondary | ICD-10-CM | POA: Diagnosis not present

## 2021-03-13 DIAGNOSIS — F4323 Adjustment disorder with mixed anxiety and depressed mood: Secondary | ICD-10-CM | POA: Diagnosis not present

## 2021-03-20 DIAGNOSIS — F4323 Adjustment disorder with mixed anxiety and depressed mood: Secondary | ICD-10-CM | POA: Diagnosis not present

## 2021-03-27 DIAGNOSIS — F4323 Adjustment disorder with mixed anxiety and depressed mood: Secondary | ICD-10-CM | POA: Diagnosis not present

## 2021-04-08 DIAGNOSIS — L309 Dermatitis, unspecified: Secondary | ICD-10-CM | POA: Diagnosis not present

## 2021-04-08 DIAGNOSIS — M170 Bilateral primary osteoarthritis of knee: Secondary | ICD-10-CM | POA: Diagnosis not present

## 2021-04-10 DIAGNOSIS — F4323 Adjustment disorder with mixed anxiety and depressed mood: Secondary | ICD-10-CM | POA: Diagnosis not present

## 2021-04-17 DIAGNOSIS — F4323 Adjustment disorder with mixed anxiety and depressed mood: Secondary | ICD-10-CM | POA: Diagnosis not present

## 2021-04-23 IMAGING — CR DG KNEE COMPLETE 4+V*L*
4 series · 4 of 4 positions shown · non-contrast
Comparison: 02/16/2017

CLINICAL DATA: Arthritis, BILATERAL medial knee pain

EXAM:
LEFT KNEE - COMPLETE 4+ VIEW

[w knee ap left]
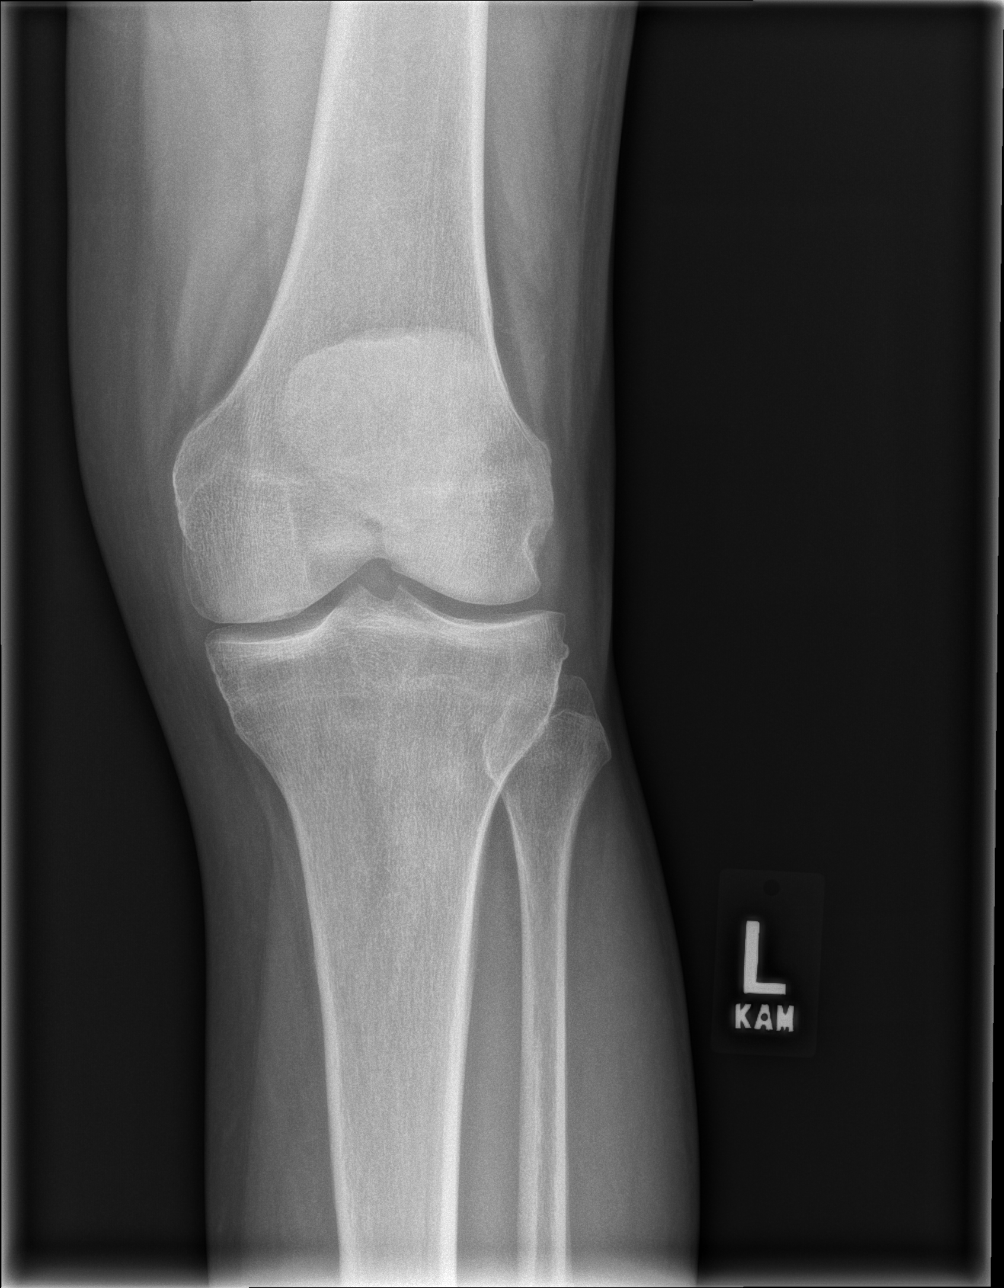

[w knee lat left]
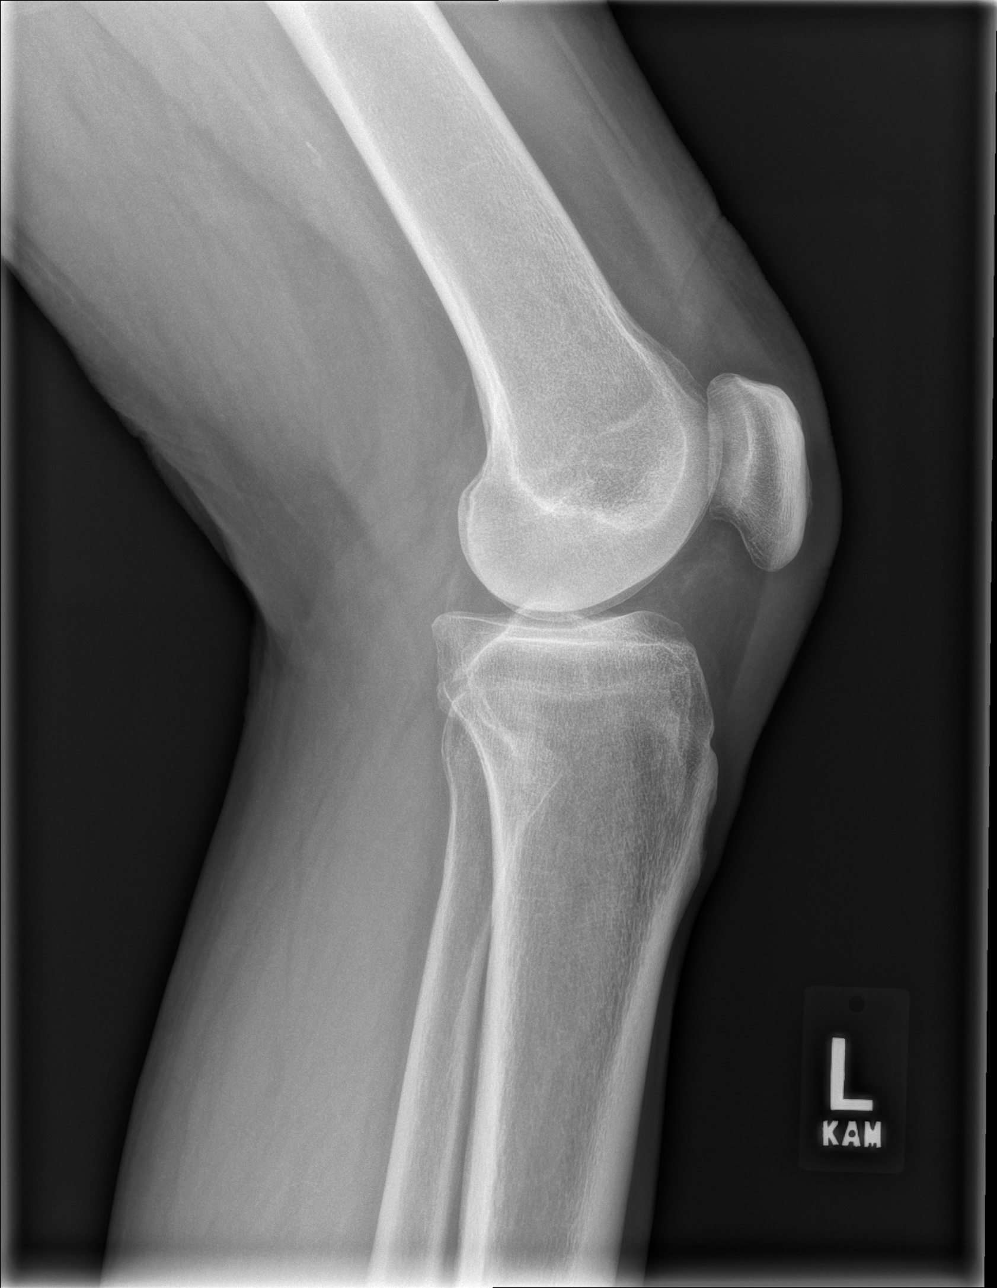

[w knee tunnel pa left]
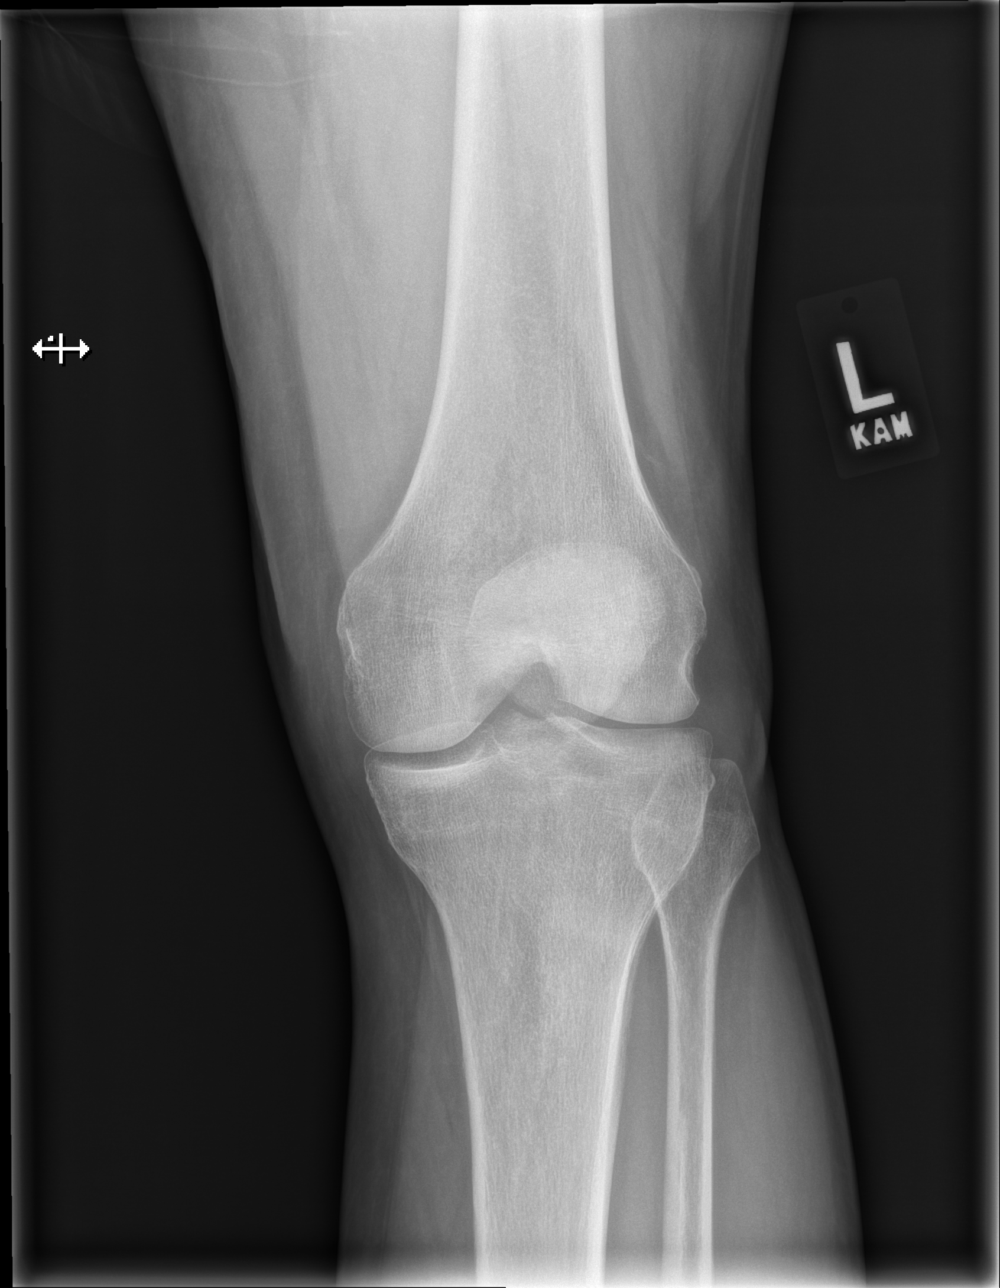

[x knee sunrise left]
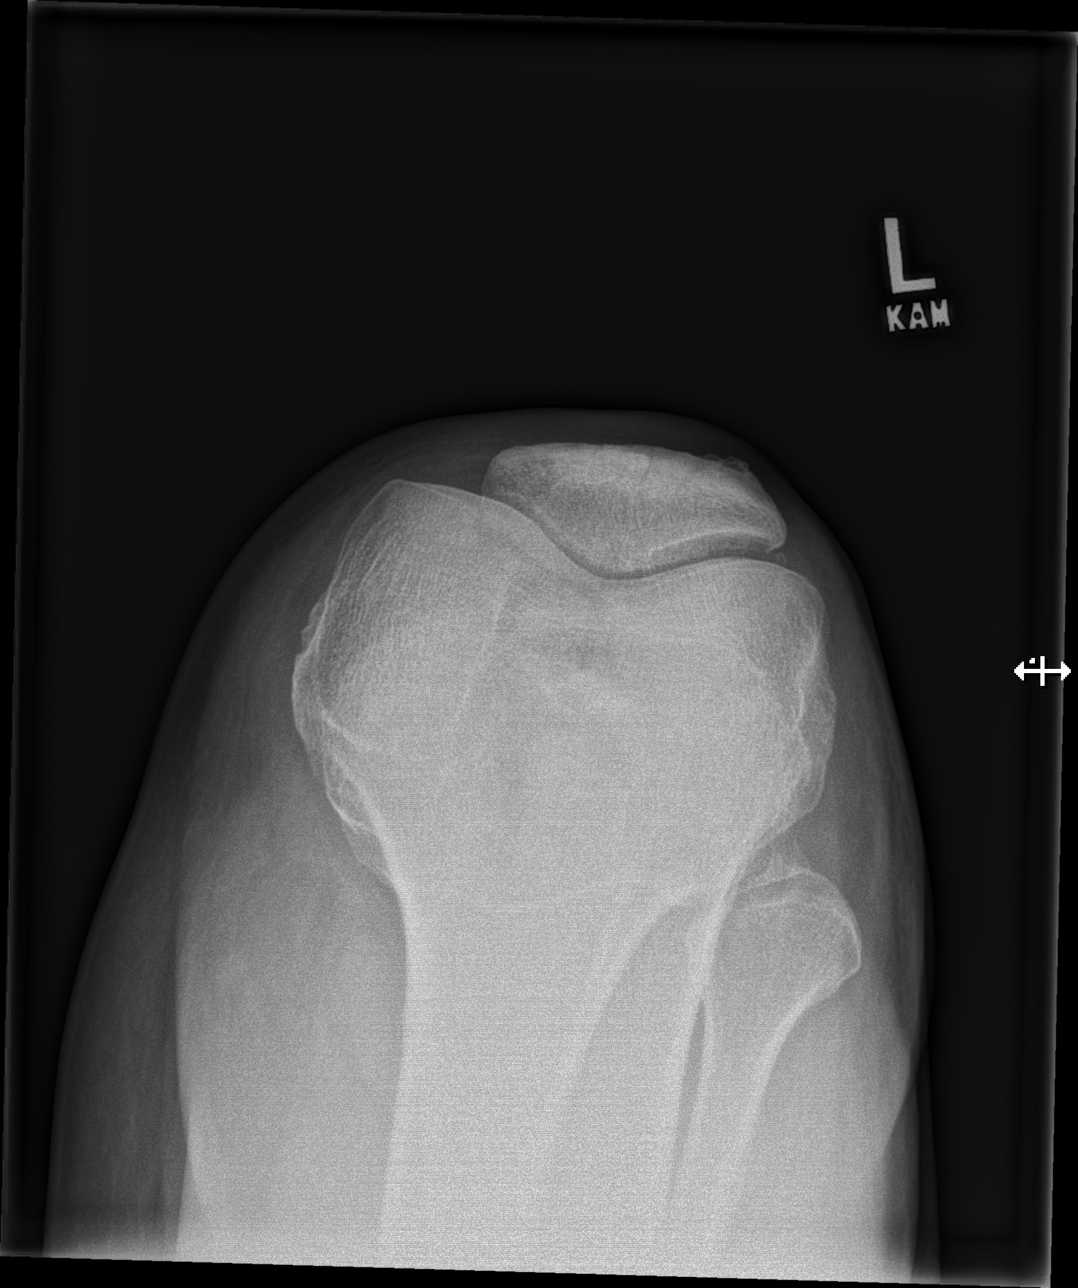

[4 of 4 positions shown; findings below may reference images not displayed]

FINDINGS: Exam made available for interpretation on 02/01/2020.

Osseous mineralization normal.

Mild diffuse joint space narrowing.

Tiny patellar spurs.

No fracture, dislocation or bone destruction.

No joint effusion.
IMPRESSION: Mild degenerative changes without acute abnormalities.

## 2021-04-23 IMAGING — CR DG KNEE COMPLETE 4+V*R*
4 series · 4 of 4 positions shown · non-contrast
Comparison: 02/16/2017

CLINICAL DATA: BILATERAL medial knee pain, primary osteoarthritis
of both knees

EXAM:
RIGHT KNEE - COMPLETE 4+ VIEW

[w knee ap right]
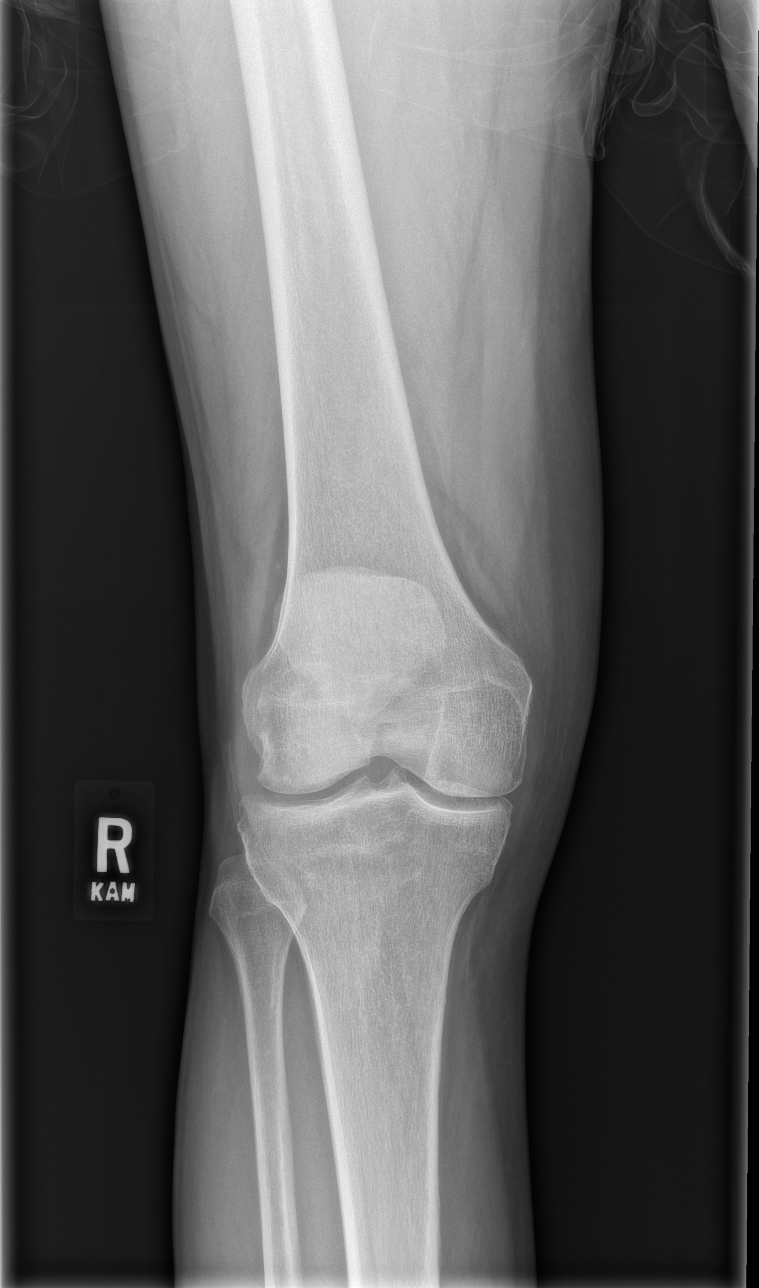

[w knee lat right]
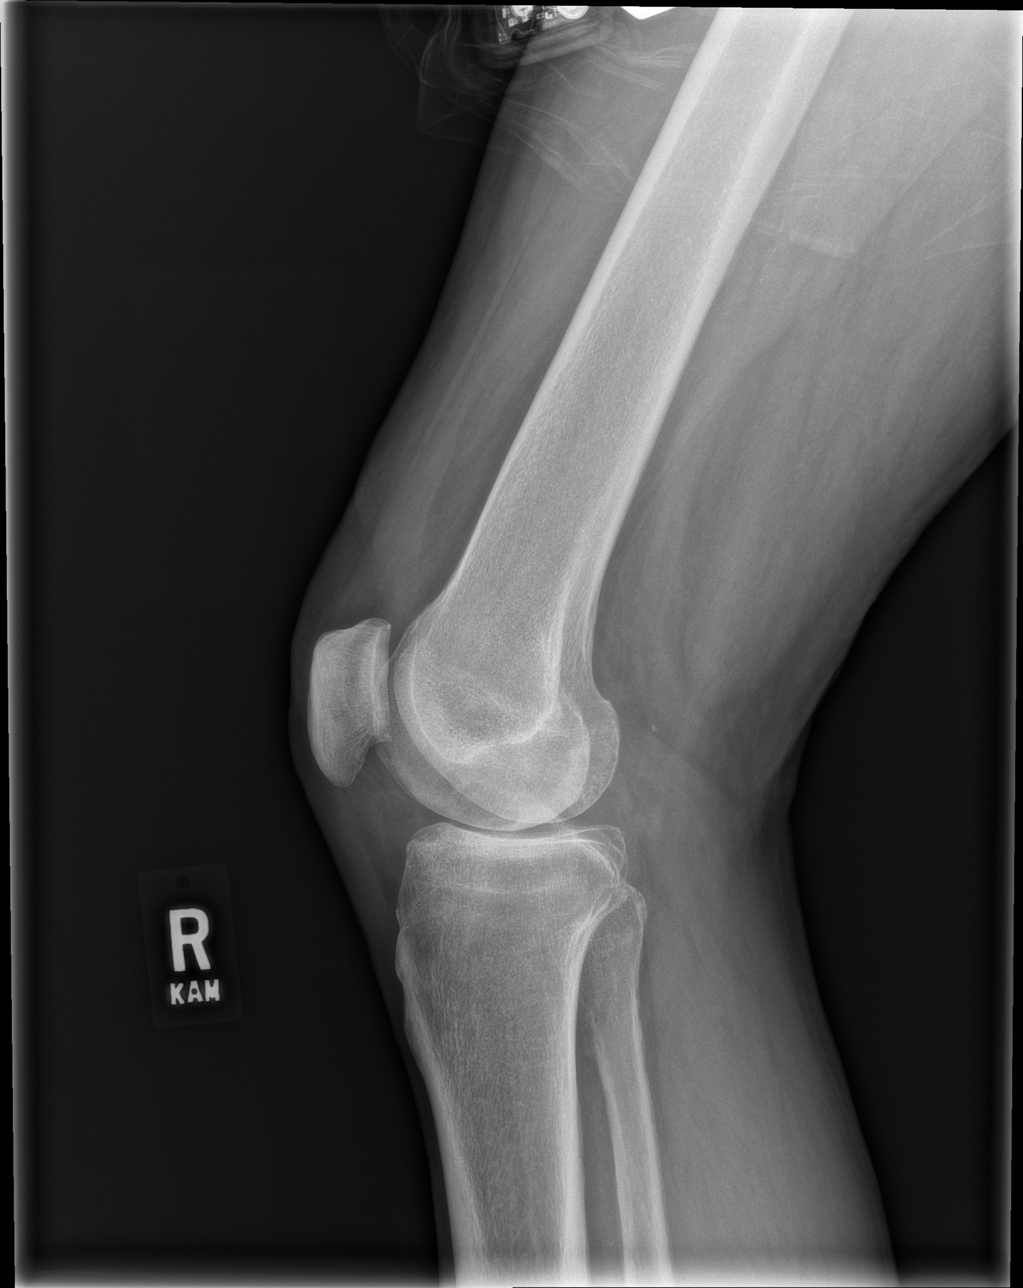

[w knee tunnel pa right]
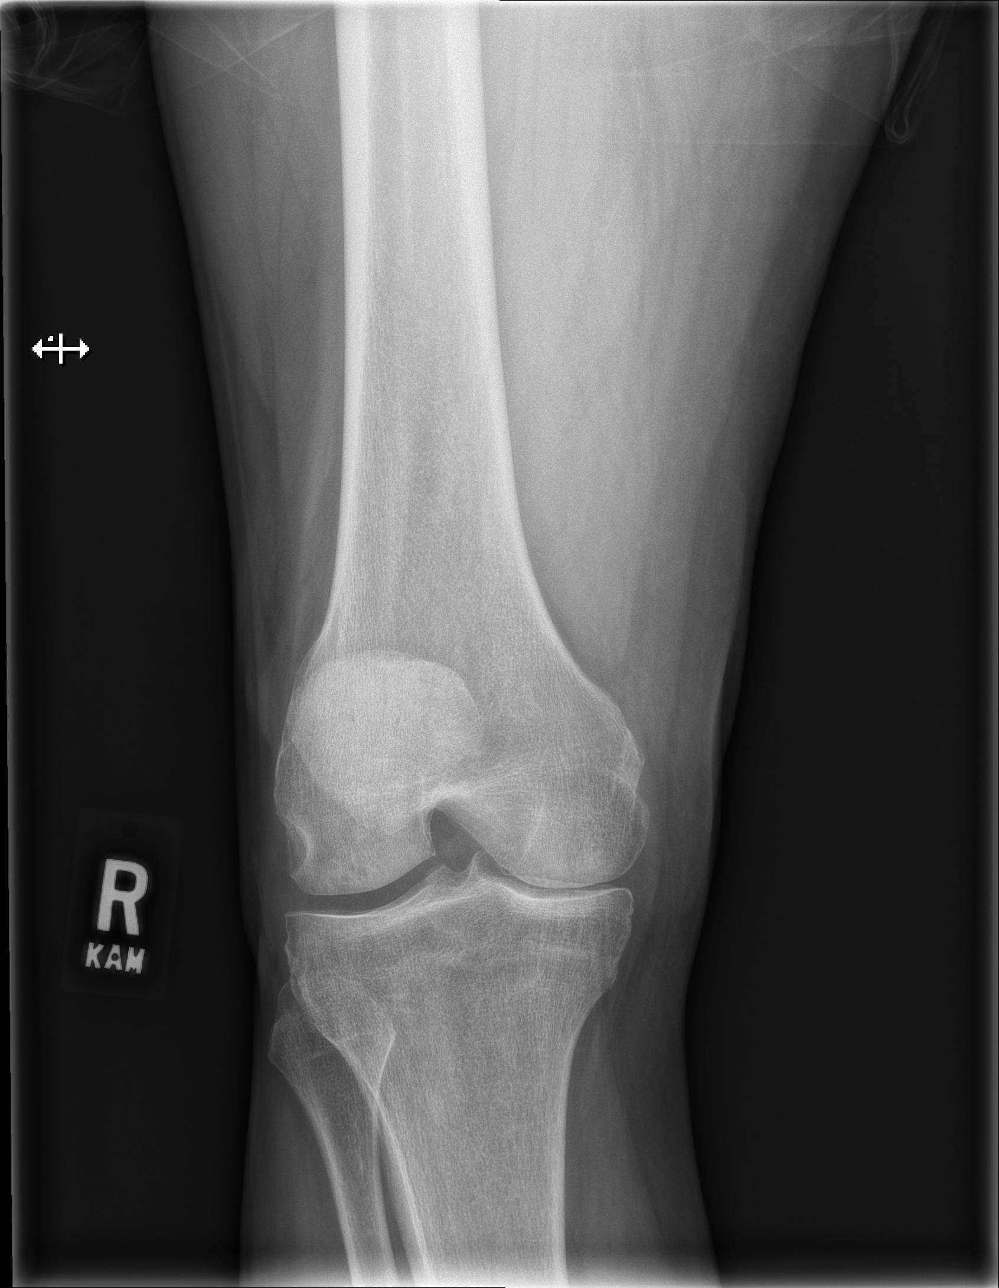

[x knee sunrise right]
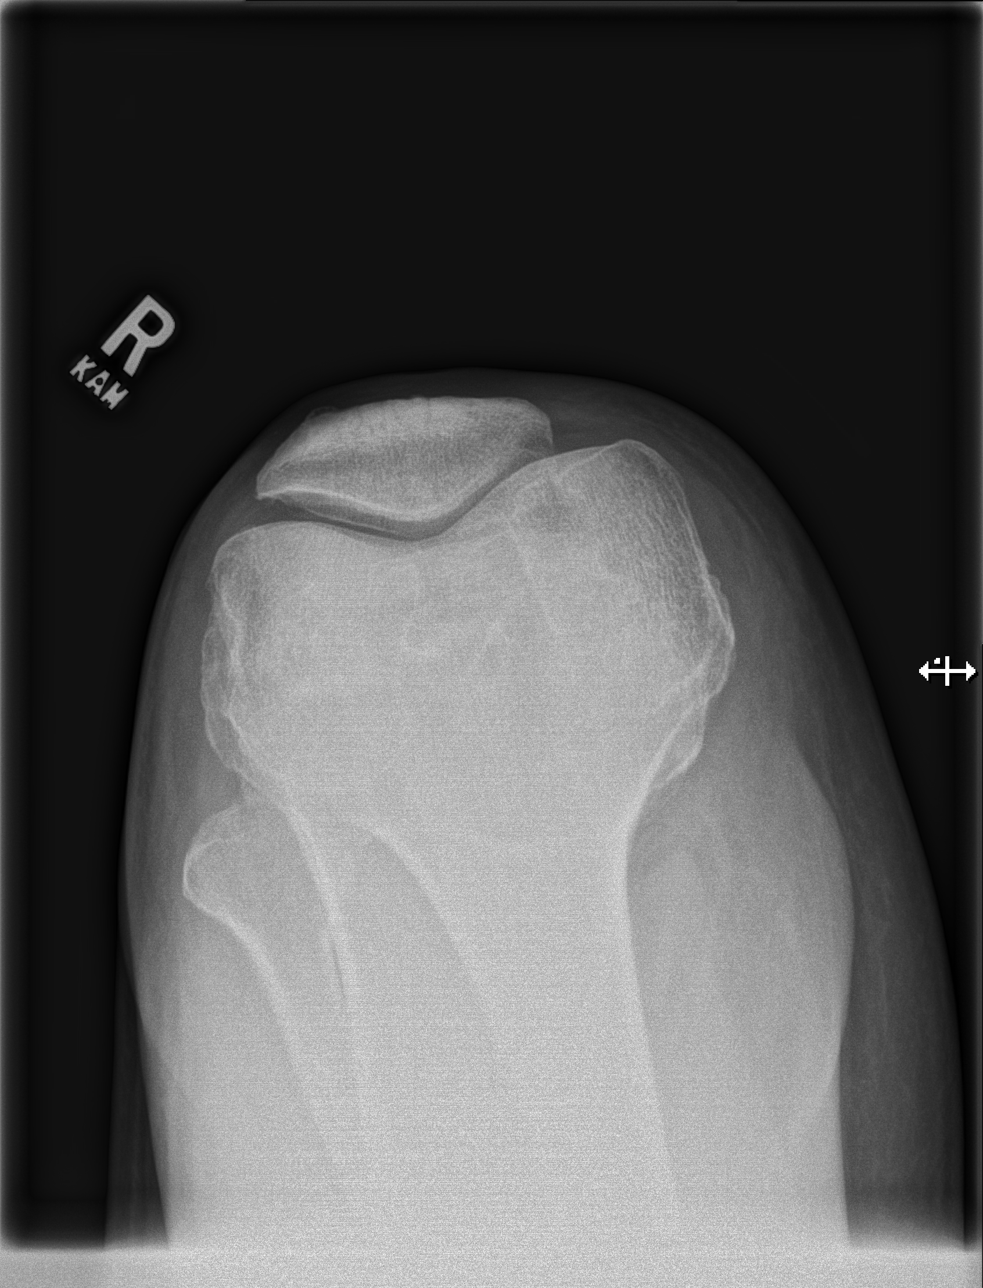

[4 of 4 positions shown; findings below may reference images not displayed]

FINDINGS: Exam made available for interpretation on 02/01/2020.

Osseous mineralization normal.

Mild joint space narrowing greatest at medial compartment.

No acute fracture, dislocation, or bone destruction.

No joint effusion.
IMPRESSION: Degenerative changes RIGHT knee.

## 2021-04-24 DIAGNOSIS — F4323 Adjustment disorder with mixed anxiety and depressed mood: Secondary | ICD-10-CM | POA: Diagnosis not present

## 2021-05-15 DIAGNOSIS — F4323 Adjustment disorder with mixed anxiety and depressed mood: Secondary | ICD-10-CM | POA: Diagnosis not present

## 2021-05-22 DIAGNOSIS — F4323 Adjustment disorder with mixed anxiety and depressed mood: Secondary | ICD-10-CM | POA: Diagnosis not present

## 2021-05-29 DIAGNOSIS — F4323 Adjustment disorder with mixed anxiety and depressed mood: Secondary | ICD-10-CM | POA: Diagnosis not present

## 2021-06-05 DIAGNOSIS — F4323 Adjustment disorder with mixed anxiety and depressed mood: Secondary | ICD-10-CM | POA: Diagnosis not present

## 2021-06-12 DIAGNOSIS — F4323 Adjustment disorder with mixed anxiety and depressed mood: Secondary | ICD-10-CM | POA: Diagnosis not present

## 2021-06-26 DIAGNOSIS — F4323 Adjustment disorder with mixed anxiety and depressed mood: Secondary | ICD-10-CM | POA: Diagnosis not present

## 2021-07-03 DIAGNOSIS — F4323 Adjustment disorder with mixed anxiety and depressed mood: Secondary | ICD-10-CM | POA: Diagnosis not present

## 2021-07-15 DIAGNOSIS — E1165 Type 2 diabetes mellitus with hyperglycemia: Secondary | ICD-10-CM | POA: Diagnosis not present

## 2021-07-17 DIAGNOSIS — F4323 Adjustment disorder with mixed anxiety and depressed mood: Secondary | ICD-10-CM | POA: Diagnosis not present

## 2021-07-23 DIAGNOSIS — F4323 Adjustment disorder with mixed anxiety and depressed mood: Secondary | ICD-10-CM | POA: Diagnosis not present

## 2021-07-29 DIAGNOSIS — M170 Bilateral primary osteoarthritis of knee: Secondary | ICD-10-CM | POA: Diagnosis not present

## 2021-07-29 DIAGNOSIS — M545 Low back pain, unspecified: Secondary | ICD-10-CM | POA: Diagnosis not present

## 2021-07-31 DIAGNOSIS — F4323 Adjustment disorder with mixed anxiety and depressed mood: Secondary | ICD-10-CM | POA: Diagnosis not present

## 2021-08-05 DIAGNOSIS — L570 Actinic keratosis: Secondary | ICD-10-CM | POA: Diagnosis not present

## 2021-08-05 DIAGNOSIS — Z85828 Personal history of other malignant neoplasm of skin: Secondary | ICD-10-CM | POA: Diagnosis not present

## 2021-08-05 DIAGNOSIS — D485 Neoplasm of uncertain behavior of skin: Secondary | ICD-10-CM | POA: Diagnosis not present

## 2021-08-05 DIAGNOSIS — M170 Bilateral primary osteoarthritis of knee: Secondary | ICD-10-CM | POA: Diagnosis not present

## 2021-08-05 DIAGNOSIS — D0462 Carcinoma in situ of skin of left upper limb, including shoulder: Secondary | ICD-10-CM | POA: Diagnosis not present

## 2021-08-07 DIAGNOSIS — F4323 Adjustment disorder with mixed anxiety and depressed mood: Secondary | ICD-10-CM | POA: Diagnosis not present

## 2021-08-14 DIAGNOSIS — F4323 Adjustment disorder with mixed anxiety and depressed mood: Secondary | ICD-10-CM | POA: Diagnosis not present

## 2021-08-19 DIAGNOSIS — F411 Generalized anxiety disorder: Secondary | ICD-10-CM | POA: Diagnosis not present

## 2021-08-19 DIAGNOSIS — M170 Bilateral primary osteoarthritis of knee: Secondary | ICD-10-CM | POA: Diagnosis not present

## 2021-08-19 DIAGNOSIS — G4700 Insomnia, unspecified: Secondary | ICD-10-CM | POA: Diagnosis not present

## 2021-08-19 DIAGNOSIS — F331 Major depressive disorder, recurrent, moderate: Secondary | ICD-10-CM | POA: Diagnosis not present

## 2021-08-21 DIAGNOSIS — F4323 Adjustment disorder with mixed anxiety and depressed mood: Secondary | ICD-10-CM | POA: Diagnosis not present

## 2021-08-29 DIAGNOSIS — Z23 Encounter for immunization: Secondary | ICD-10-CM | POA: Diagnosis not present

## 2021-09-04 DIAGNOSIS — F4323 Adjustment disorder with mixed anxiety and depressed mood: Secondary | ICD-10-CM | POA: Diagnosis not present

## 2021-09-09 DIAGNOSIS — M2550 Pain in unspecified joint: Secondary | ICD-10-CM | POA: Diagnosis not present

## 2021-09-09 DIAGNOSIS — I10 Essential (primary) hypertension: Secondary | ICD-10-CM | POA: Diagnosis not present

## 2021-09-13 DIAGNOSIS — M2550 Pain in unspecified joint: Secondary | ICD-10-CM | POA: Diagnosis not present

## 2021-09-18 DIAGNOSIS — G4700 Insomnia, unspecified: Secondary | ICD-10-CM | POA: Diagnosis not present

## 2021-09-18 DIAGNOSIS — F331 Major depressive disorder, recurrent, moderate: Secondary | ICD-10-CM | POA: Diagnosis not present

## 2021-09-18 DIAGNOSIS — M2550 Pain in unspecified joint: Secondary | ICD-10-CM | POA: Diagnosis not present

## 2021-09-18 DIAGNOSIS — I10 Essential (primary) hypertension: Secondary | ICD-10-CM | POA: Diagnosis not present

## 2021-09-18 DIAGNOSIS — F411 Generalized anxiety disorder: Secondary | ICD-10-CM | POA: Diagnosis not present

## 2021-09-18 DIAGNOSIS — F4323 Adjustment disorder with mixed anxiety and depressed mood: Secondary | ICD-10-CM | POA: Diagnosis not present

## 2021-10-02 DIAGNOSIS — F4323 Adjustment disorder with mixed anxiety and depressed mood: Secondary | ICD-10-CM | POA: Diagnosis not present

## 2021-10-16 ENCOUNTER — Inpatient Hospital Stay (HOSPITAL_COMMUNITY)
Admission: EM | Disposition: A | Discharge status: Home / Self Care | Payer: Self-pay | Source: Home / Self Care | Attending: Cardiology

## 2021-10-16 ENCOUNTER — Inpatient Hospital Stay (HOSPITAL_COMMUNITY): Payer: MEDICARE

## 2021-10-16 ENCOUNTER — Inpatient Hospital Stay (HOSPITAL_BASED_OUTPATIENT_CLINIC_OR_DEPARTMENT_OTHER)
Admission: EM | Admit: 2021-10-16 | Discharge: 2021-10-18 | DRG: 244 | Disposition: A | Discharge status: Home / Self Care | Payer: MEDICARE | Attending: Cardiology | Admitting: Cardiology

## 2021-10-16 ENCOUNTER — Encounter (HOSPITAL_BASED_OUTPATIENT_CLINIC_OR_DEPARTMENT_OTHER): Payer: Self-pay | Admitting: Emergency Medicine

## 2021-10-16 ENCOUNTER — Other Ambulatory Visit: Payer: Self-pay

## 2021-10-16 ENCOUNTER — Emergency Department (HOSPITAL_COMMUNITY): Payer: MEDICARE

## 2021-10-16 ENCOUNTER — Emergency Department (HOSPITAL_BASED_OUTPATIENT_CLINIC_OR_DEPARTMENT_OTHER): Payer: MEDICARE | Admitting: Radiology

## 2021-10-16 DIAGNOSIS — I10 Essential (primary) hypertension: Secondary | ICD-10-CM | POA: Diagnosis present

## 2021-10-16 DIAGNOSIS — E782 Mixed hyperlipidemia: Secondary | ICD-10-CM | POA: Diagnosis present

## 2021-10-16 DIAGNOSIS — I441 Atrioventricular block, second degree: Secondary | ICD-10-CM | POA: Diagnosis present

## 2021-10-16 DIAGNOSIS — I252 Old myocardial infarction: Secondary | ICD-10-CM

## 2021-10-16 DIAGNOSIS — Z79899 Other long term (current) drug therapy: Secondary | ICD-10-CM | POA: Diagnosis not present

## 2021-10-16 DIAGNOSIS — I442 Atrioventricular block, complete: Secondary | ICD-10-CM | POA: Diagnosis not present

## 2021-10-16 DIAGNOSIS — M2550 Pain in unspecified joint: Secondary | ICD-10-CM | POA: Diagnosis not present

## 2021-10-16 DIAGNOSIS — Z791 Long term (current) use of non-steroidal anti-inflammatories (NSAID): Secondary | ICD-10-CM | POA: Diagnosis not present

## 2021-10-16 DIAGNOSIS — E785 Hyperlipidemia, unspecified: Secondary | ICD-10-CM | POA: Diagnosis present

## 2021-10-16 DIAGNOSIS — R001 Bradycardia, unspecified: Secondary | ICD-10-CM | POA: Diagnosis not present

## 2021-10-16 DIAGNOSIS — J9811 Atelectasis: Secondary | ICD-10-CM | POA: Diagnosis not present

## 2021-10-16 DIAGNOSIS — Z880 Allergy status to penicillin: Secondary | ICD-10-CM

## 2021-10-16 DIAGNOSIS — E1165 Type 2 diabetes mellitus with hyperglycemia: Secondary | ICD-10-CM | POA: Diagnosis not present

## 2021-10-16 DIAGNOSIS — F32A Depression, unspecified: Secondary | ICD-10-CM | POA: Diagnosis present

## 2021-10-16 DIAGNOSIS — Z8249 Family history of ischemic heart disease and other diseases of the circulatory system: Secondary | ICD-10-CM | POA: Diagnosis not present

## 2021-10-16 DIAGNOSIS — I214 Non-ST elevation (NSTEMI) myocardial infarction: Secondary | ICD-10-CM

## 2021-10-16 DIAGNOSIS — R072 Precordial pain: Secondary | ICD-10-CM | POA: Diagnosis not present

## 2021-10-16 DIAGNOSIS — I2510 Atherosclerotic heart disease of native coronary artery without angina pectoris: Secondary | ICD-10-CM | POA: Diagnosis present

## 2021-10-16 DIAGNOSIS — I083 Combined rheumatic disorders of mitral, aortic and tricuspid valves: Secondary | ICD-10-CM | POA: Diagnosis not present

## 2021-10-16 DIAGNOSIS — I447 Left bundle-branch block, unspecified: Secondary | ICD-10-CM | POA: Diagnosis present

## 2021-10-16 DIAGNOSIS — Z87891 Personal history of nicotine dependence: Secondary | ICD-10-CM

## 2021-10-16 DIAGNOSIS — R079 Chest pain, unspecified: Secondary | ICD-10-CM | POA: Diagnosis not present

## 2021-10-16 DIAGNOSIS — I499 Cardiac arrhythmia, unspecified: Secondary | ICD-10-CM | POA: Diagnosis not present

## 2021-10-16 HISTORY — PX: TEMPORARY PACEMAKER: CATH118268

## 2021-10-16 HISTORY — PX: CORONARY STENT INTERVENTION: CATH118234

## 2021-10-16 HISTORY — PX: LEFT HEART CATH AND CORONARY ANGIOGRAPHY: CATH118249

## 2021-10-16 LAB — POCT ACTIVATED CLOTTING TIME: Activated Clotting Time: 269 seconds

## 2021-10-16 LAB — CBC
HCT: 43.2 % (ref 39.0–52.0)
Hemoglobin: 13.9 g/dL (ref 13.0–17.0)
MCH: 30.8 pg (ref 26.0–34.0)
MCHC: 32.2 g/dL (ref 30.0–36.0)
MCV: 95.6 fL (ref 80.0–100.0)
Platelets: 259 10*3/uL (ref 150–400)
RBC: 4.52 MIL/uL (ref 4.22–5.81)
RDW: 12.5 % (ref 11.5–15.5)
WBC: 8.5 10*3/uL (ref 4.0–10.5)
nRBC: 0 % (ref 0.0–0.2)

## 2021-10-16 LAB — BASIC METABOLIC PANEL
Anion gap: 8 (ref 5–15)
BUN: 19 mg/dL (ref 8–23)
CO2: 27 mmol/L (ref 22–32)
Calcium: 9.8 mg/dL (ref 8.9–10.3)
Chloride: 106 mmol/L (ref 98–111)
Creatinine, Ser: 0.87 mg/dL (ref 0.61–1.24)
GFR, Estimated: >60 mL/min (ref >60)
Glucose, Bld: 115 mg/dL — ABNORMAL HIGH (ref 70–99)
Potassium: 4.6 mmol/L (ref 3.5–5.1)
Sodium: 141 mmol/L (ref 135–145)

## 2021-10-16 LAB — BRAIN NATRIURETIC PEPTIDE: B Natriuretic Peptide: 249.5 pg/mL — ABNORMAL HIGH (ref 0.0–100.0)

## 2021-10-16 LAB — MRSA NEXT GEN BY PCR, NASAL: MRSA by PCR Next Gen: NOT DETECTED

## 2021-10-16 LAB — TSH: TSH: 3.2 u[IU]/mL (ref 0.350–4.500)

## 2021-10-16 LAB — TROPONIN I (HIGH SENSITIVITY)
Troponin I (High Sensitivity): 11 ng/L (ref <18)
Troponin I (High Sensitivity): 12 ng/L (ref <18)

## 2021-10-16 SURGERY — LEFT HEART CATH AND CORONARY ANGIOGRAPHY
Anesthesia: LOCAL

## 2021-10-16 MED ORDER — ROSUVASTATIN CALCIUM 20 MG PO TABS
40.0000 mg | ORAL_TABLET | Freq: Every day | ORAL | Status: DC
  Administered 2021-10-16 – 2021-10-18 (×3): 40 mg via ORAL
  Filled 2021-10-16 (×3): qty 2

## 2021-10-16 MED ORDER — SODIUM CHLORIDE 0.9 % IV SOLN: INTRAVENOUS | Status: AC

## 2021-10-16 MED ORDER — FENTANYL CITRATE (PF) 100 MCG/2ML IJ SOLN
INTRAMUSCULAR | Status: DC | PRN
  Administered 2021-10-16: 12.5 ug via INTRAVENOUS

## 2021-10-16 MED ORDER — SODIUM CHLORIDE 0.9 % IV SOLN
INTRAVENOUS | Status: AC | PRN
  Administered 2021-10-16: 50 mL/h via INTRAVENOUS
  Administered 2021-10-16: 10 mL/h via INTRAVENOUS

## 2021-10-16 MED ORDER — ONDANSETRON HCL 4 MG/2ML IJ SOLN: 4.0000 mg | Freq: Four times a day (QID) | INTRAMUSCULAR | Status: DC | PRN

## 2021-10-16 MED ORDER — HEPARIN SODIUM (PORCINE) 1000 UNIT/ML IJ SOLN
INTRAMUSCULAR | Status: AC
Start: 2021-10-16 — End: ?
  Filled 2021-10-16: qty 10

## 2021-10-16 MED ORDER — VERAPAMIL HCL 2.5 MG/ML IV SOLN
INTRAVENOUS | Status: AC
  Filled 2021-10-16: qty 2

## 2021-10-16 MED ORDER — CHLORHEXIDINE GLUCONATE CLOTH 2 % EX PADS
6.0000 | MEDICATED_PAD | Freq: Every day | CUTANEOUS | Status: DC
Start: 2021-10-17 — End: 2021-10-18
  Administered 2021-10-16 – 2021-10-17 (×2): 6 via TOPICAL

## 2021-10-16 MED ORDER — HEPARIN (PORCINE) IN NACL 1000-0.9 UT/500ML-% IV SOLN
INTRAVENOUS | Status: AC
  Filled 2021-10-16: qty 500

## 2021-10-16 MED ORDER — LABETALOL HCL 5 MG/ML IV SOLN
10.0000 mg | INTRAVENOUS | Status: AC | PRN
Start: 2021-10-16 — End: 2021-10-17

## 2021-10-16 MED ORDER — IOHEXOL 350 MG/ML SOLN
INTRAVENOUS | Status: DC | PRN
  Administered 2021-10-16: 190 mL

## 2021-10-16 MED ORDER — FENTANYL CITRATE (PF) 100 MCG/2ML IJ SOLN
INTRAMUSCULAR | Status: AC
  Filled 2021-10-16: qty 2

## 2021-10-16 MED ORDER — SODIUM CHLORIDE 0.9% FLUSH
3.0000 mL | Freq: Two times a day (BID) | INTRAVENOUS | Status: DC
  Administered 2021-10-16 – 2021-10-17 (×2): 3 mL via INTRAVENOUS

## 2021-10-16 MED ORDER — DOPAMINE-DEXTROSE 3.2-5 MG/ML-% IV SOLN
0.0000 ug/kg/min | INTRAVENOUS | Status: DC
  Filled 2021-10-16: qty 250

## 2021-10-16 MED ORDER — VORTIOXETINE HBR 20 MG PO TABS
20.0000 mg | ORAL_TABLET | Freq: Every day | ORAL | Status: DC
  Administered 2021-10-16 – 2021-10-18 (×3): 20 mg via ORAL
  Filled 2021-10-16 (×3): qty 1

## 2021-10-16 MED ORDER — MIDAZOLAM HCL 2 MG/2ML IJ SOLN
INTRAMUSCULAR | Status: DC | PRN
  Administered 2021-10-16: .5 mg via INTRAVENOUS

## 2021-10-16 MED ORDER — HYDRALAZINE HCL 20 MG/ML IJ SOLN: 10.0000 mg | INTRAMUSCULAR | Status: AC | PRN

## 2021-10-16 MED ORDER — SODIUM CHLORIDE 0.9 % IV SOLN: 250.0000 mL | INTRAVENOUS | Status: DC | PRN

## 2021-10-16 MED ORDER — MELOXICAM 7.5 MG PO TABS
7.5000 mg | ORAL_TABLET | Freq: Two times a day (BID) | ORAL | Status: DC
  Administered 2021-10-16 – 2021-10-18 (×4): 7.5 mg via ORAL
  Filled 2021-10-16 (×4): qty 1

## 2021-10-16 MED ORDER — SODIUM CHLORIDE 0.9% FLUSH
3.0000 mL | INTRAVENOUS | Status: DC | PRN
  Administered 2021-10-17: 3 mL via INTRAVENOUS

## 2021-10-16 MED ORDER — DOCUSATE SODIUM 100 MG PO CAPS
100.0000 mg | ORAL_CAPSULE | Freq: Two times a day (BID) | ORAL | Status: DC
  Administered 2021-10-16 – 2021-10-18 (×4): 100 mg via ORAL
  Filled 2021-10-16 (×4): qty 1

## 2021-10-16 MED ORDER — MIDAZOLAM HCL 2 MG/2ML IJ SOLN
INTRAMUSCULAR | Status: AC
  Filled 2021-10-16: qty 2

## 2021-10-16 MED ORDER — HEPARIN SODIUM (PORCINE) 1000 UNIT/ML IJ SOLN
INTRAMUSCULAR | Status: DC | PRN
  Administered 2021-10-16: 3000 [IU] via INTRAVENOUS
  Administered 2021-10-16: 4000 [IU] via INTRAVENOUS

## 2021-10-16 MED ORDER — VERAPAMIL HCL 2.5 MG/ML IV SOLN
INTRAVENOUS | Status: DC | PRN
  Administered 2021-10-16: 10 mL via INTRA_ARTERIAL

## 2021-10-16 MED ORDER — HEPARIN (PORCINE) IN NACL 1000-0.9 UT/500ML-% IV SOLN
INTRAVENOUS | Status: DC | PRN
  Administered 2021-10-16 (×2): 500 mL

## 2021-10-16 MED ORDER — LIDOCAINE HCL (PF) 1 % IJ SOLN
INTRAMUSCULAR | Status: DC | PRN
  Administered 2021-10-16: 2 mL
  Administered 2021-10-16: 5 mL

## 2021-10-16 MED ORDER — ACETAMINOPHEN 325 MG PO TABS: 650.0000 mg | ORAL_TABLET | Freq: Three times a day (TID) | ORAL | Status: DC | PRN

## 2021-10-16 MED ORDER — POLYETHYLENE GLYCOL 3350 17 G PO PACK: 17.0000 g | PACK | Freq: Every day | ORAL | Status: DC | PRN

## 2021-10-16 MED ORDER — DOCUSATE SODIUM 100 MG PO CAPS: 100.0000 mg | ORAL_CAPSULE | Freq: Two times a day (BID) | ORAL | Status: DC | PRN

## 2021-10-16 MED ORDER — ACETAMINOPHEN 325 MG PO TABS
650.0000 mg | ORAL_TABLET | ORAL | Status: DC | PRN
  Administered 2021-10-16: 650 mg via ORAL

## 2021-10-16 MED ORDER — SODIUM CHLORIDE 0.9 % IV SOLN
INTRAVENOUS | Status: AC | PRN
  Administered 2021-10-16: 10 mL/h via INTRAVENOUS

## 2021-10-16 MED ORDER — ASPIRIN 81 MG PO CHEW
CHEWABLE_TABLET | ORAL | Status: AC
  Filled 2021-10-16: qty 1

## 2021-10-16 MED ORDER — ACETAMINOPHEN 325 MG PO TABS
650.0000 mg | ORAL_TABLET | Freq: Two times a day (BID) | ORAL | Status: DC
  Administered 2021-10-17 – 2021-10-18 (×3): 650 mg via ORAL
  Filled 2021-10-16 (×5): qty 2

## 2021-10-16 SURGICAL SUPPLY — 28 items
BAND ZEPHYR COMPRESS 30 LONG (HEMOSTASIS) ×1 IMPLANT
CABLE ADAPT PACING TEMP 12FT (ADAPTER) ×1 IMPLANT
CATH INFINITI 5 FR AR1 MOD (CATHETERS) ×1 IMPLANT
CATH INFINITI 5 FR JL3.5 (CATHETERS) ×1 IMPLANT
CATH INFINITI JR4 5F (CATHETERS) ×1 IMPLANT
CATH LAUNCHER 5F EBU3.0 (CATHETERS) IMPLANT
CATH LAUNCHER 6FR EBU 3 (CATHETERS) ×1 IMPLANT
CATH OPTITORQUE TIG 4.0 5F (CATHETERS) ×1 IMPLANT
CATHETER LAUNCHER 5F EBU3.0 (CATHETERS) ×2
ELECT DEFIB PAD ADLT CADENCE (PAD) ×1 IMPLANT
GLIDESHEATH SLEND A-KIT 6F 22G (SHEATH) ×1 IMPLANT
GUIDEWIRE INQWIRE 1.5J.035X260 (WIRE) IMPLANT
INQWIRE 1.5J .035X260CM (WIRE) ×2
KIT ENCORE 26 ADVANTAGE (KITS) ×1 IMPLANT
KIT HEART LEFT (KITS) ×2 IMPLANT
KIT HEMO VALVE WATCHDOG (MISCELLANEOUS) ×1 IMPLANT
KIT MICROPUNCTURE NIT STIFF (SHEATH) ×1 IMPLANT
PACK CARDIAC CATHETERIZATION (CUSTOM PROCEDURE TRAY) ×2 IMPLANT
SHEATH PINNACLE 5F 10CM (SHEATH) IMPLANT
SHEATH PINNACLE 6F 10CM (SHEATH) ×1 IMPLANT
SHEATH PROBE COVER 6X72 (BAG) ×1 IMPLANT
SYR MEDRAD MARK 7 150ML (SYRINGE) ×2 IMPLANT
TRANSDUCER W/STOPCOCK (MISCELLANEOUS) ×2 IMPLANT
TUBING CIL FLEX 10 FLL-RA (TUBING) ×2 IMPLANT
WIRE ASAHI PROWATER 180CM (WIRE) ×1 IMPLANT
WIRE COUGAR XT STRL 190CM (WIRE) ×1 IMPLANT
WIRE HI TORQ BMW 190CM (WIRE) ×1 IMPLANT
WIRE PACING TEMP ST TIP 5 (CATHETERS) ×1 IMPLANT

## 2021-10-16 NOTE — CV Procedure (Signed)
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 Full report to follow.   [name], MD Pager: [phone] Office: [phone]

## 2021-10-16 NOTE — ED Provider Notes (Signed)
[facility] EMERGENCY DEPT Provider Note   CSN: [id number] Arrival date & time: 10/16/21  1155     History  Chief Complaint  Patient presents with   Chest Pain    Phillip Allen is a 81 y.o. male.  Patient sent into the emergency department by primary care Dr. Rachell Cipro, for slow heart rate.  EKG consistent with second-degree heart block none winky block.  So therefore type II Mobitz.  Patient awoke up between 1 and 2 in the morning with severe chest pain anteriorly that went to both shoulders.  Did not go to the back.  Got diaphoretic.  That was the reason why he went to his primary care doctor.  No chest pain now.  Had nausea with the event as well.  Patient does not normally have any chest pain.  This was way out of the ordinary.  Past medical history otherwise significant for hyperlipidemia coronary artery disease.  Patient's had a PCI in the past.  And right shoulder surgery in 2019.  Patient former smoker quit 1996.       Home Medications Prior to Admission medications   Medication Sig Start Date End Date Taking? Authorizing Provider  acetaminophen (TYLENOL) 650 MG CR tablet Take 650 mg by mouth every 12 (twelve) hours. & up to 3 times daily as needed   Yes [provider]  amLODipine (NORVASC) 5 MG tablet Take 1 tablet (5 mg total) by mouth daily. 09/08/19  Yes Patwardhan, Manish J, MD  diclofenac Sodium (VOLTAREN) 1 % GEL Apply 1 application topically 4 (four) times daily. 12/20/19  Yes [provider]  docusate sodium (COLACE) 100 MG capsule Take 100 mg by mouth 2 (two) times daily.   Yes [provider]  Glucosamine-Chondroitin 250-200 MG TABS Take 1 tablet by mouth 2 (two) times daily.   Yes [provider]  Astrid Drafts 500 MG CAPS Take by mouth daily.   Yes [provider]  losartan (COZAAR) 50 MG tablet TAKE 1 TABLET (50 MG TOTAL) BY MOUTH DAILY. 09/16/19 10/16/21 Yes Patwardhan, Manish J, MD  meloxicam (MOBIC)  7.5 MG tablet Take 7.5 mg by mouth 2 (two) times daily.   Yes [provider]  Multiple Vitamins-Minerals (MULTIVITAMIN MEN 50+ PO) Take by mouth daily.   Yes [provider]  rosuvastatin (CRESTOR) 40 MG tablet Take 40 mg by mouth daily.   Yes [provider]  vortioxetine HBr (TRINTELLIX) 20 MG TABS tablet Take 20 mg by mouth daily.   Yes [provider]  nitroGLYCERIN (NITROSTAT) 0.4 MG SL tablet Place 1 tablet (0.4 mg total) under the tongue every 5 (five) minutes as needed for chest pain. 07/23/20 12/24/20  [name], MD      Allergies    Penicillins    Review of Systems   Review of Systems  Constitutional:  Positive for diaphoresis. Negative for chills and fever.  HENT:  Negative for ear pain and sore throat.   Eyes:  Negative for pain and visual disturbance.  Respiratory:  Negative for cough and shortness of breath.   Cardiovascular:  Positive for chest pain. Negative for palpitations.  Gastrointestinal:  Positive for nausea. Negative for abdominal pain and vomiting.  Genitourinary:  Negative for dysuria and hematuria.  Musculoskeletal:  Negative for arthralgias and back pain.  Skin:  Negative for color change and rash.  Neurological:  Negative for seizures and syncope.  All other systems reviewed and are negative.   Physical Exam Updated Vital  Signs BP (!) 156/69   Pulse 71   Temp 97.7 F (36.5 C)   Resp 15   Ht 1.753 m ('5\' 9"'$ )   Wt 81.6 kg   SpO2 97%   BMI 26.58 kg/m  Physical Exam Vitals and nursing note reviewed.  Constitutional:      General: He is not in acute distress.    Appearance: Normal appearance. He is well-developed. He is not toxic-appearing.  HENT:     Head: Normocephalic and atraumatic.  Eyes:     Extraocular Movements: Extraocular movements intact.     Conjunctiva/sclera: Conjunctivae normal.     Pupils: Pupils are equal, round, and reactive to light.  Cardiovascular:     Rate and Rhythm:  Bradycardia present. Rhythm irregular.     Heart sounds: No murmur heard. Pulmonary:     Effort: Pulmonary effort is normal. No respiratory distress.     Breath sounds: Normal breath sounds. No wheezing or rales.  Abdominal:     Palpations: Abdomen is soft.     Tenderness: There is no abdominal tenderness.  Musculoskeletal:        General: No swelling.     Cervical back: Normal range of motion and neck supple.     Right lower leg: No edema.     Left lower leg: No edema.  Skin:    General: Skin is warm and dry.     Capillary Refill: Capillary refill takes less than 2 seconds.  Neurological:     Mental Status: He is alert.  Psychiatric:        Mood and Affect: Mood normal.     ED Results / Procedures / Treatments   Labs (all labs ordered are listed, but only abnormal results are displayed) Labs Reviewed  BASIC METABOLIC PANEL - Abnormal; Notable for the following components:      Result Value   Glucose, Bld 115 (*)    All other components within normal limits  CBC  TSH  TROPONIN I (HIGH SENSITIVITY)    EKG None  Radiology DG Chest Port 1 View  Result Date: 10/16/2021 CLINICAL DATA:  Sweating, central chest pain and tightness EXAM: PORTABLE CHEST 1 VIEW COMPARISON:  None Available. FINDINGS: Cardiomediastinal silhouette is within normal limits in size. Elevated right hemidiaphragm with adjacent basilar atelectasis. Left lung is clear. No large effusion. No pneumothorax. No acute osseous abnormality. IMPRESSION: Elevated right hemidiaphragm with adjacent atelectasis. Electronically Signed   By: Maurine Simmering M.D.   On: 10/16/2021 12:38    Procedures Procedures    Medications Ordered in ED Medications  DOPamine (INTROPIN) 800 mg in dextrose 5 % 250 mL (3.2 mg/mL) infusion (has no administration in time range)    ED Course/ Medical Decision Making/ A&P                           Medical Decision Making Amount and/or Complexity of Data Reviewed Labs:  ordered. Radiology: ordered.  Risk Prescription drug management.   CRITICAL CARE Performed by: Fredia Sorrow Total critical care time: 45 minutes Critical care time was exclusive of separately billable procedures and treating other patients. Critical care was necessary to treat or prevent imminent or life-threatening deterioration. Critical care was time spent personally by me on the following activities: development of treatment plan with patient and/or surrogate as well as nursing, discussions with consultants, evaluation of patient's response to treatment, examination of patient, obtaining history from patient or surrogate, ordering and performing  treatments and interventions, ordering and review of laboratory studies, ordering and review of radiographic studies, pulse oximetry and re-evaluation of patient's condition.  Patient is EKG consistent with second-degree AV block Mobitz type II.  Patient also may very well had an acute cardiac event between 1 and 2 this morning may have been the cause of this.  Patient followed by [facility] cardiology for coronary disease and had a PCI in the past.  Patient does not normally have any chest pain.  This was way out of the ordinary.  Also possible patient may have had an acute cardiac event when this woke him with chest pain between 1:59 in the morning.  That could be responsible for the bradycardia.  Basic metabolic panel pending.  CBC normal no leukocytosis hemoglobin 13 troponin pending TSH pending that was at the request of cardiology chest x-ray elevated right hemidiaphragm no acute abnormalities.  Chest x-ray labs ordered.  Troponin still pending discussed with cardiology, Dr. Rex Kras.  He agrees that second-degree heart block Mobitz type II patient will have pacer pads placed.  We will hang dopamine as needed.  But will not start the drip.  Discussed with Dr. Billy Fischer ED physician at [facility] patient will be transferred to [facility] ED for [facility]  cardiology to see him.  Patient blood pressure stable at rest.   Final Clinical Impression(s) / ED Diagnoses Final diagnoses:  Second degree heart block  Precordial pain    Rx / DC Orders ED Discharge Orders     None         Fredia Sorrow, MD 10/16/21 1302

## 2021-10-16 NOTE — ED Triage Notes (Signed)
Patient reports to the ER for chest pain and new onset heart block. Reports he was sent by PCP. Patient is symptomatic with sweating and pain in central chest that he reports is pressure and tight.

## 2021-10-16 NOTE — H&P (Signed)
HISTORY AND PHYSICAL  Patient ID: Phillip Allen MRN: [id number] DOB/AGE: 1940-10-11 81 y.o.  Admit date: 10/16/2021 Attending physician: Rex Kras, DO, [facility] Primary Physician:  Fanny Bien, MD  Chief complaint: Chest Pain.   HPI:  Phillip Allen is a 81 y.o. male who presents with a chief complaint of "chest pain." His past medical history and cardiovascular risk factors include:  HTN, HLD, CAD, former smoker.  Patient was in his usual health yesterday playing pickle ball but woke up this morning at 1 AM out of his sleep with substernal/epigastric chest pain, intensity 8 out of 10, pressure/squeeze like sensation, lasted for about 30 minutes, associated symptoms included nausea and left arm pain.  He has not had exertional chest pain.  His symptoms resolved.  He was due for blood work at his PCPs office and mentioned the episode this morning.  Patient states that an EKG was performed and referred to the ED for further evaluation and management.   Patient presented to [place] ED was found to be in Mobitz type II AV block and transferred to [facility] for further evaluation and management.  Currently denies anginal chest pain or heart failure symptoms.  He has some residual lower extremity swelling.  Denies near-syncope or syncopal events.  But does experience lightheaded and dizziness with ambulation.   Had a left heart catheterization in 2010 and was noted to have RCA disease which was not intervened on results noted below for reference.   On bedside telemetry patient's ventricular rate ranges between 26 bpm to 43 bpm.  ALLERGIES: Allergies  Allergen Reactions   Penicillins Hives    PAST MEDICAL HISTORY: Past Medical History:  Diagnosis Date   Depression    Hyperlipidemia   Hypertension  PAST SURGICAL HISTORY: Past Surgical History:  Procedure Laterality Date   SHOULDER SURGERY Right 06/2017  Tonsillectomy Appendectomy Vocal chord cyst  UP3  FAMILY  HISTORY: The patient family history includes Breast cancer in his mother; Heart attack in his brother; Lung cancer in his father; Stomach cancer in his mother.   SOCIAL HISTORY:  The patient  reports that he quit smoking about 27 years ago. His smoking use included cigarettes. He has a 2.50 pack-year smoking history. He has never used smokeless tobacco. He reports that he does not currently use alcohol.  MEDICATIONS: Current Outpatient Medications  Medication Instructions   acetaminophen (TYLENOL) 650 mg, Oral, Every 12 hours, & up to 3 times daily as needed   amLODipine (NORVASC) 5 mg, Oral, Daily   diclofenac Sodium (VOLTAREN) 1 % GEL 1 application , Topical, 4 times daily   docusate sodium (COLACE) 100 mg, Oral, 2 times daily   Glucosamine-Chondroitin 250-200 MG TABS 1 tablet, Oral, 2 times daily   Krill Oil 500 MG CAPS Oral, Daily   losartan (COZAAR) 50 mg, Oral, Daily   meloxicam (MOBIC) 7.5 mg, Oral, 2 times daily   Multiple Vitamins-Minerals (MULTIVITAMIN MEN 50+ PO) Oral, Daily   nitroGLYCERIN (NITROSTAT) 0.4 mg, Sublingual, Every 5 min PRN   rosuvastatin (CRESTOR) 40 mg, Oral, Daily   vortioxetine HBr (TRINTELLIX) 20 mg, Oral, Daily     DOPamine      REVIEW OF SYSTEMS: Review of Systems  Cardiovascular:  Positive for chest pain. Negative for dyspnea on exertion, irregular heartbeat, leg swelling, near-syncope, orthopnea, palpitations, paroxysmal nocturnal dyspnea and syncope.  Gastrointestinal:  Positive for nausea. Negative for vomiting.       Epigastric pain.  Neurological:  Positive for dizziness and light-headedness.  All other systems reviewed and are negative.   PHYSICAL EXAM:    10/16/2021    3:30 PM 10/16/2021    3:15 PM 10/16/2021    3:00 PM  Vitals with BMI  Systolic 376 283 151  Diastolic 761 55 73  Pulse 56 52 31    No intake or output data in the 24 hours ending 10/16/21 1610  Net IO Since Admission: No IO data has been entered for this period  [10/16/21 1610] CONSTITUTIONAL: Age-appropriate, hemodynamically stable, well-developed and well-nourished. No acute distress.  SKIN: Skin is warm and dry. No rash noted. No cyanosis. No pallor. No jaundice HEAD: Normocephalic and atraumatic.  EYES: No scleral icterus MOUTH/THROAT: Moist oral membranes.  NECK: No JVD present. No thyromegaly noted. No carotid bruits  CHEST Normal respiratory effort. No intercostal retractions.  Transcutaneous pacer pads present. LUNGS: Clear to auscultation bilaterally.  No stridor. No wheezes. No rales.  CARDIOVASCULAR: Bradycardic, irregular, variable S1-S2, no murmurs rubs or gallops appreciated ABDOMINAL: Soft, nontender, nondistended, positive bowel sounds in all 4 quadrants, no apparent ascites.  EXTREMITIES: Trace bilateral pitting edema, warm to touch.  HEMATOLOGIC: No significant bruising NEUROLOGIC: Oriented to person, place, and time. Nonfocal. Normal muscle tone.  PSYCHIATRIC: Normal mood and affect. Normal behavior. Cooperative  RADIOLOGY: DG Chest Port 1 View  Result Date: 10/16/2021 CLINICAL DATA:  Sweating, central chest pain and tightness EXAM: PORTABLE CHEST 1 VIEW COMPARISON:  None Available. FINDINGS: Cardiomediastinal silhouette is within normal limits in size. Elevated right hemidiaphragm with adjacent basilar atelectasis. Left lung is clear. No large effusion. No pneumothorax. No acute osseous abnormality. IMPRESSION: Elevated right hemidiaphragm with adjacent atelectasis. Electronically Signed   By: Maurine Simmering M.D.   On: 10/16/2021 12:38    LABORATORY DATA: Lab Results  Component Value Date   WBC 8.5 10/16/2021   HGB 13.9 10/16/2021   HCT 43.2 10/16/2021   MCV 95.6 10/16/2021   PLT 259 10/16/2021    Recent Labs  Lab 10/16/21 1217  NA 141  K 4.6  CL 106  CO2 27  BUN 19  CREATININE 0.87  CALCIUM 9.8  GLUCOSE 115*    Lipid Panel  No results found for: "CHOL", "HDL", "LDLCALC", "LDLDIRECT", "TRIG", "CHOLHDL"  BNP (last  3 results) No results for input(s): "BNP" in the last 8760 hours.  HEMOGLOBIN A1C No results found for: "HGBA1C", "MPG"  Cardiac Panel (last 3 results) No results for input(s): "CKTOTAL", "CKMB", "RELINDX" in the last 8760 hours.  Invalid input(s): "TROPONINHS"  No results found for: "CKTOTAL", "CKMB", "CKMBINDEX"   TSH Recent Labs    10/16/21 1217  TSH 3.200      CARDIAC DATABASE: EKG: 10/16/2021: Sinus bradycardia, 48 bpm, left bundle branch block, secondary to type II AV block with type II AV block.   10/16/2021: Sinus bradycardia, 53 bpm, IVCD, high degree AV block.   Echocardiogram: PCV ECHOCARDIOGRAM COMPLETE 08/01/2020   Narrative Echocardiogram 08/01/2020: Normal LV systolic function with visual EF 60-65%. Left ventricle cavity is normal in size. Mild left ventricular hypertrophy. Normal global wall motion. Normal diastolic filling pattern, normal LAP. Aortic valve sclerosis without stenosis. Mild (Grade I) aortic regurgitation. Mild (Grade I) mitral regurgitation. Mild tricuspid regurgitation. No evidence of pulmonary hypertension. Compared to prior study dated 04/03/2017: Mild to moderate MR and TR are now mild, otherwise no significant change.   Stress Testing:  PCV MYOCARDIAL PERFUSION WITH LEXISCAN 08/06/2020   Narrative Lexiscan Tetrofosmin stress test 08/06/2020: Lexiscan nuclear stress test performed using 1-day protocol.  SPECT images show medium sized, mild intensity, reversible perfusion defect in mid to basal anteroseptal, inferoseptal myocardium. Stress LVEF 55%. Intermediate risk study.   Heart Catheterization: Coronary angiography 2010: LM: normal LAD: Ostial/prox 20% stenosis LCx: Normal RCA: Ostial PDA 60% stenosis          Occluded RPLB. Unsuccessful PCI attempt  IMPRESSION & RECOMMENDATIONS: Phillip Allen is a 81 y.o. Caucasian male whose past medical history and cardiovascular risk factors include: HTN, HLD, CAD, former smoker.    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 Has baseline conduction disease with first-degree AV block and left bundle branch block. On telemetry patient's ventricular rates ranges between 20-40 bpm. Complains of lightheaded and dizziness with ambulation No syncope or near syncope events Not on AV nodal blocking agents. Does take amlodipine for blood pressure management. Shared decision was to proceed with left and right heart catheterization to evaluate for CAD with possible intervention followed by transvenous pacer.   The procedure of left and right heart catheterization with possible intervention followed by transvenous pacemaker  due to symptomatic bradycardia was explained to the patient and significant other Helene Kelp) in detail.  The indication, alternatives, risks and benefits were reviewed.  Complications include but not limited to bleeding, infection, vascular injury, stroke, myocardial infarction, arrhythmia (requiring medical or cardiopulmonary resuscitation), kidney injury (requiring short-term or long-term hemodialysis), radiation-related injury in the case of prolonged fluoroscopy use, emergent cardiac surgery, temporary or permanent pacemaker, pericardial effusion, emergent surgery, pneumothorax, blood loss requiring blood transfusion, and death. The patient and Helene Kelp understands the risks of serious complication is 1-2 in 1610 with diagnostic cardiac cath and 1-2% or less with angioplasty/stenting.    Precordial/epigastric pain: Known history of RCA disease based on cath from 2010. Initial troponin negative x1 No active chest pain Prior to committing to device therapy recommend ruling out obstructive CAD that may be contributing to his presentation.   Second-degree  type II AV block with evidence of high degree AV block as well: Plan temporary venous pacer.  Hold amlodipine.  TSH within normal limits.  We will evaluate for reversible causes.  May eventually need EP consultation.   Consultants: None   Code Status: Full    Family Communication: Significant other, Helene Kelp present at bedside   Disposition Plan: Home when medically stable   CRITICAL CARE Performed by: Rex Kras   Total critical care time: 46 minutes   Critical care time was exclusive of separately billable procedures and treating other patients.   Critical care was necessary to treat or prevent imminent or life-threatening deterioration.   Critical care was time spent personally by me on the following activities related to his precordial pain and symptomatic bradycardia due to high degree AV block: development of treatment plan with patient and/or surrogate as well as nursing, discussions with consultants, evaluation of patient's response to treatment, examination of patient, obtaining history from patient or surrogate, ordering and performing treatments and interventions, ordering and review of laboratory studies, ordering and review of radiographic studies, pulse oximetry and re-evaluation of patient's condition.   This note was created using a voice recognition software as a result there may be grammatical errors inadvertently enclosed that do not reflect the nature of this encounter. Every attempt is made to correct such errors..  Of Note, patient was seen in consultation therefore, there  is a consult note. He is being admitted under our service so I have copied the content over into this H&P for documentation and completion of EMR. Since critical care time was already billed when initial service was rendered I will not submit charges for this H&P.   Mechele Claude [facility]  Pager: [phone] Office: [phone] 10/16/2021, 4:10 PM

## 2021-10-16 NOTE — Consult Note (Addendum)
CARDIOLOGY H&P NOTE  Patient ID: Phillip Allen MRN: [id number] DOB/AGE: 79-06-1940 79 y.o.  Admit date: 10/16/2021 Attending physician: Fredia Sorrow, MD Primary Physician:  Fanny Bien, MD Outpatient Cardiologist: Dr. Vernell Leep Inpatient Cardiologist: Rex Kras, DO, [facility]  Reason of consultation: Chest pain and heart block Referring physician: Fredia Sorrow, MD  Chief complaint: Chest pain  HPI:  Phillip Allen is a 81 y.o. Caucasian male who presents with a chief complaint of " chest pain." His past medical history and cardiovascular risk factors include: HTN, HLD, CAD, former smoker.  Patient was in his usual health yesterday playing pickle ball but woke up this morning at 1 AM out of his sleep with substernal/epigastric chest pain, intensity 8 out of 10, pressure/squeeze like sensation, lasted for about 30 minutes, associated symptoms included nausea and left arm pain.  He has not had exertional chest pain.  His symptoms resolved.  He was due for blood work at his PCPs office and mentioned the episode this morning.  Patient states that an EKG was performed and referred to the ED for further evaluation and management.  Patient presented to [place] ED was found to be in Mobitz type II AV block and transferred to [facility] for further evaluation and management.  Currently denies anginal chest pain or heart failure symptoms.  He has some residual lower extremity swelling.  Denies near-syncope or syncopal events.  But does experience lightheaded and dizziness with ambulation.  Had a left heart catheterization in 2010 and was noted to have RCA disease which was not intervened on results noted below for reference.  On bedside telemetry patient's ventricular rate ranges between 26 bpm to 43 bpm.  ALLERGIES: Allergies  Allergen Reactions   Penicillins Hives    PAST MEDICAL HISTORY: Past Medical History:  Diagnosis Date   Depression    Hyperlipidemia    Hypertension   PAST SURGICAL HISTORY: Past Surgical History:  Procedure Laterality Date   SHOULDER SURGERY Right 06/2017  Tonsillectomy Appendectomy Vocal chord cyst  UP3  FAMILY HISTORY: The patient's family history includes Breast cancer in his mother; Heart attack in his brother; Lung cancer in his father; Stomach cancer in his mother.   SOCIAL HISTORY:  The patient  reports that he quit smoking about 27 years ago. His smoking use included cigarettes. He has a 2.50 pack-year smoking history. He has never used smokeless tobacco. He reports that he does not currently use alcohol.  MEDICATIONS: Current Outpatient Medications  Medication Instructions   acetaminophen (TYLENOL) 650 mg, Oral, Every 12 hours, & up to 3 times daily as needed   amLODipine (NORVASC) 5 mg, Oral, Daily   diclofenac Sodium (VOLTAREN) 1 % GEL 1 application , Topical, 4 times daily   docusate sodium (COLACE) 100 mg, Oral, 2 times daily   Glucosamine-Chondroitin 250-200 MG TABS 1 tablet, Oral, 2 times daily   Krill Oil 500 MG CAPS Oral, Daily   losartan (COZAAR) 50 mg, Oral, Daily   meloxicam (MOBIC) 7.5 mg, Oral, 2 times daily   Multiple Vitamins-Minerals (MULTIVITAMIN MEN 50+ PO) Oral, Daily   nitroGLYCERIN (NITROSTAT) 0.4 mg, Sublingual, Every 5 min PRN   rosuvastatin (CRESTOR) 40 mg, Oral, Daily   vortioxetine HBr (TRINTELLIX) 20 mg, Oral, Daily    REVIEW OF SYSTEMS: Review of Systems  Cardiovascular:  Positive for chest pain and leg swelling. Negative for dyspnea on exertion, near-syncope, orthopnea, palpitations, paroxysmal nocturnal dyspnea and syncope.  Respiratory:  Negative for shortness of breath.   Gastrointestinal:  Positive for nausea.       Epigastric pain  Neurological:  Positive for dizziness and light-headedness.  All other systems reviewed and are negative.   PHYSICAL EXAM:    10/16/2021    2:15 PM 10/16/2021    2:00 PM 10/16/2021   12:30 PM  Vitals with BMI  Systolic 962 836  629  Diastolic 476 57 69  Pulse 54 65 71    No intake or output data in the 24 hours ending 10/16/21 1501  Net IO Since Admission: No IO data has been entered for this period [10/16/21 1501]  CONSTITUTIONAL: Age-appropriate, hemodynamically stable, well-developed and well-nourished. No acute distress.  SKIN: Skin is warm and dry. No rash noted. No cyanosis. No pallor. No jaundice HEAD: Normocephalic and atraumatic.  EYES: No scleral icterus MOUTH/THROAT: Moist oral membranes.  NECK: No JVD present. No thyromegaly noted. No carotid bruits  CHEST Normal respiratory effort. No intercostal retractions.  Transcutaneous pacer pads present. LUNGS: Clear to auscultation bilaterally.  No stridor. No wheezes. No rales.  CARDIOVASCULAR: Bradycardic, irregular, variable S1-S2, no murmurs rubs or gallops appreciated ABDOMINAL: Soft, nontender, nondistended, positive bowel sounds in all 4 quadrants, no apparent ascites.  EXTREMITIES: Trace bilateral pitting edema, warm to touch.  HEMATOLOGIC: No significant bruising NEUROLOGIC: Oriented to person, place, and time. Nonfocal. Normal muscle tone.  PSYCHIATRIC: Normal mood and affect. Normal behavior. Cooperative  RADIOLOGY: DG Chest Port 1 View  Result Date: 10/16/2021 CLINICAL DATA:  Sweating, central chest pain and tightness EXAM: PORTABLE CHEST 1 VIEW COMPARISON:  None Available. FINDINGS: Cardiomediastinal silhouette is within normal limits in size. Elevated right hemidiaphragm with adjacent basilar atelectasis. Left lung is clear. No large effusion. No pneumothorax. No acute osseous abnormality. IMPRESSION: Elevated right hemidiaphragm with adjacent atelectasis. Electronically Signed   By: Maurine Simmering M.D.   On: 10/16/2021 12:38    LABORATORY DATA: Lab Results  Component Value Date   WBC 8.5 10/16/2021   HGB 13.9 10/16/2021   HCT 43.2 10/16/2021   MCV 95.6 10/16/2021   PLT 259 10/16/2021    Recent Labs  Lab 10/16/21 1217  NA 141  K 4.6   CL 106  CO2 27  BUN 19  CREATININE 0.87  CALCIUM 9.8  GLUCOSE 115*    Lipid Panel  No results found for: "CHOL", "HDL", "LDLCALC", "LDLDIRECT", "TRIG", "CHOLHDL"  BNP (last 3 results) No results for input(s): "BNP" in the last 8760 hours.  HEMOGLOBIN A1C No results found for: "HGBA1C", "MPG"  Cardiac Panel (last 3 results) Recent Labs    10/16/21 1217 10/16/21 1402  TROPONINIHS 11 12     TSH Recent Labs    10/16/21 1217  TSH 3.200     CARDIAC DATABASE: EKG: 10/16/2021: Sinus bradycardia, 48 bpm, left bundle branch block, secondary to type II AV block with type II AV block.  10/16/2021: Sinus bradycardia, 53 bpm, IVCD, high degree AV block.  Echocardiogram: PCV ECHOCARDIOGRAM COMPLETE 08/01/2020  Narrative Echocardiogram 08/01/2020: Normal LV systolic function with visual EF 60-65%. Left ventricle cavity is normal in size. Mild left ventricular hypertrophy. Normal global wall motion. Normal diastolic filling pattern, normal LAP. Aortic valve sclerosis without stenosis. Mild (Grade I) aortic regurgitation. Mild (Grade I) mitral regurgitation. Mild tricuspid regurgitation. No evidence of pulmonary hypertension. Compared to prior study dated 04/03/2017: Mild to moderate MR and TR are now mild, otherwise no significant change.   Stress Testing:  PCV MYOCARDIAL PERFUSION WITH LEXISCAN 08/06/2020  Narrative Lexiscan Tetrofosmin stress test 08/06/2020: Carl[facility] Adam nuclear  stress test performed using 1-day protocol. SPECT images show medium sized, mild intensity, reversible perfusion defect in mid to basal anteroseptal, inferoseptal myocardium. Stress LVEF 55%. Intermediate risk study.  Heart Catheterization: Coronary angiography 2010: LM: normal LAD: Ostial/prox 20% stenosis LCx: Normal RCA: Ostial PDA 60% stenosis          Occluded RPLB. Unsuccessful PCI attempt  IMPRESSION & RECOMMENDATIONS: Phillip Allen is a 81 y.o. Caucasian male whose past medical history  and cardiovascular risk factors include: HTN, HLD, CAD, former smoker.  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 Has baseline conduction disease with first-degree AV block and left bundle branch block. On telemetry patient's ventricular rates ranges between 20-40 bpm. Complains of lightheaded and dizziness with ambulation No syncope or near syncope events Not on AV nodal blocking agents. Does take amlodipine for blood pressure management. Shared decision was to proceed with left and right heart catheterization to evaluate for CAD with possible intervention followed by transvenous pacer.  The procedure of left and right heart catheterization with possible intervention followed by transvenous pacemaker  due to symptomatic bradycardia was explained to the patient and significant other Helene Kelp) in detail.  The indication, alternatives, risks and benefits were reviewed.  Complications include but not limited to bleeding, infection, vascular injury, stroke, myocardial infarction, arrhythmia (requiring medical or cardiopulmonary resuscitation), kidney injury (requiring short-term or long-term hemodialysis), radiation-related injury in the case of prolonged fluoroscopy use, emergent cardiac surgery, temporary or permanent pacemaker, pericardial effusion, emergent surgery, pneumothorax, blood loss requiring blood transfusion, and death. The patient and Helene Kelp understands the risks of serious complication is 1-2 in 6256 with diagnostic cardiac cath and 1-2% or less with angioplasty/stenting.   Precordial/epigastric pain: Known history of RCA disease based on cath from 2010. Initial troponin negative x1 No active chest pain Prior to committing to device therapy recommend ruling out obstructive  CAD that may be contributing to his presentation.  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  Hold amlodipine.  TSH within normal limits.  We will evaluate for reversible causes.  May eventually need EP consultation.    CRITICAL CARE Performed by: Rex Kras   Total critical care time: 46 minutes   Critical care time was exclusive of separately billable procedures and treating other patients.   Critical care was necessary to treat or prevent imminent or life-threatening deterioration.   Critical care was time spent personally by me on the following activities related to his precordial pain and symptomatic bradycardia due to high degree AV block: development of treatment plan with patient and/or surrogate as well as nursing, discussions with consultants, evaluation of patient's response to treatment, examination of patient, obtaining history from patient or surrogate, ordering and performing treatments and interventions, ordering and review of laboratory studies, ordering and review of radiographic studies, pulse oximetry and re-evaluation of patient's condition.  This note was created using a voice recognition software as a result there may be grammatical errors inadvertently enclosed that do not reflect the nature of this encounter. Every attempt is made to correct such errors.  Mechele Claude [facility]  Pager: [phone] Office: [phone] 10/16/2021, 3:01 PM

## 2021-10-16 NOTE — ED Provider Notes (Signed)
Patient transferred from [facility] to see cardiology.  Patient presented with chest discomfort and nausea.  His symptoms have improved.  Patient had an EKG that was ordered and reviewed showing signs of type II heart block.  Patient had coronary artery disease history but no stents.  On exam patient's heart rate is ranging 30s to 40s, monitor reviewed.  EKG repeated showing type II heart block with dropped beats, mild prolonged QT, no ST elevation.  Discussed with cardiology for urgent consult and discussed with patient he may need pacemaker but further discussion required.  Patient blood pressure normal at this time.  Dopamine at bedside but will hold at this time.  .Critical Care  Performed by: Elnora Morrison, MD Authorized by: Elnora Morrison, MD   Critical care provider statement:    Critical care time (minutes):  30   Critical care start time:  10/16/2021 2:15 PM   Critical care was time spent personally by me on the following activities:  Development of treatment plan with patient or surrogate, discussions with consultants, examination of patient, ordering and review of laboratory studies, ordering and performing treatments and interventions, pulse oximetry, re-evaluation of patient's condition and review of old charts     Elnora Morrison, MD 10/23/21 [phone]

## 2021-10-16 NOTE — Interval H&P Note (Signed)
History and Physical Interval Note:  10/16/2021 4:16 PM  Phillip Allen  has presented today for surgery, with the diagnosis of chest pain.  The various methods of treatment have been discussed with the patient and family. After consideration of risks, benefits and other options for treatment, the patient has consented to  Procedure(s): LEFT HEART CATH AND CORONARY ANGIOGRAPHY (N/A) as a surgical intervention.  The patient's history has been reviewed, patient examined, no change in status, stable for surgery.  I have reviewed the patient's chart and labs.  Questions were answered to the patient's satisfaction.    Patient also consented for temporary pacemaker catheter placement.  [place]

## 2021-10-16 NOTE — Interval H&P Note (Signed)
History and Physical Interval Note:  10/16/2021 4:17 PM  Phillip Allen  has presented today for surgery, with the diagnosis of chest pain.  The various methods of treatment have been discussed with the patient and family. After consideration of risks, benefits and other options for treatment, the patient has consented to  Procedure(s): LEFT HEART CATH AND CORONARY ANGIOGRAPHY (N/A) as a surgical intervention.  The patient's history has been reviewed, patient examined, no change in status, stable for surgery.  I have reviewed the patient's chart and labs.  Questions were answered to the patient's satisfaction.    2016 Appropriate Use Criteria for Coronary Revascularization in Patients With Acute Coronary Syndrome NSTEMI/Unstable angina, stabilized patient at Intermediate Risk (TIMI Score 3-4) Indication:  Revascularization by PCI or CABG of 1 or more arteries in a patient with NSTEMI or unstable angina with Stabilization after presentation Intermediate risk for clinical events A (7) Indication: 16; Score 7   [place]

## 2021-10-16 NOTE — ED Notes (Signed)
Per Dr. Rogene Houston Dopamine hanging but not started. Pt placed on Zoll

## 2021-10-17 ENCOUNTER — Encounter (HOSPITAL_COMMUNITY)
Admission: EM | Disposition: A | Discharge status: Home / Self Care | Payer: Self-pay | Source: Home / Self Care | Attending: Cardiology

## 2021-10-17 ENCOUNTER — Encounter (HOSPITAL_COMMUNITY): Payer: Self-pay | Admitting: Cardiology

## 2021-10-17 DIAGNOSIS — I441 Atrioventricular block, second degree: Secondary | ICD-10-CM | POA: Diagnosis not present

## 2021-10-17 HISTORY — PX: PACEMAKER IMPLANT: EP1218

## 2021-10-17 LAB — CBC
HCT: 39.1 % (ref 39.0–52.0)
Hemoglobin: 13.1 g/dL (ref 13.0–17.0)
MCH: 31.5 pg (ref 26.0–34.0)
MCHC: 33.5 g/dL (ref 30.0–36.0)
MCV: 94 fL (ref 80.0–100.0)
Platelets: 221 10*3/uL (ref 150–400)
RBC: 4.16 MIL/uL — ABNORMAL LOW (ref 4.22–5.81)
RDW: 12.4 % (ref 11.5–15.5)
WBC: 8 10*3/uL (ref 4.0–10.5)
nRBC: 0 % (ref 0.0–0.2)

## 2021-10-17 LAB — BASIC METABOLIC PANEL
Anion gap: 10 (ref 5–15)
BUN: 14 mg/dL (ref 8–23)
CO2: 22 mmol/L (ref 22–32)
Calcium: 8.5 mg/dL — ABNORMAL LOW (ref 8.9–10.3)
Chloride: 107 mmol/L (ref 98–111)
Creatinine, Ser: 0.64 mg/dL (ref 0.61–1.24)
GFR, Estimated: >60 mL/min (ref >60)
Glucose, Bld: 97 mg/dL (ref 70–99)
Potassium: 3.8 mmol/L (ref 3.5–5.1)
Sodium: 139 mmol/L (ref 135–145)

## 2021-10-17 LAB — MAGNESIUM: Magnesium: 2 mg/dL (ref 1.7–2.4)

## 2021-10-17 LAB — ECHOCARDIOGRAM COMPLETE
Height: 69 in
S' Lateral: 2.7 cm
Weight: 2880 oz

## 2021-10-17 LAB — PHOSPHORUS: Phosphorus: 3.5 mg/dL (ref 2.5–4.6)

## 2021-10-17 SURGERY — PACEMAKER IMPLANT

## 2021-10-17 MED ORDER — MIDAZOLAM HCL 5 MG/5ML IJ SOLN
INTRAMUSCULAR | Status: DC | PRN
  Administered 2021-10-17: 2 mg via INTRAVENOUS

## 2021-10-17 MED ORDER — SODIUM CHLORIDE 0.9 % IV SOLN: INTRAVENOUS | Status: DC

## 2021-10-17 MED ORDER — SODIUM CHLORIDE 0.9 % IV SOLN
80.0000 mg | INTRAVENOUS | Status: AC
  Administered 2021-10-17: 80 mg
  Filled 2021-10-17: qty 2

## 2021-10-17 MED ORDER — FENTANYL CITRATE (PF) 100 MCG/2ML IJ SOLN
INTRAMUSCULAR | Status: DC | PRN
  Administered 2021-10-17: 25 ug via INTRAVENOUS

## 2021-10-17 MED ORDER — VANCOMYCIN HCL IN DEXTROSE 1-5 GM/200ML-% IV SOLN
1000.0000 mg | Freq: Once | INTRAVENOUS | Status: AC
  Administered 2021-10-17: 1000 mg via INTRAVENOUS
  Filled 2021-10-17: qty 200

## 2021-10-17 MED ORDER — CHLORHEXIDINE GLUCONATE 4 % EX LIQD
60.0000 mL | Freq: Once | CUTANEOUS | Status: AC
  Administered 2021-10-17: 4 via TOPICAL
  Filled 2021-10-17: qty 15

## 2021-10-17 MED ORDER — ACETAMINOPHEN 325 MG PO TABS: 325.0000 mg | ORAL_TABLET | ORAL | Status: DC | PRN

## 2021-10-17 MED ORDER — VANCOMYCIN HCL IN DEXTROSE 1-5 GM/200ML-% IV SOLN
1000.0000 mg | INTRAVENOUS | Status: AC
  Administered 2021-10-17: 1000 mg via INTRAVENOUS

## 2021-10-17 MED ORDER — SODIUM CHLORIDE 0.9 % IV SOLN
INTRAVENOUS | Status: AC
  Filled 2021-10-17: qty 2

## 2021-10-17 MED ORDER — ORAL CARE MOUTH RINSE: 15.0000 mL | OROMUCOSAL | Status: DC | PRN

## 2021-10-17 MED ORDER — HEPARIN (PORCINE) IN NACL 1000-0.9 UT/500ML-% IV SOLN
INTRAVENOUS | Status: AC
  Filled 2021-10-17: qty 500

## 2021-10-17 MED ORDER — FENTANYL CITRATE (PF) 100 MCG/2ML IJ SOLN
INTRAMUSCULAR | Status: AC
  Filled 2021-10-17: qty 2

## 2021-10-17 MED ORDER — MIDAZOLAM HCL 5 MG/5ML IJ SOLN
INTRAMUSCULAR | Status: AC
Start: 2021-10-17 — End: ?
  Filled 2021-10-17: qty 5

## 2021-10-17 MED ORDER — VANCOMYCIN HCL IN DEXTROSE 1-5 GM/200ML-% IV SOLN
INTRAVENOUS | Status: AC
  Filled 2021-10-17: qty 200

## 2021-10-17 MED ORDER — LIDOCAINE HCL (PF) 1 % IJ SOLN
INTRAMUSCULAR | Status: AC
  Filled 2021-10-17: qty 60

## 2021-10-17 MED ORDER — LIDOCAINE HCL (PF) 1 % IJ SOLN
INTRAMUSCULAR | Status: DC | PRN
  Administered 2021-10-17: 60 mL

## 2021-10-17 MED ORDER — ASPIRIN 81 MG PO CHEW
CHEWABLE_TABLET | ORAL | Status: DC | PRN
  Administered 2021-10-16: 81 mg via ORAL

## 2021-10-17 MED ORDER — HEPARIN (PORCINE) IN NACL 1000-0.9 UT/500ML-% IV SOLN
INTRAVENOUS | Status: DC | PRN
  Administered 2021-10-17: 500 mL

## 2021-10-17 MED ORDER — ONDANSETRON HCL 4 MG/2ML IJ SOLN: 4.0000 mg | Freq: Four times a day (QID) | INTRAMUSCULAR | Status: DC | PRN

## 2021-10-17 MED ORDER — METOPROLOL SUCCINATE ER 25 MG PO TB24
25.0000 mg | ORAL_TABLET | Freq: Every day | ORAL | Status: DC
  Administered 2021-10-17: 25 mg via ORAL
  Filled 2021-10-17: qty 1

## 2021-10-17 MED FILL — Heparin Sod (Porcine)-NaCl IV Soln 1000 Unit/500ML-0.9%: INTRAVENOUS | Qty: 500 | Status: AC

## 2021-10-17 SURGICAL SUPPLY — 12 items
CABLE SURGICAL S-101-97-12 (CABLE) ×2 IMPLANT
CATH RIGHTSITE C315HIS02 (CATHETERS) ×1 IMPLANT
IPG PACE AZUR XT DR MRI W1DR01 (Pacemaker) IMPLANT
LEAD CAPSURE NOVUS 5076-52CM (Lead) ×1 IMPLANT
LEAD SELECT SECURE 3830 383069 (Lead) IMPLANT
PACE AZURE XT DR MRI W1DR01 (Pacemaker) ×2 IMPLANT
PAD DEFIB RADIO PHYSIO CONN (PAD) ×2 IMPLANT
SELECT SECURE 3830 383069 (Lead) ×2 IMPLANT
SHEATH 7FR PRELUDE SNAP 13 (SHEATH) ×2 IMPLANT
SLITTER 6232ADJ (MISCELLANEOUS) ×1 IMPLANT
TRAY PACEMAKER INSERTION (PACKS) ×2 IMPLANT
WIRE HI TORQ VERSACORE-J 145CM (WIRE) ×1 IMPLANT

## 2021-10-17 NOTE — H&P (View-Only) (Signed)
ELECTROPHYSIOLOGY CONSULT NOTE    Patient ID: Phillip Allen MRN: [id number], DOB/AGE: [birth year] 81 y.o.  Admit date: 10/16/2021 Date of Consult: 10/17/2021  Primary Physician: Fanny Bien, MD Primary Cardiologist: None  Electrophysiologist:  New to Dr. Lovena Le  Referring Provider: Dr. Virgina Jock  Patient Profile: Phillip Allen is a 81 y.o. male with a history of HTN, HLD, CAD, former smoker, and LBBB  who is being seen today for the evaluation of symptomatic bradycardia at the request of Dr. Terri Skains.  HPI:  Phillip Allen is a 81 y.o. male with medical history as above.  Patient was in his usual health 6/13 playing pickle ball but woke up am of 6/14 at 1 AM out of his sleep with substernal/epigastric chest pain, intensity 8 out of 10, pressure/squeeze like sensation, lasted for about 30 minutes, associated symptoms included nausea and left arm pain.  He has not had exertional chest pain.  His symptoms resolved.  He was due for blood work at his PCPs office and mentioned the episode this morning.  Patient states that an EKG was performed and referred to the ED for further evaluation and management.   Patient presented to [facility] ED was found to be in Mobitz type II AV block and transferred to [facility] for further evaluation and management.  Currently denies anginal chest pain or heart failure symptoms.  He has some residual lower extremity swelling.  Denies near-syncope or syncopal events.  But does experience lightheaded and dizziness with ambulation.    Had a left heart catheterization in 2010 and was noted to have RCA disease which was not intervened on results noted below for reference.  Underwent cath by Dr. Virgina Jock which showed:   RPAV lesion is 100% stenosed.   1st Mrg lesion is 100% stenosed.   Ost Cx lesion is 50% stenosed.   Prox LAD lesion is 40% stenosed.   LV end diastolic pressure is mildly elevated.   LM Normal LAD: Prox 40% disease LCx:  Ostial 50% stenosis        PM1 prox occlusion, possibly subacute      RCA: RPLB flush occlusion (chronic)   Attempted but unsuccessful percutaneous coronary intervention OM1   With no options for intervention, EP asked to see for PPM consideration  Overnight, continues to have intermittent second degree AV block with intermittent V pacing.   He is feeling OK at rest. Recounts history above and states he has felt overall more fatigue for the past several months. No frank syncope.   Past Medical History:  Diagnosis Date   Depression    Hyperlipidemia      Surgical History:  Past Surgical History:  Procedure Laterality Date   CORONARY STENT INTERVENTION N/A 10/16/2021   Procedure: CORONARY STENT INTERVENTION;  Surgeon: [name], MD;  Location: [place] [facility];  Service: Cardiovascular;  Laterality: N/A;   LEFT HEART CATH AND CORONARY ANGIOGRAPHY N/A 10/16/2021   Procedure: LEFT HEART CATH AND CORONARY ANGIOGRAPHY;  Surgeon: [name], MD;  Location: [place] [facility];  Service: Cardiovascular;  Laterality: N/A;   SHOULDER SURGERY Right 06/2017   TEMPORARY PACEMAKER N/A 10/16/2021   Procedure: TEMPORARY PACEMAKER;  Surgeon: [name], MD;  Location: [place] [facility];  Service: Cardiovascular;  Laterality: N/A;     Medications Prior to Admission  Medication Sig Dispense Refill Last Dose   acetaminophen (TYLENOL) 650 MG CR tablet Take 650 mg by mouth every 12 (twelve) hours. & up to  3 times daily as needed   10/15/2021   amLODipine (NORVASC) 5 MG tablet Take 1 tablet (5 mg total) by mouth daily. 90 tablet 3 10/15/2021   diclofenac Sodium (VOLTAREN) 1 % GEL Apply 1 application topically 4 (four) times daily.   10/15/2021   docusate sodium (COLACE) 100 MG capsule Take 100 mg by mouth 2 (two) times daily.   10/15/2021   Glucosamine-Chondroitin 250-200 MG TABS Take 1 tablet by mouth 2 (two) times daily.   10/15/2021   Krill Oil 500 MG CAPS Take by mouth  daily.   10/15/2021   losartan (COZAAR) 50 MG tablet TAKE 1 TABLET (50 MG TOTAL) BY MOUTH DAILY. 90 tablet 1 10/15/2021   meloxicam (MOBIC) 7.5 MG tablet Take 7.5 mg by mouth 2 (two) times daily.   10/15/2021   Multiple Vitamins-Minerals (MULTIVITAMIN MEN 50+ PO) Take by mouth daily.   10/15/2021   rosuvastatin (CRESTOR) 40 MG tablet Take 40 mg by mouth daily.   10/15/2021   vortioxetine HBr (TRINTELLIX) 20 MG TABS tablet Take 20 mg by mouth daily.   10/15/2021   nitroGLYCERIN (NITROSTAT) 0.4 MG SL tablet Place 1 tablet (0.4 mg total) under the tongue every 5 (five) minutes as needed for chest pain. 30 tablet 3     Inpatient Medications:   acetaminophen  650 mg Oral Q12H   Chlorhexidine Gluconate Cloth  6 each Topical Q0600   docusate sodium  100 mg Oral BID   meloxicam  7.5 mg Oral BID   rosuvastatin  40 mg Oral Daily   sodium chloride flush  3 mL Intravenous Q12H   vortioxetine HBr  20 mg Oral Daily    Allergies:  Allergies  Allergen Reactions   Penicillins Hives    Social History   Socioeconomic History   Marital status: Widowed    Spouse name: Not on file   Number of children: 2   Years of education: Not on file   Highest education level: Not on file  Occupational History   Not on file  Tobacco Use   Smoking status: Former    Packs/day: 0.25    Years: 10.00    Total pack years: 2.50    Types: Cigarettes    Quit date: 43    Years since quitting: 27.4   Smokeless tobacco: Never   Tobacco comments:    on/off since age 13  Vaping Use   Vaping Use: Never used  Substance and Sexual Activity   Alcohol use: Not Currently    Comment: has not drank any alcohol since Oct 2021   Drug use: Not on file   Sexual activity: Not on file  Other Topics Concern   Not on file  Social History Narrative   Not on file   Social Determinants of Health   Financial Resource Strain: Not on file  Food Insecurity: Not on file  Transportation Needs: Not on file  Physical Activity: Not  on file  Stress: Not on file  Social Connections: Not on file  Intimate Partner Violence: Not on file     Family History  Problem Relation Age of Onset   Breast cancer Mother    Stomach cancer Mother    Lung cancer Father    Heart attack Brother      Review of Systems: All other systems reviewed and are otherwise negative except as noted above.  Physical Exam: Vitals:   10/17/21 0300 10/17/21 0400 10/17/21 0500 10/17/21 0600  BP: 117/62 122/64 (!) 116/58 Marland Kitchen)  116/58  Pulse: (!) 49 (!) 51 (!) 50 (!) 54  Resp: 14 (!) '27 14 12  '$ Temp: 97.7 F (36.5 C)     TempSrc: Oral     SpO2: 96% 94% 93% 95%  Weight:   81.7 kg   Height:        GEN- The patient is well appearing, alert and oriented x 3 today.   HEENT: normocephalic, atraumatic; sclera clear, conjunctiva pink; hearing intact; oropharynx clear; neck supple Lungs- Clear to ausculation bilaterally, normal work of breathing.  No wheezes, rales, rhonchi Heart- Regular rate and rhythm, no murmurs, rubs or gallops GI- soft, non-tender, non-distended, bowel sounds present Extremities- no clubbing, cyanosis, or edema; DP/PT/radial pulses 2+ bilaterally MS- no significant deformity or atrophy Skin- warm and dry, no rash or lesion Psych- euthymic mood, full affect Neuro- strength and sensation are intact  Labs:   Lab Results  Component Value Date   WBC 8.0 10/17/2021   HGB 13.1 10/17/2021   HCT 39.1 10/17/2021   MCV 94.0 10/17/2021   PLT 221 10/17/2021    Recent Labs  Lab 10/17/21 0204  NA 139  K 3.8  CL 107  CO2 22  BUN 14  CREATININE 0.64  CALCIUM 8.5*  GLUCOSE 97      Radiology/Studies: ECHOCARDIOGRAM COMPLETE  Result Date: 10/17/2021    ECHOCARDIOGRAM REPORT   Patient Name:   Phillip Allen Date of Exam: 10/16/2021 Medical Rec #:  [id number]     Height:       69.0 in Accession #:    [id number]    Weight:       180.0 lb Date of Birth:  [birth year]     BSA:          1.976 m Patient Age:    70 years      BP:            142/55 mmHg Patient Gender: M             HR:           35 bpm. Exam Location:  Inpatient Procedure: 2D Echo, Cardiac Doppler and Color Doppler Indications:    Chest pain  History:        Patient has prior history of Echocardiogram examinations.                 Signs/Symptoms:Chest Pain.  Sonographer:    Danne Baxter RDCS, FE, PE Referring Phys: [id number]  1. Left ventricular ejection fraction, by estimation, is 60 to 65%. The left ventricle has normal function. The left ventricle has no regional wall motion abnormalities. Left ventricular diastolic function could not be evaluated.  2. Right ventricular systolic function is normal. The right ventricular size is normal.  3. Left atrial size was mildly dilated.  4. Right atrial size was mildly dilated.  5. The mitral valve is normal in structure. Mild mitral valve regurgitation. No evidence of mitral stenosis.  6. Tricuspid valve regurgitation is moderate.  7. Inadequate Doppler assessment of aortic valve, unlikely to have signifciant stenosis.. The aortic valve is tricuspid. There is mild calcification of the aortic valve. Aortic valve regurgitation is mild. Aortic valve sclerosis is present, with no evidence of aortic valve stenosis. Comparison(s): No significant change compared to previous study on 08/01/2020. FINDINGS  Left Ventricle: Left ventricular ejection fraction, by estimation, is 60 to 65%. The left ventricle has normal function. The left ventricle has no regional wall motion abnormalities. The left ventricular  internal cavity size was normal in size. There is  no left ventricular hypertrophy. Left ventricular diastolic function could not be evaluated. Right Ventricle: The right ventricular size is normal. No increase in right ventricular wall thickness. Right ventricular systolic function is normal. Left Atrium: Left atrial size was mildly dilated. Right Atrium: Right atrial size was mildly dilated. Pericardium: There is no  evidence of pericardial effusion. Mitral Valve: The mitral valve is normal in structure. Mild mitral valve regurgitation. No evidence of mitral valve stenosis. Tricuspid Valve: The tricuspid valve is normal in structure. Tricuspid valve regurgitation is moderate . No evidence of tricuspid stenosis. Aortic Valve: Inadequate Doppler assessment of aortic valve, unlikely to have signifciant stenosis. The aortic valve is tricuspid. There is mild calcification of the aortic valve. Aortic valve regurgitation is mild. Aortic valve sclerosis is present, with no evidence of aortic valve stenosis. Pulmonic Valve: The pulmonic valve was normal in structure. Pulmonic valve regurgitation is not visualized. No evidence of pulmonic stenosis. Aorta: The aortic root is normal in size and structure. IAS/Shunts: The interatrial septum was not assessed.  LEFT VENTRICLE PLAX 2D LVIDd:         4.10 cm LVIDs:         2.70 cm LV PW:         1.00 cm LV IVS:        0.80 cm LVOT diam:     2.80 cm LV SV:         218 LV SV Index:   110 LVOT Area:     6.16 cm  RIGHT VENTRICLE TAPSE (M-mode): 2.3 cm LEFT ATRIUM             Index        RIGHT ATRIUM           Index LA Vol (A2C):   47.3 ml 23.94 ml/m  RA Area:     23.10 cm LA Vol (A4C):   86.4 ml 43.74 ml/m  RA Volume:   73.50 ml  37.21 ml/m LA Biplane Vol: 67.2 ml 34.02 ml/m  AORTIC VALVE LVOT Vmax:   153.00 cm/s LVOT Vmean:  99.000 cm/s LVOT VTI:    0.354 m  AORTA Ao Root diam: 3.40 cm Ao Asc diam:  3.30 cm TRICUSPID VALVE TR Peak grad:   23.4 mmHg TR Vmax:        242.00 cm/s  SHUNTS Systemic VTI:  0.35 m Systemic Diam: 2.80 cm Vernell Leep MD Electronically signed by Vernell Leep MD Signature Date/Time: 10/17/2021/7:36:08 AM    Final    CARDIAC CATHETERIZATION  Result Date: 10/16/2021 Images from the original result were not included.   RPAV lesion is 100% stenosed.   1st Mrg lesion is 100% stenosed.   Ost Cx lesion is 50% stenosed.   Prox LAD lesion is 40% stenosed.   LV end  diastolic pressure is mildly elevated. LM Normal LAD: Prox 40% disease LCx: Ostial 50% stenosis        PM1 prox occlusion, possibly subacute     RCA: RPLB flush occlusion (chronic)  Attempted but unsuccessful percutaneous coronary intervention OM1 Plan to perform PPM on 6/15. [name], MD Pager: [phone] Office: [phone]  DG Chest Port 1 View  Result Date: 10/16/2021 CLINICAL DATA:  Chest pain. EXAM: PORTABLE CHEST 1 VIEW COMPARISON:  Earlier radiograph dated 10/16/2020. FINDINGS: Evaluation is limited due to positioning. Interval placement of a right IJ line with tip over the mediastinum at the level of the diaphragm.  Persistent elevation of the right hemidiaphragm. No focal consolidation, pleural effusion or pneumothorax. The cardiac silhouette is within limits. No acute osseous pathology. IMPRESSION: Interval placement of a right IJ line. No pneumothorax. Electronically Signed   By: Anner Crete M.D.   On: 10/16/2021 19:58   DG Chest Port 1 View  Result Date: 10/16/2021 CLINICAL DATA:  Sweating, central chest pain and tightness EXAM: PORTABLE CHEST 1 VIEW COMPARISON:  None Available. FINDINGS: Cardiomediastinal silhouette is within normal limits in size. Elevated right hemidiaphragm with adjacent basilar atelectasis. Left lung is clear. No large effusion. No pneumothorax. No acute osseous abnormality. IMPRESSION: Elevated right hemidiaphragm with adjacent atelectasis. Electronically Signed   By: Maurine Simmering M.D.   On: 10/16/2021 12:38    EKG:on arrival shows second degree AV block, with grouped beating but minimal difference in pr intervals before and after a dropped beat, HR 45 bpm (personally reviewed)  TELEMETRY: intermittent NSR and 2nd degree AV block 40-60s, intermittent V pacing at VVI 40 (personally reviewed)  Assessment/Plan: 1.  Symptomatic second degree AV block No options for PCI, so no reversible cause. Lesion noted yesterday unlikely to be causative.   Explained risks, benefits, and alternatives to PPM implantation, including but not limited to bleeding, infection, pneumothorax, pericardial effusion, lead dislodgement, heart attack, stroke, or death.  Pt verbalized understanding and agrees to proceed   2. CAD Cath 6/14 with Attempted but unsuccessful percutaneous coronary intervention OM1   RPAV lesion is 100% stenosed.   1st Mrg lesion is 100% stenosed.   Ost Cx lesion is 50% stenosed.   Prox LAD lesion is 40% stenosed.   LV end diastolic pressure is mildly elevated.  LM Normal LAD: Prox 40% disease LCx: Ostial 50% stenosis        PM1 prox occlusion, possibly subacute      RCA: RPLB flush occlusion (chronic) Will need anti anginal therapy and uptitration of BB EF 60-65%   Dr. Lovena Le has seen and we will plan for pacemaker this afternoon.   For questions or updates, please contact [place] Please consult [email or web address] for contact info under Cardiology/STEMI.  Jacalyn Lefevre, PA-C  10/17/2021 8:00 AM  EP Attending  Patient seen and examined. Agree with above. The patient presents with symptomatic mobitz 2, second degree AV block in the setting of LBBB. He has not had syncope. His left heart cath is noted above. He is pain free. He is s/p temp PM insertion. His exam demonstrates a pleasant 81 yo man NAD with clear lungs and an IReg rhythm. Ext are warm and tele demonstrates NSR with intermittent ventricular pacing.  A/P High grade AV block with LBBB and 2nd degree mobitz 2, AV block - I have discussed the indications/risks/benefits/goals/expectations of PPM insertion with the patient and he is willing to proceed. CAD - as above. Note unsuccessful PCI of OM1. He is currently pain free. Once his PPM is in place we will plan to restart his beta blocker.  [name] Naylene Foell,MD

## 2021-10-17 NOTE — Progress Notes (Signed)
Subjective:  No complaints overnight  Objective:  Vital Signs in the last 24 hours: Temp:  [97.7 F (36.5 C)-98 F (36.7 C)] 97.7 F (36.5 C) (06/15 0300) Pulse Rate:  [0-78] 54 (06/15 0600) Resp:  [12-27] 12 (06/15 0600) BP: (108-161)/(50-133) 116/58 (06/15 0600) SpO2:  [91 %-100 %] 95 % (06/15 0600) Weight:  [81.6 kg-81.7 kg] 81.7 kg (06/15 0500)  Intake/Output from previous day: 06/14 0701 - 06/15 0700 In: 489.8 [I.V.:489.8] Out: 900 [Urine:900]  Physical Exam Vitals and nursing note reviewed.  Constitutional:      General: He is not in acute distress. Neck:     Vascular: No JVD.  Cardiovascular:     Rate and Rhythm: Normal rate and regular rhythm.     Heart sounds: Normal heart sounds. No murmur heard.    Comments: Rt IJ temp pacer in place Pulmonary:     Effort: Pulmonary effort is normal.     Breath sounds: Normal breath sounds. No wheezing or rales.  Musculoskeletal:     Right lower leg: No edema.     Left lower leg: No edema.     Imaging/tests reviewed and independently interpreted:  Cardiac Studies:  Coronary angiogram, attempted PCI, temp pacemaker placement 10/16/2021: LM Normal LAD: Prox 40% disease LCx: Ostial 50% stenosis        PM1 prox occlusion, possibly subacute      RCA: RPLB flush occlusion (chronic)   Attempted but unsuccessful percutaneous coronary intervention OM1  Temporary pacemaker placement Plan to perform PPM on 6/15.  Telemetry 10/17/2021: Appropriate pacing overnight  EKG 10/17/2021: Suspect arm lead reversal, interpretation assumes no reversal Sinus rhythm with 2nd degree A-V block (Mobitz II) Non-specific intra-ventricular conduction block Minimal voltage criteria for LVH, may be normal variant ( [employer] product )  Echocardiogram 10/16/2021: Left ventricular ejection fraction, by estimation, is 50 to 55%. The left ventricle has low normal function. The left ventricle demonstrates regional wall motion abnormalities (see  scoring diagram/findings for description). Left ventricular diastolic parameters are indeterminate. Elevated left atrial pressure. There is mild hypokinesis of the left ventricular, basal inferolateral wall.Right ventricular systolic function is low normal. The right ventricular size is not well visualized.The mitral valve is grossly normal. Moderate mitral valve regurgitation.Tricuspid valve regurgitation is moderate. The aortic valve is normal in structure. Aortic valve regurgitation is mild. No aortic stenosis is present. No significant change compared to previous outpatient study on 08/01/2020.   Assessment & Recommendations:  81 y.o. Caucasian male with hypertension, CAD (PCI in 2010), mild aortic ectasia, admitted with chest pain X1 hour this morning, followed by lightheadedness, intermittent complete AV block  High grade AV block: Temp pacer placed 6/14 Permanent pacemaker today  Occluded RPLB and OM1, likely chronic Conduction unlikely to improve even after revascualarization  CAD: Recommend Aspirin, statin. Can start beta blocker after PPM placement    [name], MD Pager: [phone] Office: [phone]

## 2021-10-17 NOTE — Consult Note (Addendum)
ELECTROPHYSIOLOGY CONSULT NOTE    Patient ID: Phillip Allen MRN: [id number], DOB/AGE: Aug 01, 1940 81 y.o.  Admit date: 10/16/2021 Date of Consult: 10/17/2021  Primary Physician: Fanny Bien, MD Primary Cardiologist: None  Electrophysiologist:  New to Dr. Lovena Le  Referring Provider: Dr. Virgina Jock  Patient Profile: Phillip Allen is a 81 y.o. male with a history of HTN, HLD, CAD, former smoker, and LBBB  who is being seen today for the evaluation of symptomatic bradycardia at the request of Dr. Terri Skains.  HPI:  Phillip Allen is a 81 y.o. male with medical history as above.  Patient was in his usual health 6/13 playing pickle ball but woke up am of 6/14 at 1 AM out of his sleep with substernal/epigastric chest pain, intensity 8 out of 10, pressure/squeeze like sensation, lasted for about 30 minutes, associated symptoms included nausea and left arm pain.  He has not had exertional chest pain.  His symptoms resolved.  He was due for blood work at his PCPs office and mentioned the episode this morning.  Patient states that an EKG was performed and referred to the ED for further evaluation and management.   Patient presented to [place] ED was found to be in Mobitz type II AV block and transferred to [facility] for further evaluation and management.  Currently denies anginal chest pain or heart failure symptoms.  He has some residual lower extremity swelling.  Denies near-syncope or syncopal events.  But does experience lightheaded and dizziness with ambulation.    Had a left heart catheterization in 2010 and was noted to have RCA disease which was not intervened on results noted below for reference.  Underwent cath by Dr. Virgina Jock which showed:   RPAV lesion is 100% stenosed.   1st Mrg lesion is 100% stenosed.   Ost Cx lesion is 50% stenosed.   Prox LAD lesion is 40% stenosed.   LV end diastolic pressure is mildly elevated.   LM Normal LAD: Prox 40% disease LCx:  Ostial 50% stenosis        PM1 prox occlusion, possibly subacute      RCA: RPLB flush occlusion (chronic)   Attempted but unsuccessful percutaneous coronary intervention OM1   With no options for intervention, EP asked to see for PPM consideration  Overnight, continues to have intermittent second degree AV block with intermittent V pacing.   He is feeling OK at rest. Recounts history above and states he has felt overall more fatigue for the past several months. No frank syncope.   Past Medical History:  Diagnosis Date   Depression    Hyperlipidemia      Surgical History:  Past Surgical History:  Procedure Laterality Date   CORONARY STENT INTERVENTION N/A 10/16/2021   Procedure: CORONARY STENT INTERVENTION;  Surgeon: [name], MD;  Location: [facility];  Service: Cardiovascular;  Laterality: N/A;   LEFT HEART CATH AND CORONARY ANGIOGRAPHY N/A 10/16/2021   Procedure: LEFT HEART CATH AND CORONARY ANGIOGRAPHY;  Surgeon: [name], MD;  Location: [facility];  Service: Cardiovascular;  Laterality: N/A;   SHOULDER SURGERY Right 06/2017   TEMPORARY PACEMAKER N/A 10/16/2021   Procedure: TEMPORARY PACEMAKER;  Surgeon: [name], MD;  Location: [facility];  Service: Cardiovascular;  Laterality: N/A;     Medications Prior to Admission  Medication Sig Dispense Refill Last Dose   acetaminophen (TYLENOL) 650 MG CR tablet Take 650 mg by mouth every 12 (twelve) hours. & up to  3 times daily as needed   10/15/2021   amLODipine (NORVASC) 5 MG tablet Take 1 tablet (5 mg total) by mouth daily. 90 tablet 3 10/15/2021   diclofenac Sodium (VOLTAREN) 1 % GEL Apply 1 application topically 4 (four) times daily.   10/15/2021   docusate sodium (COLACE) 100 MG capsule Take 100 mg by mouth 2 (two) times daily.   10/15/2021   Glucosamine-Chondroitin 250-200 MG TABS Take 1 tablet by mouth 2 (two) times daily.   10/15/2021   Krill Oil 500 MG CAPS Take by mouth  daily.   10/15/2021   losartan (COZAAR) 50 MG tablet TAKE 1 TABLET (50 MG TOTAL) BY MOUTH DAILY. 90 tablet 1 10/15/2021   meloxicam (MOBIC) 7.5 MG tablet Take 7.5 mg by mouth 2 (two) times daily.   10/15/2021   Multiple Vitamins-Minerals (MULTIVITAMIN MEN 50+ PO) Take by mouth daily.   10/15/2021   rosuvastatin (CRESTOR) 40 MG tablet Take 40 mg by mouth daily.   10/15/2021   vortioxetine HBr (TRINTELLIX) 20 MG TABS tablet Take 20 mg by mouth daily.   10/15/2021   nitroGLYCERIN (NITROSTAT) 0.4 MG SL tablet Place 1 tablet (0.4 mg total) under the tongue every 5 (five) minutes as needed for chest pain. 30 tablet 3     Inpatient Medications:   acetaminophen  650 mg Oral Q12H   Chlorhexidine Gluconate Cloth  6 each Topical Q0600   docusate sodium  100 mg Oral BID   meloxicam  7.5 mg Oral BID   rosuvastatin  40 mg Oral Daily   sodium chloride flush  3 mL Intravenous Q12H   vortioxetine HBr  20 mg Oral Daily    Allergies:  Allergies  Allergen Reactions   Penicillins Hives    Social History   Socioeconomic History   Marital status: Widowed    Spouse name: Not on file   Number of children: 2   Years of education: Not on file   Highest education level: Not on file  Occupational History   Not on file  Tobacco Use   Smoking status: Former    Packs/day: 0.25    Years: 10.00    Total pack years: 2.50    Types: Cigarettes    Quit date: 49    Years since quitting: 27.4   Smokeless tobacco: Never   Tobacco comments:    on/off since age 23  Vaping Use   Vaping Use: Never used  Substance and Sexual Activity   Alcohol use: Not Currently    Comment: has not drank any alcohol since Oct 2021   Drug use: Not on file   Sexual activity: Not on file  Other Topics Concern   Not on file  Social History Narrative   Not on file   Social Determinants of Health   Financial Resource Strain: Not on file  Food Insecurity: Not on file  Transportation Needs: Not on file  Physical Activity: Not  on file  Stress: Not on file  Social Connections: Not on file  Intimate Partner Violence: Not on file     Family History  Problem Relation Age of Onset   Breast cancer Mother    Stomach cancer Mother    Lung cancer Father    Heart attack Brother      Review of Systems: All other systems reviewed and are otherwise negative except as noted above.  Physical Exam: Vitals:   10/17/21 0300 10/17/21 0400 10/17/21 0500 10/17/21 0600  BP: 117/62 122/64 (!) 116/58 Marland Kitchen)  116/58  Pulse: (!) 49 (!) 51 (!) 50 (!) 54  Resp: 14 (!) '27 14 12  '$ Temp: 97.7 F (36.5 C)     TempSrc: Oral     SpO2: 96% 94% 93% 95%  Weight:   81.7 kg   Height:        GEN- The patient is well appearing, alert and oriented x 3 today.   HEENT: normocephalic, atraumatic; sclera clear, conjunctiva pink; hearing intact; oropharynx clear; neck supple Lungs- Clear to ausculation bilaterally, normal work of breathing.  No wheezes, rales, rhonchi Heart- Regular rate and rhythm, no murmurs, rubs or gallops GI- soft, non-tender, non-distended, bowel sounds present Extremities- no clubbing, cyanosis, or edema; DP/PT/radial pulses 2+ bilaterally MS- no significant deformity or atrophy Skin- warm and dry, no rash or lesion Psych- euthymic mood, full affect Neuro- strength and sensation are intact  Labs:   Lab Results  Component Value Date   WBC 8.0 10/17/2021   HGB 13.1 10/17/2021   HCT 39.1 10/17/2021   MCV 94.0 10/17/2021   PLT 221 10/17/2021    Recent Labs  Lab 10/17/21 0204  NA 139  K 3.8  CL 107  CO2 22  BUN 14  CREATININE 0.64  CALCIUM 8.5*  GLUCOSE 97      Radiology/Studies: ECHOCARDIOGRAM COMPLETE  Result Date: 10/17/2021    ECHOCARDIOGRAM REPORT   Patient Name:   TOSHIYUKI FREDELL Date of Exam: 10/16/2021 Medical Rec #:  [id number]     Height:       69.0 in Accession #:    [id number]    Weight:       180.0 lb Date of Birth:  09/05/1940     BSA:          1.976 m Patient Age:    5 years      BP:            142/55 mmHg Patient Gender: M             HR:           35 bpm. Exam Location:  Inpatient Procedure: 2D Echo, Cardiac Doppler and Color Doppler Indications:    Chest pain  History:        Patient has prior history of Echocardiogram examinations.                 Signs/Symptoms:Chest Pain.  Sonographer:    Danne Baxter RDCS, FE, PE Referring Phys: [id number] [place]  1. Left ventricular ejection fraction, by estimation, is 60 to 65%. The left ventricle has normal function. The left ventricle has no regional wall motion abnormalities. Left ventricular diastolic function could not be evaluated.  2. Right ventricular systolic function is normal. The right ventricular size is normal.  3. Left atrial size was mildly dilated.  4. Right atrial size was mildly dilated.  5. The mitral valve is normal in structure. Mild mitral valve regurgitation. No evidence of mitral stenosis.  6. Tricuspid valve regurgitation is moderate.  7. Inadequate Doppler assessment of aortic valve, unlikely to have signifciant stenosis.. The aortic valve is tricuspid. There is mild calcification of the aortic valve. Aortic valve regurgitation is mild. Aortic valve sclerosis is present, with no evidence of aortic valve stenosis. Comparison(s): No significant change compared to previous study on 08/01/2020. FINDINGS  Left Ventricle: Left ventricular ejection fraction, by estimation, is 60 to 65%. The left ventricle has normal function. The left ventricle has no regional wall motion abnormalities. The left ventricular  internal cavity size was normal in size. There is  no left ventricular hypertrophy. Left ventricular diastolic function could not be evaluated. Right Ventricle: The right ventricular size is normal. No increase in right ventricular wall thickness. Right ventricular systolic function is normal. Left Atrium: Left atrial size was mildly dilated. Right Atrium: Right atrial size was mildly dilated. Pericardium: There is no  evidence of pericardial effusion. Mitral Valve: The mitral valve is normal in structure. Mild mitral valve regurgitation. No evidence of mitral valve stenosis. Tricuspid Valve: The tricuspid valve is normal in structure. Tricuspid valve regurgitation is moderate . No evidence of tricuspid stenosis. Aortic Valve: Inadequate Doppler assessment of aortic valve, unlikely to have signifciant stenosis. The aortic valve is tricuspid. There is mild calcification of the aortic valve. Aortic valve regurgitation is mild. Aortic valve sclerosis is present, with no evidence of aortic valve stenosis. Pulmonic Valve: The pulmonic valve was normal in structure. Pulmonic valve regurgitation is not visualized. No evidence of pulmonic stenosis. Aorta: The aortic root is normal in size and structure. IAS/Shunts: The interatrial septum was not assessed.  LEFT VENTRICLE PLAX 2D LVIDd:         4.10 cm LVIDs:         2.70 cm LV PW:         1.00 cm LV IVS:        0.80 cm LVOT diam:     2.80 cm LV SV:         218 LV SV Index:   110 LVOT Area:     6.16 cm  RIGHT VENTRICLE TAPSE (M-mode): 2.3 cm LEFT ATRIUM             Index        RIGHT ATRIUM           Index LA Vol (A2C):   47.3 ml 23.94 ml/m  RA Area:     23.10 cm LA Vol (A4C):   86.4 ml 43.74 ml/m  RA Volume:   73.50 ml  37.21 ml/m LA Biplane Vol: 67.2 ml 34.02 ml/m  AORTIC VALVE LVOT Vmax:   153.00 cm/s LVOT Vmean:  99.000 cm/s LVOT VTI:    0.354 m  AORTA Ao Root diam: 3.40 cm Ao Asc diam:  3.30 cm TRICUSPID VALVE TR Peak grad:   23.4 mmHg TR Vmax:        242.00 cm/s  SHUNTS Systemic VTI:  0.35 m Systemic Diam: 2.80 cm Vernell Leep MD Electronically signed by Vernell Leep MD Signature Date/Time: 10/17/2021/7:36:08 AM    Final    CARDIAC CATHETERIZATION  Result Date: 10/16/2021 Images from the original result were not included.   RPAV lesion is 100% stenosed.   1st Mrg lesion is 100% stenosed.   Ost Cx lesion is 50% stenosed.   Prox LAD lesion is 40% stenosed.   LV end  diastolic pressure is mildly elevated. LM Normal LAD: Prox 40% disease LCx: Ostial 50% stenosis        PM1 prox occlusion, possibly subacute     RCA: RPLB flush occlusion (chronic)  Attempted but unsuccessful percutaneous coronary intervention OM1 Plan to perform PPM on 6/15. [name], MD Pager: [phone] Office: [phone]  DG Chest Port 1 View  Result Date: 10/16/2021 CLINICAL DATA:  Chest pain. EXAM: PORTABLE CHEST 1 VIEW COMPARISON:  Earlier radiograph dated 10/16/2020. FINDINGS: Evaluation is limited due to positioning. Interval placement of a right IJ line with tip over the mediastinum at the level of the diaphragm.  Persistent elevation of the right hemidiaphragm. No focal consolidation, pleural effusion or pneumothorax. The cardiac silhouette is within limits. No acute osseous pathology. IMPRESSION: Interval placement of a right IJ line. No pneumothorax. Electronically Signed   By: Anner Crete M.D.   On: 10/16/2021 19:58   DG Chest Port 1 View  Result Date: 10/16/2021 CLINICAL DATA:  Sweating, central chest pain and tightness EXAM: PORTABLE CHEST 1 VIEW COMPARISON:  None Available. FINDINGS: Cardiomediastinal silhouette is within normal limits in size. Elevated right hemidiaphragm with adjacent basilar atelectasis. Left lung is clear. No large effusion. No pneumothorax. No acute osseous abnormality. IMPRESSION: Elevated right hemidiaphragm with adjacent atelectasis. Electronically Signed   By: Maurine Simmering M.D.   On: 10/16/2021 12:38    EKG:on arrival shows second degree AV block, with grouped beating but minimal difference in pr intervals before and after a dropped beat, HR 45 bpm (personally reviewed)  TELEMETRY: intermittent NSR and 2nd degree AV block 40-60s, intermittent V pacing at VVI 40 (personally reviewed)  Assessment/Plan: 1.  Symptomatic second degree AV block No options for PCI, so no reversible cause. Lesion noted yesterday unlikely to be causative.   Explained risks, benefits, and alternatives to PPM implantation, including but not limited to bleeding, infection, pneumothorax, pericardial effusion, lead dislodgement, heart attack, stroke, or death.  Pt verbalized understanding and agrees to proceed   2. CAD Cath 6/14 with Attempted but unsuccessful percutaneous coronary intervention OM1   RPAV lesion is 100% stenosed.   1st Mrg lesion is 100% stenosed.   Ost Cx lesion is 50% stenosed.   Prox LAD lesion is 40% stenosed.   LV end diastolic pressure is mildly elevated.  LM Normal LAD: Prox 40% disease LCx: Ostial 50% stenosis        PM1 prox occlusion, possibly subacute      RCA: RPLB flush occlusion (chronic) Will need anti anginal therapy and uptitration of BB EF 60-65%   Dr. Lovena Le has seen and we will plan for pacemaker this afternoon.   For questions or updates, please contact [place] Please consult [email or web address] for contact info under Cardiology/STEMI.  Jacalyn Lefevre, PA-C  10/17/2021 8:00 AM  EP Attending  Patient seen and examined. Agree with above. The patient presents with symptomatic mobitz 2, second degree AV block in the setting of LBBB. He has not had syncope. His left heart cath is noted above. He is pain free. He is s/p temp PM insertion. His exam demonstrates a pleasant 81 yo man NAD with clear lungs and an IReg rhythm. Ext are warm and tele demonstrates NSR with intermittent ventricular pacing.  A/P High grade AV block with LBBB and 2nd degree mobitz 2, AV block - I have discussed the indications/risks/benefits/goals/expectations of PPM insertion with the patient and he is willing to proceed. CAD - as above. Note unsuccessful PCI of OM1. He is currently pain free. Once his PPM is in place we will plan to restart his beta blocker.  [name] Montrae Braithwaite,MD

## 2021-10-17 NOTE — Progress Notes (Signed)
  Transition of Care [facility]) Screening Note   Patient Details  Name: Phillip Allen Date of Birth: 09-21-1940   [facility] CM/SW Contact:    Milas Gain, [place] Phone Number: 10/17/2021, 4:46 PM    Transition of Care Department [facility]) has reviewed patient and no TOC needs have been identified at this time. We will continue to monitor patient advancement through interdisciplinary progression rounds. If new patient transition needs arise, please place a TOC consult.

## 2021-10-17 NOTE — Progress Notes (Addendum)
Electrophysiology Rounding Note  Patient Name: Phillip Allen Date of Encounter: 10/18/2021   Primary Cardiologist: Dr. Virgina Jock Electrophysiologist: New to Dr. Lovena Le   Subjective   The patient is doing well today.  At this time, the patient denies chest pain, shortness of breath, or any new concerns.  Inpatient Medications    Scheduled Meds:  acetaminophen  650 mg Oral Q12H   Chlorhexidine Gluconate Cloth  6 each Topical Q0600   docusate sodium  100 mg Oral BID   meloxicam  7.5 mg Oral BID   metoprolol succinate  25 mg Oral QHS   rosuvastatin  40 mg Oral Daily   sodium chloride flush  3 mL Intravenous Q12H   vortioxetine HBr  20 mg Oral Daily   Continuous Infusions:  sodium chloride     DOPamine     PRN Meds: sodium chloride, acetaminophen, acetaminophen, docusate sodium, ondansetron (ZOFRAN) IV, ondansetron (ZOFRAN) IV, mouth rinse, polyethylene glycol, sodium chloride flush   Vital Signs    Vitals:   10/18/21 0400 10/18/21 0500 10/18/21 0600 10/18/21 0700  BP: (!) 125/58 129/67 135/67 122/73  Pulse: 61 62 61 60  Resp: '15 14 15 18  '$ Temp:    97.7 F (36.5 C)  TempSrc:    Oral  SpO2: 94% 94% 93% 93%  Weight:  82.9 kg    Height:        Intake/Output Summary (Last 24 hours) at 10/18/2021 0915 Last data filed at 10/18/2021 0300 Gross per 24 hour  Intake 323 ml  Output 1675 ml  Net -1352 ml   Filed Weights   10/16/21 1207 10/17/21 0500 10/18/21 0500  Weight: 81.6 kg 81.7 kg 82.9 kg    Physical Exam    GEN- The patient is well appearing, alert and oriented x 3 today.   Head- normocephalic, atraumatic Eyes-  Sclera clear, conjunctiva pink Ears- hearing intact Oropharynx- clear Neck- supple Lungs- Clear to ausculation bilaterally, normal work of breathing Heart- Regular rate and rhythm, no murmurs, rubs or gallops GI- soft, NT, ND, + BS Extremities- no clubbing or cyanosis. No edema Skin- no rash or lesion Psych- euthymic mood, full affect Neuro-  strength and sensation are intact  Labs    CBC Recent Labs    10/16/21 1217 10/17/21 0204  WBC 8.5 8.0  HGB 13.9 13.1  HCT 43.2 39.1  MCV 95.6 94.0  PLT 259 174   Basic Metabolic Panel Recent Labs    10/16/21 1217 10/17/21 0204  NA 141 139  K 4.6 3.8  CL 106 107  CO2 27 22  GLUCOSE 115* 97  BUN 19 14  CREATININE 0.87 0.64  CALCIUM 9.8 8.5*  MG  --  2.0  PHOS  --  3.5   Liver Function Tests No results for input(s): "AST", "ALT", "ALKPHOS", "BILITOT", "PROT", "ALBUMIN" in the last 72 hours. No results for input(s): "LIPASE", "AMYLASE" in the last 72 hours. Cardiac Enzymes No results for input(s): "CKTOTAL", "CKMB", "CKMBINDEX", "TROPONINI" in the last 72 hours.   Telemetry    NSR 60 occasional V pacing (personally reviewed)  Radiology    DG Chest 2 View  Result Date: 10/18/2021 CLINICAL DATA:  Chest pain.  Pacemaker insertion. EXAM: CHEST - 2 VIEW COMPARISON:  AP chest 10/16/2021 (2 studies) FINDINGS: Interval removal of right internal jugular central venous catheter. Lung volumes are again moderately decreased. High-grade elevation of the right hemidiaphragm is unchanged. Associated right basilar atelectasis. New left chest wall cardiac pacer with leads overlying the  right atrium and right ventricle. Cardiac silhouette is again moderately to markedly enlarged. Mediastinal contours are grossly within normal limits. No acute skeletal abnormality. IMPRESSION: Interval removal of right internal jugular central venous catheter. Interval placement of left chest wall cardiac pacer with leads in appropriate position. Electronically Signed   By: Yvonne Kendall M.D.   On: 10/18/2021 08:29   EP PPM/ICD IMPLANT  Result Date: 10/17/2021 CONCLUSIONS:  1. 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 No early apparent complications.       Cristopher Peru, MD 10/17/2021 11:08 AM    CARDIAC  CATHETERIZATION  Addendum Date: 10/17/2021   LM Normal LAD: Prox 40% disease LCx: Ostial 50% stenosis        PM1 prox occlusion, possibly subacute     RCA: RPLB flush occlusion (chronic)  Attempted but unsuccessful percutaneous coronary intervention OM1 Temporary pacemaker placement Plan to perform PPM on 6/15. [name], MD Pager: [phone] Office: [phone]   Result Date: 10/17/2021 Images from the original result were not included.   RPAV lesion is 100% stenosed.   1st Mrg lesion is 100% stenosed.   Ost Cx lesion is 50% stenosed.   Prox LAD lesion is 40% stenosed.   LV end diastolic pressure is mildly elevated. LM Normal LAD: Prox 40% disease LCx: Ostial 50% stenosis        PM1 prox occlusion, possibly subacute     RCA: RPLB flush occlusion (chronic)  Attempted but unsuccessful percutaneous coronary intervention OM1 Plan to perform PPM on 6/15. [name], MD Pager: [phone] Office: [phone]  ECHOCARDIOGRAM COMPLETE  Result Date: 10/17/2021    ECHOCARDIOGRAM REPORT   Patient Name:   Phillip Allen Date of Exam: 10/16/2021 Medical Rec #:  [id number]     Height:       69.0 in Accession #:    [id number]    Weight:       180.0 lb Date of Birth:  [birth year]     BSA:          1.976 m Patient Age:    44 years      BP:           142/55 mmHg Patient Gender: M             HR:           35 bpm. Exam Location:  Inpatient Procedure: 2D Echo, Cardiac Doppler and Color Doppler Indications:    Chest pain  History:        Patient has prior history of Echocardiogram examinations.                 Signs/Symptoms:Chest Pain.  Sonographer:    Danne Baxter RDCS, FE, PE Referring Phys: [id number] [place]  1. Left ventricular ejection fraction, by estimation, is 60 to 65%. The left ventricle has normal function. The left ventricle has no regional wall motion abnormalities. Left ventricular diastolic function could not be evaluated.  2. Right ventricular systolic function is  normal. The right ventricular size is normal.  3. Left atrial size was mildly dilated.  4. Right atrial size was mildly dilated.  5. The mitral valve is normal in structure. Mild mitral valve regurgitation. No evidence of mitral stenosis.  6. Tricuspid valve regurgitation is moderate.  7. Inadequate Doppler assessment of aortic valve, unlikely to have signifciant stenosis.. The aortic valve is tricuspid. There is mild  calcification of the aortic valve. Aortic valve regurgitation is mild. Aortic valve sclerosis is present, with no evidence of aortic valve stenosis. Comparison(s): No significant change compared to previous study on 08/01/2020. FINDINGS  Left Ventricle: Left ventricular ejection fraction, by estimation, is 60 to 65%. The left ventricle has normal function. The left ventricle has no regional wall motion abnormalities. The left ventricular internal cavity size was normal in size. There is  no left ventricular hypertrophy. Left ventricular diastolic function could not be evaluated. Right Ventricle: The right ventricular size is normal. No increase in right ventricular wall thickness. Right ventricular systolic function is normal. Left Atrium: Left atrial size was mildly dilated. Right Atrium: Right atrial size was mildly dilated. Pericardium: There is no evidence of pericardial effusion. Mitral Valve: The mitral valve is normal in structure. Mild mitral valve regurgitation. No evidence of mitral valve stenosis. Tricuspid Valve: The tricuspid valve is normal in structure. Tricuspid valve regurgitation is moderate . No evidence of tricuspid stenosis. Aortic Valve: Inadequate Doppler assessment of aortic valve, unlikely to have signifciant stenosis. The aortic valve is tricuspid. There is mild calcification of the aortic valve. Aortic valve regurgitation is mild. Aortic valve sclerosis is present, with no evidence of aortic valve stenosis. Pulmonic Valve: The pulmonic valve was normal in structure. Pulmonic  valve regurgitation is not visualized. No evidence of pulmonic stenosis. Aorta: The aortic root is normal in size and structure. IAS/Shunts: The interatrial septum was not assessed.  LEFT VENTRICLE PLAX 2D LVIDd:         4.10 cm LVIDs:         2.70 cm LV PW:         1.00 cm LV IVS:        0.80 cm LVOT diam:     2.80 cm LV SV:         218 LV SV Index:   110 LVOT Area:     6.16 cm  RIGHT VENTRICLE TAPSE (M-mode): 2.3 cm LEFT ATRIUM             Index        RIGHT ATRIUM           Index LA Vol (A2C):   47.3 ml 23.94 ml/m  RA Area:     23.10 cm LA Vol (A4C):   86.4 ml 43.74 ml/m  RA Volume:   73.50 ml  37.21 ml/m LA Biplane Vol: 67.2 ml 34.02 ml/m  AORTIC VALVE LVOT Vmax:   153.00 cm/s LVOT Vmean:  99.000 cm/s LVOT VTI:    0.354 m  AORTA Ao Root diam: 3.40 cm Ao Asc diam:  3.30 cm TRICUSPID VALVE TR Peak grad:   23.4 mmHg TR Vmax:        242.00 cm/s  SHUNTS Systemic VTI:  0.35 m Systemic Diam: 2.80 cm Vernell Leep MD Electronically signed by Vernell Leep MD Signature Date/Time: 10/17/2021/7:36:08 AM    Final    DG Chest Port 1 View  Result Date: 10/16/2021 CLINICAL DATA:  Chest pain. EXAM: PORTABLE CHEST 1 VIEW COMPARISON:  Earlier radiograph dated 10/16/2020. FINDINGS: Evaluation is limited due to positioning. Interval placement of a right IJ line with tip over the mediastinum at the level of the diaphragm. Persistent elevation of the right hemidiaphragm. No focal consolidation, pleural effusion or pneumothorax. The cardiac silhouette is within limits. No acute osseous pathology. IMPRESSION: Interval placement of a right IJ line. No pneumothorax. Electronically Signed   By: Anner Crete M.D.   On:  10/16/2021 19:58   DG Chest Port 1 View  Result Date: 10/16/2021 CLINICAL DATA:  Sweating, central chest pain and tightness EXAM: PORTABLE CHEST 1 VIEW COMPARISON:  None Available. FINDINGS: Cardiomediastinal silhouette is within normal limits in size. Elevated right hemidiaphragm with adjacent  basilar atelectasis. Left lung is clear. No large effusion. No pneumothorax. No acute osseous abnormality. IMPRESSION: Elevated right hemidiaphragm with adjacent atelectasis. Electronically Signed   By: Maurine Simmering M.D.   On: 10/16/2021 12:38    Patient Profile     Phillip Allen is a 81 y.o. male with a history of HTN, HLD, CAD, former smoker, and LBBB  who is being seen today for the evaluation of symptomatic bradycardia at the request of Dr. Terri Skains.  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 Lovena Le Device interrogation stable this am CXR without acute finding Wound care and arm restrictions reviewed with patient.  Usual follow up arranged and placed in chart.   2. CAD With unsuccessful PCI of OM1.  Aggressive risk factor management and anti-anginal regiment now s/p pacer.  Denies pain.  BB started post pacer.   Dr. Lovena Le to see.   For questions or updates, please contact [place] Please consult [email or web address] for contact info under Cardiology/STEMI.  Signed, Shirley Friar, PA-C  10/18/2021, 9:15 AM   EP Attending  Patient seen and examined. Agree with the findings as noted above. The patient is doing well after PPM insertion. His interrogation under my direction demonstrates normal DDD PM function. He will be discharged home with usual followup. CXR demonstrates no PTX.  [name] Refugia Laneve,MD

## 2021-10-17 NOTE — Interval H&P Note (Signed)
History and Physical Interval Note:  10/17/2021 9:22 AM  Phillip Allen  has presented today for surgery, with the diagnosis of second degree av block.  The various methods of treatment have been discussed with the patient and family. After consideration of risks, benefits and other options for treatment, the patient has consented to  Procedure(s): PACEMAKER IMPLANT (N/A) as a surgical intervention.  The patient's history has been reviewed, patient examined, no change in status, stable for surgery.  I have reviewed the patient's chart and labs.  Questions were answered to the patient's satisfaction.     Phillip Allen

## 2021-10-17 NOTE — Discharge Instructions (Signed)
After Your Pacemaker   You have a Medtronic Pacemaker  ACTIVITY Do not lift your arm above shoulder height for 1 week after your procedure. After 7 days, you may progress as below.  You should remove your sling 24 hours after your procedure, unless otherwise instructed by your provider.     Thursday October 24, 2021  Friday October 25, 2021 Saturday October 26, 2021 Sunday October 27, 2021   Do not lift, push, pull, or carry anything over 10 pounds with the affected arm until 6 weeks (Thursday November 28, 2021 ) after your procedure.   You may drive AFTER your wound check, unless you have been told otherwise by your provider.   Ask your healthcare provider when you can go back to work   INCISION/Dressing If you are on a blood thinner such as Coumadin, Xarelto, Eliquis, Plavix, or Pradaxa please confirm with your provider when this should be resumed.   If large square, outer bandage is left in place, this can be removed after 24 hours from your procedure. Do not remove steri-strips or glue as below.   Monitor your Pacemaker site for redness, swelling, and drainage. Call the device clinic at [phone] if you experience these symptoms or fever/chills.  If your incision is sealed with Steri-strips or staples, you may shower 7 days after your procedure or when told by your provider. Do not remove the steri-strips or let the shower hit directly on your site. You may wash around your site with soap and water.    If you were discharged in a sling, please do not wear this during the day more than 48 hours after your surgery unless otherwise instructed. This may increase the risk of stiffness and soreness in your shoulder.   Avoid lotions, ointments, or perfumes over your incision until it is well-healed.  You may use a hot tub or a pool AFTER your wound check appointment if the incision is completely closed.  Pacemaker Alerts:  Some alerts are vibratory and others beep. These are NOT emergencies. Please  call our office to let us know. If this occurs at night or on weekends, it can wait until the next business day. Send a remote transmission.  If your device is capable of reading fluid status (for heart failure), you will be offered monthly monitoring to review this with you.   DEVICE MANAGEMENT Remote monitoring is used to monitor your pacemaker from home. This monitoring is scheduled every 91 days by our office. It allows [place] to keep an eye on the functioning of your device to ensure it is working properly. You will routinely see your Electrophysiologist annually (more often if necessary).   You should receive your ID card for your new device in 4-8 weeks. Keep this card with you at all times once received. Consider wearing a medical alert bracelet or necklace.  Your Pacemaker may be MRI compatible. This will be discussed at your next office visit/wound check.  You should avoid contact with strong electric or magnetic fields.   Do not use amateur (ham) radio equipment or electric (arc) welding torches. MP3 player headphones with magnets should not be used. Some devices are safe to use if held at least 12 inches (30 cm) from your Pacemaker. These include power tools, lawn mowers, and speakers. If you are unsure if something is safe to use, ask your health care provider.  When using your cell phone, hold it to the ear that is on the opposite side from the  Pacemaker. Do not leave your cell phone in a pocket over the Pacemaker.  You may safely use electric blankets, heating pads, computers, and microwave ovens.  Call the office right away if: You have chest pain. You feel more short of breath than you have felt before. You feel more light-headed than you have felt before. Your incision starts to open up.  This information is not intended to replace advice given to you by your health care provider. Make sure you discuss any questions you have with your health care provider.

## 2021-10-17 NOTE — Progress Notes (Addendum)
Pt. transferred to cath lab at 0930. E-link notified.   1120 - Pt. Returned from cath lab.

## 2021-10-18 ENCOUNTER — Inpatient Hospital Stay (HOSPITAL_COMMUNITY): Payer: MEDICARE

## 2021-10-18 ENCOUNTER — Other Ambulatory Visit (HOSPITAL_COMMUNITY): Payer: Self-pay

## 2021-10-18 ENCOUNTER — Telehealth: Payer: Self-pay

## 2021-10-18 MED ORDER — METOPROLOL SUCCINATE ER 25 MG PO TB24
25.0000 mg | ORAL_TABLET | Freq: Every day | ORAL | 3 refills | Status: DC
  Filled 2021-10-18: qty 30, 30d supply, fill #0

## 2021-10-18 MED ORDER — ASPIRIN 81 MG PO TBEC
81.0000 mg | DELAYED_RELEASE_TABLET | Freq: Every day | ORAL | 3 refills | Status: AC
  Filled 2021-10-18: qty 30, 30d supply, fill #0

## 2021-10-18 NOTE — Progress Notes (Signed)
CARDIAC REHAB PHASE I   PRE:  Rate/Rhythm: 60 NSR  BP:  Sitting: 117/60      SaO2: 93 RA  MODE:  Ambulation: 390 ft   POST:  Rate/Rhythm: 75 NSR  BP:  Sitting: 131/67       SaO2: 93 RA   Seen pt from [id number] pt was ambulated through hallways with minimal assistance. Pt walked well and w/o complaints. Pt was returned to chair educated on MI book, ex guidelines, NTG use, diet, and CRPII. Pt is being referred to CRPII in [place].    Christen Bame  8:55 AM 10/18/2021

## 2021-10-18 NOTE — Telephone Encounter (Signed)
Location of hospitalization: [place] Reason for hospitalization: Having symptoms of heart attack Date of discharge: 10/18/21 Date of first communication with patient: today Person contacting patient: Michail Sermon  Current symptoms: Patient feels much better Do you understand why you were in the Hospital: Yes Questions regarding discharge instructions: None Where were you discharged to: Home Medications reviewed: Yes Allergies reviewed: Yes Dietary changes reviewed: Yes. Discussed low fat and low salt diet.  Referals reviewed: NA Activities of Daily Living: Able to with mild limitations and not move his left arm too much.  Any transportation issues/concerns: None Any patient concerns: None Confirmed importance & date/time of Follow up appt: Yes Confirmed with patient if condition begins to worsen call. Pt was given the office number and encouraged to call back with questions or concerns: Yes

## 2021-10-18 NOTE — Plan of Care (Signed)
  Problem: Education: Goal: Knowledge of General Education information will improve Description: Including pain rating scale, medication(s)/side effects and non-pharmacologic comfort measures Outcome: Adequate for Discharge   Problem: Health Behavior/Discharge Planning: Goal: Ability to manage health-related needs will improve Outcome: Adequate for Discharge   Problem: Clinical Measurements: Goal: Ability to maintain clinical measurements within normal limits will improve Outcome: Adequate for Discharge Goal: Will remain free from infection Outcome: Adequate for Discharge Goal: Diagnostic test results will improve Outcome: Adequate for Discharge Goal: Cardiovascular complication will be avoided Outcome: Adequate for Discharge   Problem: Activity: Goal: Risk for activity intolerance will decrease Outcome: Adequate for Discharge

## 2021-10-18 NOTE — Discharge Summary (Signed)
Physician Discharge Summary  Patient ID: Phillip Allen MRN: [id number] DOB/AGE: 06/28/1940 81 y.o.  Admit date: 10/16/2021 Discharge date: 10/18/2021  Primary Discharge Diagnosis: High grade AV block  Secondary Discharge Diagnosis: Hypertension Coronary artery disease   Hospital Course:   81 y.o. Caucasian male with hypertension, CAD (PCI in 2010), mild aortic ectasia, admitted with chest pain X1 hour this morning, followed by lightheadedness, high grade AV block  Given patient's episode of chest pain and high-grade AV block, it was necessary to rule out unstable angina as cause of his conduction abnormality.  Coronary angiogram did show occluded RPL B, and occluded large OM1.  RPL B occlusion was known from prior cath in 2010, but OM occlusion was new since then.  I attempted wiring the OM vessel, but was unsuccessful.  However, overall chances of this being acute occlusion causing AV block were low, given normal troponins.  Therefore, I recommended medical management of coronary artery disease and placed temporary pacemaker through right IJ vein.  Patient subsequently had permanent pacemaker placed next day.  Rest of the hospital stay was unremarkable.  Transition care follow-up was arranged.  Aspirin 81 mg, and metoprolol succinate 25 mg daily were prescribed on discharge for management of coronary artery disease.  Discharge Exam: Blood pressure (!) 115/58, pulse (!) 56, temperature 97.7 F (36.5 C), temperature source Oral, resp. rate 17, height '5\' 9"'$  (1.753 m), weight 82.9 kg, SpO2 94 %.   Physical Exam Vitals and nursing note reviewed.  Constitutional:      General: He is not in acute distress. Neck:     Vascular: No JVD.  Cardiovascular:     Rate and Rhythm: Normal rate and regular rhythm.     Heart sounds: Normal heart sounds. No murmur heard. Pulmonary:     Effort: Pulmonary effort is normal.     Breath sounds: Normal breath sounds. No wheezing or rales.  Chest:      Comments: Pacemaker placed No hematoma Musculoskeletal:     Right lower leg: No edema.     Left lower leg: No edema.       Significant Diagnostic Studies:  EKG 10/18/2021: Atrial-sensed ventricular-paced rhythm  Pacemaker implantation 10/17/2021:  1. 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 No early apparent complications.   Echocardiogram 10/16/2021:  1. Left ventricular ejection fraction, by estimation, is 60 to 65%. The  left ventricle has normal function. The left ventricle has no regional  wall motion abnormalities. Left ventricular diastolic function could not  be evaluated.   2. Right ventricular systolic function is normal. The right ventricular  size is normal.   3. Left atrial size was mildly dilated.   4. Right atrial size was mildly dilated.   5. The mitral valve is normal in structure. Mild mitral valve  regurgitation. No evidence of mitral stenosis.   6. Tricuspid valve regurgitation is moderate.   7. Inadequate Doppler assessment of aortic valve, unlikely to have  signifciant stenosis.. The aortic valve is tricuspid. There is mild  calcification of the aortic valve. Aortic valve regurgitation is mild.  Aortic valve sclerosis is present, with no  evidence of aortic valve stenosis.   Coronary angiogram, temp pacemaker placement 10/16/2021: LM Normal LAD: Prox 40% disease LCx: Ostial 50% stenosis        PM1 prox occlusion, possibly subacute      RCA: RPLB flush occlusion (chronic)   Attempted but  unsuccessful percutaneous coronary intervention OM1   Temporary pacemaker placement Plan to perform PPM on 6/15.  EKG 10/17/2021: Sinus rhythm Second degree Mobitz 2 AV block LBBB   FOLLOW UP PLANS AND APPOINTMENTS Discharge Instructions     Amb Referral to Cardiac Rehabilitation   Complete by: As directed    Diagnosis: NSTEMI   After initial evaluation and  assessments completed: Virtual Based Care may be provided alone or in conjunction with Phase 2 Cardiac Rehab based on patient barriers.: Yes   Diet - low sodium heart healthy   Complete by: As directed    Discharge wound care:   Complete by: As directed    As per AVS   Increase activity slowly   Complete by: As directed       Allergies as of 10/18/2021       Reactions   Penicillins Hives        Medication List     STOP taking these medications    meloxicam 7.5 MG tablet Commonly known as: MOBIC       TAKE these medications    acetaminophen 650 MG CR tablet Commonly known as: TYLENOL Take 650 mg by mouth every 12 (twelve) hours. & up to 3 times daily as needed   amLODipine 5 MG tablet Commonly known as: NORVASC Take 1 tablet (5 mg total) by mouth daily.   Aspirin Low Dose 81 MG tablet Generic drug: aspirin EC Take 1 tablet (81 mg total) by mouth daily.   diclofenac Sodium 1 % Gel Commonly known as: VOLTAREN Apply 1 application topically 4 (four) times daily.   docusate sodium 100 MG capsule Commonly known as: COLACE Take 100 mg by mouth 2 (two) times daily.   Glucosamine-Chondroitin 250-200 MG Tabs Take 1 tablet by mouth 2 (two) times daily.   Krill Oil 500 MG Caps Take by mouth daily.   losartan 50 MG tablet Commonly known as: COZAAR TAKE 1 TABLET (50 MG TOTAL) BY MOUTH DAILY.   metoprolol succinate 25 MG 24 hr tablet Commonly known as: Toprol XL Take 1 tablet (25 mg total) by mouth daily.   MULTIVITAMIN MEN 50+ PO Take by mouth daily.   nitroGLYCERIN 0.4 MG SL tablet Commonly known as: NITROSTAT Place 1 tablet (0.4 mg total) under the tongue every 5 (five) minutes as needed for chest pain.   rosuvastatin 40 MG tablet Commonly known as: CRESTOR Take 40 mg by mouth daily.   Trintellix 20 MG Tabs tablet Generic drug: vortioxetine HBr Take 20 mg by mouth daily.               Discharge Care Instructions  (From admission, onward)            Start     Ordered   10/18/21 0000  Discharge wound care:       Comments: As per AVS   10/18/21 0740            Follow-up Information     [facility] Follow up.   Why: on 6/29 at 10 am for post pacemaker check Contact information: [place] [address] [phone]        [name], MD Follow up on 11/01/2021.   Specialties: Cardiology, Radiology Why: 9:30 AM Contact information: [place] [address] [phone]                   [name],  MD Pager: [phone] Office: [phone]

## 2021-10-19 LAB — LIPOPROTEIN A (LPA): Lipoprotein (a): 99.1 nmol/L — ABNORMAL HIGH (ref <75.0)

## 2021-10-23 DIAGNOSIS — I2510 Atherosclerotic heart disease of native coronary artery without angina pectoris: Secondary | ICD-10-CM | POA: Diagnosis not present

## 2021-10-23 DIAGNOSIS — I10 Essential (primary) hypertension: Secondary | ICD-10-CM | POA: Diagnosis not present

## 2021-10-23 DIAGNOSIS — F411 Generalized anxiety disorder: Secondary | ICD-10-CM | POA: Diagnosis not present

## 2021-10-23 DIAGNOSIS — F331 Major depressive disorder, recurrent, moderate: Secondary | ICD-10-CM | POA: Diagnosis not present

## 2021-10-23 DIAGNOSIS — E1165 Type 2 diabetes mellitus with hyperglycemia: Secondary | ICD-10-CM | POA: Diagnosis not present

## 2021-10-23 DIAGNOSIS — G4700 Insomnia, unspecified: Secondary | ICD-10-CM | POA: Diagnosis not present

## 2021-10-23 DIAGNOSIS — Z950 Presence of cardiac pacemaker: Secondary | ICD-10-CM | POA: Diagnosis not present

## 2021-10-23 DIAGNOSIS — I441 Atrioventricular block, second degree: Secondary | ICD-10-CM | POA: Diagnosis not present

## 2021-10-23 DIAGNOSIS — E782 Mixed hyperlipidemia: Secondary | ICD-10-CM | POA: Diagnosis not present

## 2021-10-31 ENCOUNTER — Ambulatory Visit: Payer: MEDICARE

## 2021-10-31 DIAGNOSIS — F4323 Adjustment disorder with mixed anxiety and depressed mood: Secondary | ICD-10-CM | POA: Diagnosis not present

## 2021-10-31 DIAGNOSIS — I441 Atrioventricular block, second degree: Secondary | ICD-10-CM

## 2021-10-31 LAB — CUP PACEART INCLINIC DEVICE CHECK
Battery Remaining Longevity: 145 mo
Battery Voltage: 3.22 V
Brady Statistic AP VP Percent: 10.69 %
Brady Statistic AP VS Percent: 0 %
Brady Statistic AS VP Percent: 89.29 %
Brady Statistic AS VS Percent: 0.02 %
Brady Statistic RA Percent Paced: 10.65 %
Brady Statistic RV Percent Paced: 99.98 %
Date Time Interrogation Session: 20230629100700
Implantable Lead Implant Date: 20230615
Implantable Lead Implant Date: 20230615
Implantable Lead Location: 753859
Implantable Lead Location: 753860
Implantable Lead Model: 3830
Implantable Lead Model: 5076
Implantable Pulse Generator Implant Date: 20230615
Lead Channel Impedance Value: 342 Ohm
Lead Channel Impedance Value: 361 Ohm
Lead Channel Impedance Value: 437 Ohm
Lead Channel Impedance Value: 532 Ohm
Lead Channel Pacing Threshold Amplitude: 0.5 V
Lead Channel Pacing Threshold Amplitude: 0.625 V
Lead Channel Pacing Threshold Pulse Width: 0.4 ms
Lead Channel Pacing Threshold Pulse Width: 0.4 ms
Lead Channel Sensing Intrinsic Amplitude: 2.75 mV
Lead Channel Sensing Intrinsic Amplitude: 2.875 mV
Lead Channel Setting Pacing Amplitude: 3.5 V
Lead Channel Setting Pacing Amplitude: 3.5 V
Lead Channel Setting Pacing Pulse Width: 0.4 ms
Lead Channel Setting Sensing Sensitivity: 2 mV

## 2021-10-31 NOTE — Patient Instructions (Signed)
   After Your Pacemaker   Monitor your pacemaker site for redness, swelling, and drainage. Call the device clinic at [phone] if you experience these symptoms or fever/chills.  Your incision was closed with Steri-strips or staples:  You may shower 7 days after your procedure and wash your incision with soap and water. Avoid lotions, ointments, or perfumes over your incision until it is well-healed.   Do not lift, push or pull greater than 10 pounds with the affected arm until November 28 2021. There are no other restrictions in arm movement after your wound check appointment.   Your Pacemaker is MRI compatible.  Remote monitoring is used to monitor your pacemaker from home. This monitoring is scheduled every 91 days by our office. It allows [place] to keep an eye on the functioning of your device to ensure it is working properly. You will routinely see your Electrophysiologist annually (more often if necessary).

## 2021-10-31 NOTE — Progress Notes (Signed)
Wound check appointment. Steri-strips removed. removed prior to OV.  Wound without redness or edema. Incision edges approximated, wound well healed. Normal device function. Thresholds, sensing, and impedances consistent with implant measurements. Device programmed at 3.5V/auto capture programmed on for extra safety margin until 3 month visit. Histogram distribution appropriate for patient and level of activity. No mode switches or high ventricular rates noted. Patient educated about wound care, arm mobility, lifting restrictions. ROV 01/22/2022 w/ GT

## 2021-11-01 ENCOUNTER — Encounter: Payer: Self-pay | Admitting: Cardiology

## 2021-11-01 ENCOUNTER — Ambulatory Visit: Payer: MEDICARE | Admitting: Cardiology

## 2021-11-01 VITALS — BP 105/62 | HR 55 | Temp 98.1°F | Resp 16 | Ht 69.0 in | Wt 176.0 lb

## 2021-11-01 DIAGNOSIS — I10 Essential (primary) hypertension: Secondary | ICD-10-CM

## 2021-11-01 DIAGNOSIS — I2510 Atherosclerotic heart disease of native coronary artery without angina pectoris: Secondary | ICD-10-CM

## 2021-11-01 DIAGNOSIS — I441 Atrioventricular block, second degree: Secondary | ICD-10-CM | POA: Diagnosis not present

## 2021-11-01 NOTE — Progress Notes (Signed)
Follow up visit  Subjective:   Phillip Allen, male    DOB: [birth year], 81 y.o.   MRN: [id number]   HPI  81 y.o. Caucasian male with hypertension, CAD, mild aortic ectasia, high grade AV block s/p PPM (10/2021)  Patient presented in 10/2021 with complaints of chest pain and lightheadedness.  Work-up showed CAD with likely chronic occlusions, but no acute unstable coronary lesion.  Temporary pacemaker was placed, followed by permanent pacemaker.  Patient has been doing very well since then.  He denies any chest pain, shortness of breath, lightheadedness symptoms.     Current Outpatient Medications:    acetaminophen (TYLENOL) 650 MG CR tablet, Take 650 mg by mouth every 12 (twelve) hours. & up to 3 times daily as needed, Disp: , Rfl:    amLODipine (NORVASC) 5 MG tablet, Take 1 tablet (5 mg total) by mouth daily., Disp: 90 tablet, Rfl: 3   aspirin EC 81 MG tablet, Take 1 tablet (81 mg total) by mouth daily., Disp: 30 tablet, Rfl: 3   diclofenac Sodium (VOLTAREN) 1 % GEL, Apply 1 application topically 4 (four) times daily., Disp: , Rfl:    docusate sodium (COLACE) 100 MG capsule, Take 100 mg by mouth 2 (two) times daily., Disp: , Rfl:    FARXIGA 5 MG TABS tablet, Take 5 mg by mouth every morning., Disp: , Rfl:    Glucosamine-Chondroitin 250-200 MG TABS, Take 1 tablet by mouth 2 (two) times daily., Disp: , Rfl:    Krill Oil 500 MG CAPS, Take by mouth daily., Disp: , Rfl:    meloxicam (MOBIC) 15 MG tablet, Take 7.5 mg by mouth 2 (two) times daily., Disp: , Rfl:    metoprolol succinate (TOPROL XL) 25 MG 24 hr tablet, Take 1 tablet (25 mg total) by mouth daily., Disp: 30 tablet, Rfl: 3   Multiple Vitamins-Minerals (MULTIVITAMIN MEN 50+ PO), Take by mouth daily., Disp: , Rfl:    rosuvastatin (CRESTOR) 40 MG tablet, Take 40 mg by mouth daily., Disp: , Rfl:    vortioxetine HBr (TRINTELLIX) 20 MG TABS tablet, Take 20 mg by mouth daily., Disp: , Rfl:    losartan (COZAAR) 50 MG tablet, TAKE 1 TABLET  (50 MG TOTAL) BY MOUTH DAILY., Disp: 90 tablet, Rfl: 1   nitroGLYCERIN (NITROSTAT) 0.4 MG SL tablet, Place 1 tablet (0.4 mg total) under the tongue every 5 (five) minutes as needed for chest pain., Disp: 30 tablet, Rfl: 3  Cardiovascular & other pertient studies:  EKG 11/01/2021: Sinus rhythm 55 bpm with ventricular pacing  Pacemaker implantation 10/17/2021 (Dr. Lovena Le): CONCLUSIONS:   1. 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 No early apparent complications.   Echocardiogram 10/16/2021:   1.Left ventricular ejection fraction, by estimation, is 60 to 65%. The  left ventricle has normal function. The left ventricle has no regional  wall motion abnormalities. Left ventricular diastolic function could not  be evaluated.   2. Right ventricular systolic function is normal. The right ventricular  size is normal.   3. Left atrial size was mildly dilated.   4. Right atrial size was mildly dilated.   5. The mitral valve is normal in structure. Mild mitral valve  regurgitation. No evidence of mitral stenosis.   6. Tricuspid valve regurgitation is moderate.   7. Inadequate Doppler assessment of aortic valve, unlikely to have  signifciant stenosis.. The aortic valve is tricuspid. There is mild  calcification of the aortic valve. Aortic valve regurgitation is mild.  Aortic valve sclerosis is present, with no  evidence of aortic valve stenosis.   Comparison(s): No significant change compared to previous study on  08/01/2020.   Coronary angiography 10/16/2021: LM Normal LAD: Prox 40% disease LCx: Ostial 50% stenosis        PM1 prox occlusion, possibly subacute      RCA: RPLB flush occlusion (chronic)   Attempted but unsuccessful percutaneous coronary intervention OM1   Temporary pacemaker placement Plan to perform PPM on 6/15.  Recent labs: 04/17/2020: Glucose 121, BUN/Cr 15/0.8. EGFR  86. HbA1C 6.3% Chol 133, TG 102, HDL 44, LDL 70 TSH 2.7 normal  07/11/2019: Glucose 158, BUN/Cr 17/0.8. EGFR 84 HbA1C 6.2%  01/25/2019: Glucose 122, BUN/Cr 10/0.7. EGFR 91. Na/K 139/5/0. Rest of the CMP normal H/H 14/42. MCV 96. Platelets 296 HbA1C 5.9% Chol 162, TG 139, HDL 61, LDL 77 TSH 2.3 normal    Review of Systems  Cardiovascular:  Negative for chest pain, dyspnea on exertion, leg swelling, palpitations and syncope.         Vitals:   11/01/21 0934  BP: 105/62  Pulse: (!) 55  Resp: 16  Temp: 98.1 F (36.7 C)  SpO2: 95%      Objective:   Physical Exam Vitals and nursing note reviewed.  Constitutional:      General: He is not in acute distress. Neck:     Vascular: No JVD.  Cardiovascular:     Rate and Rhythm: Normal rate and regular rhythm.     Heart sounds: Normal heart sounds. No murmur heard. Pulmonary:     Effort: Pulmonary effort is normal.     Breath sounds: Normal breath sounds. No wheezing or rales.  Chest:     Comments: Pacemaker scar well healed Musculoskeletal:     Right lower leg: No edema.     Left lower leg: No edema.       Assessment & Recommendations:   81 y.o. 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 Recommend medical management. Continue Aspirin, statin, metoprolol, amlodipine. Check lipid panel. Lipoprotein a is only minimally elevated.  High grade AV block: Now s/p PPM placement  Hypertension: Well controlled  Mixed hyperlipidemia: Continue Crestor 40 mg daily. Check lipid panel  F/u in 6 months  Cheralyn Oliver Esther Hardy, MD [facility] Pager: [phone] Office: [phone]

## 2021-11-14 DIAGNOSIS — F4323 Adjustment disorder with mixed anxiety and depressed mood: Secondary | ICD-10-CM | POA: Diagnosis not present

## 2021-11-26 DIAGNOSIS — I2510 Atherosclerotic heart disease of native coronary artery without angina pectoris: Secondary | ICD-10-CM | POA: Diagnosis not present

## 2021-11-26 DIAGNOSIS — I10 Essential (primary) hypertension: Secondary | ICD-10-CM | POA: Diagnosis not present

## 2021-11-28 DIAGNOSIS — F4323 Adjustment disorder with mixed anxiety and depressed mood: Secondary | ICD-10-CM | POA: Diagnosis not present

## 2021-12-10 ENCOUNTER — Telehealth (HOSPITAL_COMMUNITY): Payer: Self-pay

## 2021-12-10 NOTE — Telephone Encounter (Signed)
Pt insurance is active and benefits verified through Medicare a/b Co-pay 0, DED $226/$226 met, out of pocket 0/0 met, co-insurance 20%. no pre-authorization required. Passport, 12/10/2021_0 :27am, REF# [id number]   How many CR sessions are covered? (36 sessions for TCR, 72 sessions for ICR)72 Is this a lifetime maximum or an annual maximum? lifetime Has the member used any of these services to date? no Is there a time limit (weeks/months) on start of program and/or program completion? No   2ndary insurance is active and benefits verified through [employer]. Co-pay 0, DED 0/0 met, out of pocket 0/0 met, co-insurance 0. No pre-authorization required. Passport, 12/10/2021_1 :31am, REF# [id number]

## 2021-12-17 DIAGNOSIS — F4323 Adjustment disorder with mixed anxiety and depressed mood: Secondary | ICD-10-CM | POA: Diagnosis not present

## 2021-12-18 ENCOUNTER — Telehealth (HOSPITAL_COMMUNITY): Payer: Self-pay | Admitting: *Deleted

## 2021-12-18 DIAGNOSIS — M1230 Palindromic rheumatism, unspecified site: Secondary | ICD-10-CM | POA: Diagnosis not present

## 2021-12-18 DIAGNOSIS — E663 Overweight: Secondary | ICD-10-CM | POA: Diagnosis not present

## 2021-12-18 DIAGNOSIS — R768 Other specified abnormal immunological findings in serum: Secondary | ICD-10-CM | POA: Diagnosis not present

## 2021-12-18 DIAGNOSIS — Z6825 Body mass index (BMI) 25.0-25.9, adult: Secondary | ICD-10-CM | POA: Diagnosis not present

## 2021-12-18 DIAGNOSIS — R42 Dizziness and giddiness: Secondary | ICD-10-CM | POA: Diagnosis not present

## 2021-12-18 NOTE — Telephone Encounter (Signed)
Phillip Allen to confirm his CR orientation appointment. He states that he is coming.Completed his nurse assessment. We discussed Covid precautions, mask, proper shoes, directions to the department and our contact number. He voices understanding.

## 2021-12-19 ENCOUNTER — Encounter (HOSPITAL_COMMUNITY)
Admission: RE | Admit: 2021-12-19 | Discharge: 2021-12-19 | Disposition: A | Discharge status: Home / Self Care | Payer: MEDICARE | Source: Ambulatory Visit | Attending: Cardiology | Admitting: Cardiology

## 2021-12-19 VITALS — BP 107/58 | HR 56 | Ht 68.0 in | Wt 178.4 lb

## 2021-12-19 DIAGNOSIS — I214 Non-ST elevation (NSTEMI) myocardial infarction: Secondary | ICD-10-CM | POA: Diagnosis present

## 2021-12-19 DIAGNOSIS — I252 Old myocardial infarction: Secondary | ICD-10-CM | POA: Insufficient documentation

## 2021-12-19 NOTE — Progress Notes (Addendum)
Cardiac Individual Treatment Plan  Patient Details  Name: Phillip Allen MRN: [id number] Date of Birth: [birth year] Referring Provider:   Flowsheet Row INTENSIVE CARDIAC REHAB ORIENT from 12/19/2021 in [place]  Referring Provider Dr Vernell Leep, MD       Initial Encounter Date:  [place] from 12/19/2021 in [place]  Date 12/19/21       Visit Diagnosis: NSTEMI (non-ST elevated myocardial infarction) [facility])  Patient's Home Medications on Admission:  Current Outpatient Medications:    aspirin EC 81 MG tablet, Take 1 tablet (81 mg total) by mouth daily., Disp: 30 tablet, Rfl: 3   Collagen-Boron-Hyaluronic Acid (MOVE FREE ULTRA JOINT HEALTH PO), Take 1 tablet by mouth daily., Disp: , Rfl:    diclofenac Sodium (VOLTAREN) 1 % GEL, Apply 1 application  topically 4 (four) times daily as needed (pain)., Disp: , Rfl:    docusate sodium (COLACE) 100 MG capsule, Take 100 mg by mouth 2 (two) times daily., Disp: , Rfl:    FARXIGA 5 MG TABS tablet, Take 5 mg by mouth every morning., Disp: , Rfl:    Krill Oil 500 MG CAPS, Take 500 mg by mouth daily., Disp: , Rfl:    losartan (COZAAR) 50 MG tablet, TAKE 1 TABLET (50 MG TOTAL) BY MOUTH DAILY. (Patient taking differently: Take 25 mg by mouth daily.), Disp: 90 tablet, Rfl: 1   metoprolol succinate (TOPROL XL) 25 MG 24 hr tablet, Take 1 tablet (25 mg total) by mouth daily., Disp: 30 tablet, Rfl: 3   Multiple Vitamins-Minerals (MULTIVITAMIN MEN 50+ PO), Take 1 tablet by mouth daily., Disp: , Rfl:    nitroGLYCERIN (NITROSTAT) 0.4 MG SL tablet, Place 1 tablet (0.4 mg total) under the tongue every 5 (five) minutes as needed for chest pain., Disp: 30 tablet, Rfl: 3   Polyvinyl Alcohol-Povidone (REFRESH OP), Place 1 drop into both eyes daily as needed (dry eyes)., Disp: , Rfl:    rosuvastatin (CRESTOR) 40 MG tablet, Take 40 mg by mouth daily., Disp: , Rfl:     vortioxetine HBr (TRINTELLIX) 20 MG TABS tablet, Take 20 mg by mouth daily., Disp: , Rfl:    amLODipine (NORVASC) 5 MG tablet, Take 1 tablet (5 mg total) by mouth daily. (Patient not taking: Reported on 12/17/2021), Disp: 90 tablet, Rfl: 3  Past Medical History: Past Medical History:  Diagnosis Date   Depression    Hyperlipidemia     Tobacco Use: Social History   Tobacco Use  Smoking Status Former   Packs/day: 0.25   Years: 10.00   Total pack years: 2.50   Types: Cigarettes   Quit date: 1996   Years since quitting: 27.6  Smokeless Tobacco Never  Tobacco Comments   on/off since age 61    Labs: Review Flowsheet        No data to display          Capillary Blood Glucose: No results found for: "GLUCAP"   Exercise Target Goals: Exercise Program Goal: Individual exercise prescription set using results from initial 6 min walk test and THRR while considering  patient's activity barriers and safety.   Exercise Prescription Goal: Initial exercise prescription builds to 30-45 minutes a day of aerobic activity, 2-3 days per week.  Home exercise guidelines will be given to patient during program as part of exercise prescription that the participant will acknowledge.  Activity Barriers & Risk Stratification:  Activity Barriers & Cardiac Risk Stratification -  12/19/21 1321       Activity Barriers & Cardiac Risk Stratification   Activity Barriers Back Problems;Joint Problems    Cardiac Risk Stratification High             6 Minute Walk:  6 Minute Walk     Row Name 12/19/21 1319         6 Minute Walk   Phase Initial     Distance 1511 feet     Walk Time 6 minutes     # of Rest Breaks 0     MPH 2.86     METS 2.56     RPE 9     Perceived Dyspnea  0     VO2 Peak 8.94     Symptoms No     Resting HR 56 bpm     Resting BP 107/58     Resting Oxygen Saturation  97 %     Exercise Oxygen Saturation  during 6 min walk 98 %     Max Ex. HR 76 bpm     Max Ex. BP  126/58     2 Minute Post BP 122/62              Oxygen Initial Assessment:   Oxygen Re-Evaluation:   Oxygen Discharge (Final Oxygen Re-Evaluation):   Initial Exercise Prescription:  Initial Exercise Prescription - 12/19/21 1300       Date of Initial Exercise RX and Referring Provider   Date 12/19/21    Referring Provider Dr Vernell Leep, MD    Expected Discharge Date 02/21/22      Recumbant Bike   Level 2    RPM 60    Minutes 15    METs 2.3      NuStep   Level 2    SPM 80    Minutes 15    METs 2.3      Prescription Details   Frequency (times per week) 3    Duration Progress to 30 minutes of continuous aerobic without signs/symptoms of physical distress      Intensity   THRR 40-80% of Max Heartrate 56-113    Ratings of Perceived Exertion 11-13    Perceived Dyspnea 0-4      Progression   Progression Continue progressive overload as per policy without signs/symptoms or physical distress.      Resistance Training   Training Prescription Yes    Weight 3    Reps 10-15             Perform Capillary Blood Glucose checks as needed.  Exercise Prescription Changes:   Exercise Comments:   Exercise Goals and Review:   Exercise Goals     Row Name 12/19/21 1325             Exercise Goals   Increase Physical Activity Yes       Intervention Provide advice, education, support and counseling about physical activity/exercise needs.;Develop an individualized exercise prescription for aerobic and resistive training based on initial evaluation findings, risk stratification, comorbidities and participant's personal goals.       Expected Outcomes Short Term: Attend rehab on a regular basis to increase amount of physical activity.;Long Term: Add in home exercise to make exercise part of routine and to increase amount of physical activity.;Long Term: Exercising regularly at least 3-5 days a week.       Increase Strength and Stamina Yes       Intervention  Provide advice, education, support and counseling  about physical activity/exercise needs.;Develop an individualized exercise prescription for aerobic and resistive training based on initial evaluation findings, risk stratification, comorbidities and participant's personal goals.       Expected Outcomes Short Term: Increase workloads from initial exercise prescription for resistance, speed, and METs.;Short Term: Perform resistance training exercises routinely during rehab and add in resistance training at home;Long Term: Improve cardiorespiratory fitness, muscular endurance and strength as measured by increased METs and functional capacity (6MWT)       Able to understand and use rate of perceived exertion (RPE) scale Yes       Intervention Provide education and explanation on how to use RPE scale       Expected Outcomes Short Term: Able to use RPE daily in rehab to express subjective intensity level;Long Term:  Able to use RPE to guide intensity level when exercising independently       Knowledge and understanding of Target Heart Rate Range (THRR) Yes       Intervention Provide education and explanation of THRR including how the numbers were predicted and where they are located for reference       Expected Outcomes Short Term: Able to state/look up THRR;Long Term: Able to use THRR to govern intensity when exercising independently;Short Term: Able to use daily as guideline for intensity in rehab                Exercise Goals Re-Evaluation :   Discharge Exercise Prescription (Final Exercise Prescription Changes):   Nutrition:  Target Goals: Understanding of nutrition guidelines, daily intake of sodium '1500mg'$ , cholesterol '200mg'$ , calories 30% from fat and 7% or less from saturated fats, daily to have 5 or more servings of fruits and vegetables.  Biometrics:  Pre Biometrics - 12/19/21 1319       Pre Biometrics   Waist Circumference 39.75 inches    Hip Circumference 39 inches    Waist to  Hip Ratio 1.02 %    Triceps Skinfold 8 mm    % Body Fat 25.1 %    Grip Strength 38 kg    Flexibility --   Pt unable to reach   Single Leg Stand 30 seconds              Nutrition Therapy Plan and Nutrition Goals:   Nutrition Assessments:  MEDIFICTS Score Key: ?70 Need to make dietary changes  40-70 Heart Healthy Diet ? 40 Therapeutic Level Cholesterol Diet    Picture Your Plate Scores: <15 Unhealthy dietary pattern with much room for improvement. 41-50 Dietary pattern unlikely to meet recommendations for good health and room for improvement. 51-60 More healthful dietary pattern, with some room for improvement.  >60 Healthy dietary pattern, although there may be some specific behaviors that could be improved.    Nutrition Goals Re-Evaluation:   Nutrition Goals Re-Evaluation:   Nutrition Goals Discharge (Final Nutrition Goals Re-Evaluation):   Psychosocial: Target Goals: Acknowledge presence or absence of significant depression and/or stress, maximize coping skills, provide positive support system. Participant is able to verbalize types and ability to use techniques and skills needed for reducing stress and depression.  Initial Review & Psychosocial Screening:  Initial Psych Review & Screening - 12/19/21 1102       Initial Review   Current issues with Current Depression;History of Depression      Family Dynamics   Good Support System? Yes    Comments Yehonatan who has a history of and current depression is dealing with the recent loss of his older  brother.  Larrell feels supported by his girlfriend.  shantanu is currently on a anti-depressent medication -Trintellix is working well for him.  Sees couselor for several years every other week      Barriers   Psychosocial barriers to participate in program The patient should benefit from training in stress management and relaxation.      Screening Interventions   Interventions Encouraged to exercise;Provide feedback about  the scores to participant    Expected Outcomes Short Term goal: Identification and review with participant of any Quality of Life or Depression concerns found by scoring the questionnaire.;Long Term goal: The participant improves quality of Life and PHQ9 Scores as seen by post scores and/or verbalization of changes             Quality of Life Scores:  Quality of Life - 12/19/21 1327       Quality of Life   Select Quality of Life      Quality of Life Scores   Health/Function Pre 22.13 %    Socioeconomic Pre 25 %    Psych/Spiritual Pre 18.33 %    Family Pre 22.1 %    GLOBAL Pre 22.05 %            Scores of 19 and below usually indicate a poorer quality of life in these areas.  A difference of  2-3 points is a clinically meaningful difference.  A difference of 2-3 points in the total score of the Quality of Life Index has been associated with significant improvement in overall quality of life, self-image, physical symptoms, and general health in studies assessing change in quality of life.  PHQ-9: Review Flowsheet       12/19/2021  Depression screen PHQ 2/9  Decreased Interest 1  Down, Depressed, Hopeless 1  PHQ - 2 Score 2  Altered sleeping 1  Tired, decreased energy 2  Change in appetite 1  Feeling bad or failure about yourself  0  Trouble concentrating 0  Moving slowly or fidgety/restless 0  Suicidal thoughts 0  PHQ-9 Score 6  Difficult doing work/chores Not difficult at all   Interpretation of Total Score  Total Score Depression Severity:  1-4 = Minimal depression, 5-9 = Mild depression, 10-14 = Moderate depression, 15-19 = Moderately severe depression, 20-27 = Severe depression   Psychosocial Evaluation and Intervention:   Psychosocial Re-Evaluation:   Psychosocial Discharge (Final Psychosocial Re-Evaluation):   Vocational Rehabilitation: Provide vocational rehab assistance to qualifying candidates.   Vocational Rehab Evaluation & Intervention:   Vocational Rehab - 12/19/21 1048       Initial Vocational Rehab Evaluation & Intervention   Assessment shows need for Vocational Rehabilitation No      Vocational Rehab Re-Evaulation   Comments retired, no vocational rehab needs             Education: Education Goals: Education classes will be provided on a weekly basis, covering required topics. Participant will state understanding/return demonstration of topics presented.     Core Videos: Exercise    Move It!  Clinical staff conducted group or individual video education with verbal and written material and guidebook.  Patient learns the recommended Pritikin exercise program. Exercise with the goal of living a long, healthy life. Some of the health benefits of exercise include controlled diabetes, healthier blood pressure levels, improved cholesterol levels, improved heart and lung capacity, improved sleep, and better body composition. Everyone should speak with their doctor before starting or changing an exercise routine.  Biomechanical Limitations Clinical  staff conducted group or individual video education with verbal and written material and guidebook.  Patient learns how biomechanical limitations can impact exercise and how we can mitigate and possibly overcome limitations to have an impactful and balanced exercise routine.  Body Composition Clinical staff conducted group or individual video education with verbal and written material and guidebook.  Patient learns that body composition (ratio of muscle mass to fat mass) is a key component to assessing overall fitness, rather than body weight alone. Increased fat mass, especially visceral belly fat, can put [place] at increased risk for metabolic syndrome, type 2 diabetes, heart disease, and even death. It is recommended to combine diet and exercise (cardiovascular and resistance training) to improve your body composition. Seek guidance from your physician and exercise physiologist  before implementing an exercise routine.  Exercise Action Plan Clinical staff conducted group or individual video education with verbal and written material and guidebook.  Patient learns the recommended strategies to achieve and enjoy long-term exercise adherence, including variety, self-motivation, self-efficacy, and positive decision making. Benefits of exercise include fitness, good health, weight management, more energy, better sleep, less stress, and overall well-being.  Medical   Heart Disease Risk Reduction Clinical staff conducted group or individual video education with verbal and written material and guidebook.  Patient learns our heart is our most vital organ as it circulates oxygen, nutrients, white blood cells, and hormones throughout the entire body, and carries waste away. Data supports a plant-based eating plan like the Pritikin Program for its effectiveness in slowing progression of and reversing heart disease. The video provides a number of recommendations to address heart disease.   Metabolic Syndrome and Belly Fat  Clinical staff conducted group or individual video education with verbal and written material and guidebook.  Patient learns what metabolic syndrome is, how it leads to heart disease, and how one can reverse it and keep it from coming back. You have metabolic syndrome if you have 3 of the following 5 criteria: abdominal obesity, high blood pressure, high triglycerides, low HDL cholesterol, and high blood sugar.  Hypertension and Heart Disease Clinical staff conducted group or individual video education with verbal and written material and guidebook.  Patient learns that high blood pressure, or hypertension, is very common in the [place]. Hypertension is largely due to excessive salt intake, but other important risk factors include being overweight, physical inactivity, drinking too much alcohol, smoking, and not eating enough potassium from fruits and  vegetables. High blood pressure is a leading risk factor for heart attack, stroke, congestive heart failure, dementia, kidney failure, and premature death. Long-term effects of excessive salt intake include stiffening of the arteries and thickening of heart muscle and organ damage. Recommendations include ways to reduce hypertension and the risk of heart disease.  Diseases of Our Time - Focusing on Diabetes Clinical staff conducted group or individual video education with verbal and written material and guidebook.  Patient learns why the best way to stop diseases of our time is prevention, through food and other lifestyle changes. Medicine (such as prescription pills and surgeries) is often only a Band-Aid on the problem, not a long-term solution. Most common diseases of our time include obesity, type 2 diabetes, hypertension, heart disease, and cancer. The Pritikin Program is recommended and has been proven to help reduce, reverse, and/or prevent the damaging effects of metabolic syndrome.  Nutrition   Overview of the Pritikin Eating Plan  Clinical staff conducted group or individual video education with verbal and written  material and guidebook.  Patient learns about the [place] for disease risk reduction. The [place] emphasizes a wide variety of unrefined, minimally-processed carbohydrates, like fruits, vegetables, whole grains, and legumes. Go, Caution, and Stop food choices are explained. Plant-based and lean animal proteins are emphasized. Rationale provided for low sodium intake for blood pressure control, low added sugars for blood sugar stabilization, and low added fats and oils for coronary artery disease risk reduction and weight management.  Calorie Density  Clinical staff conducted group or individual video education with verbal and written material and guidebook.  Patient learns about calorie density and how it impacts the Pritikin Eating Plan. Knowing the  characteristics of the food you choose will help you decide whether those foods will lead to weight gain or weight loss, and whether you want to consume more or less of them. Weight loss is usually a side effect of the Pritikin Eating Plan because of its focus on low calorie-dense foods.  Label Reading  Clinical staff conducted group or individual video education with verbal and written material and guidebook.  Patient learns about the Pritikin recommended label reading guidelines and corresponding recommendations regarding calorie density, added sugars, sodium content, and whole grains.  Dining Out - Part 1  Clinical staff conducted group or individual video education with verbal and written material and guidebook.  Patient learns that restaurant meals can be sabotaging because they can be so high in calories, fat, sodium, and/or sugar. Patient learns recommended strategies on how to positively address this and avoid unhealthy pitfalls.  Facts on Fats  Clinical staff conducted group or individual video education with verbal and written material and guidebook.  Patient learns that lifestyle modifications can be just as effective, if not more so, as many medications for lowering your risk of heart disease. A Pritikin lifestyle can help to reduce your risk of inflammation and atherosclerosis (cholesterol build-up, or plaque, in the artery walls). Lifestyle interventions such as dietary choices and physical activity address the cause of atherosclerosis. A review of the types of fats and their impact on blood cholesterol levels, along with dietary recommendations to reduce fat intake is also included.  Nutrition Action Plan  Clinical staff conducted group or individual video education with verbal and written material and guidebook.  Patient learns how to incorporate Pritikin recommendations into their lifestyle. Recommendations include planning and keeping personal health goals in mind as an important  part of their success.  Healthy Mind-Set    Healthy Minds, Bodies, Hearts  Clinical staff conducted group or individual video education with verbal and written material and guidebook.  Patient learns how to identify when they are stressed. Video will discuss the impact of that stress, as well as the many benefits of stress management. Patient will also be introduced to stress management techniques. The way we think, act, and feel has an impact on our hearts.  How Our Thoughts Can Heal Our Hearts  Clinical staff conducted group or individual video education with verbal and written material and guidebook.  Patient learns that negative thoughts can cause depression and anxiety. This can result in negative lifestyle behavior and serious health problems. Cognitive behavioral therapy is an effective method to help control our thoughts in order to change and improve our emotional outlook.  Additional Videos:  Exercise    Improving Performance  Clinical staff conducted group or individual video education with verbal and written material and guidebook.  Patient learns to use a non-linear approach by alternating  intensity levels and lengths of time spent exercising to help burn more calories and lose more body fat. Cardiovascular exercise helps improve heart health, metabolism, hormonal balance, blood sugar control, and recovery from fatigue. Resistance training improves strength, endurance, balance, coordination, reaction time, metabolism, and muscle mass. Flexibility exercise improves circulation, posture, and balance. Seek guidance from your physician and exercise physiologist before implementing an exercise routine and learn your capabilities and proper form for all exercise.  Introduction to Yoga  Clinical staff conducted group or individual video education with verbal and written material and guidebook.  Patient learns about yoga, a discipline of the coming together of mind, breath, and body. The  benefits of yoga include improved flexibility, improved range of motion, better posture and core strength, increased lung function, weight loss, and positive self-image. Yoga's heart health benefits include lowered blood pressure, healthier heart rate, decreased cholesterol and triglyceride levels, improved immune function, and reduced stress. Seek guidance from your physician and exercise physiologist before implementing an exercise routine and learn your capabilities and proper form for all exercise.  Medical   Aging: Enhancing Your Quality of Life  Clinical staff conducted group or individual video education with verbal and written material and guidebook.  Patient learns key strategies and recommendations to stay in good physical health and enhance quality of life, such as prevention strategies, having an advocate, securing a [place], and keeping a list of medications and system for tracking them. It also discusses how to avoid risk for bone loss.  Biology of Weight Control  Clinical staff conducted group or individual video education with verbal and written material and guidebook.  Patient learns that weight gain occurs because we consume more calories than we burn (eating more, moving less). Even if your body weight is normal, you may have higher ratios of fat compared to muscle mass. Too much body fat puts you at increased risk for cardiovascular disease, heart attack, stroke, type 2 diabetes, and obesity-related cancers. In addition to exercise, following the [place] can help reduce your risk.  Decoding Lab Results  Clinical staff conducted group or individual video education with verbal and written material and guidebook.  Patient learns that lab test reflects one measurement whose values change over time and are influenced by many factors, including medication, stress, sleep, exercise, food, hydration, pre-existing medical conditions, and more. It is  recommended to use the knowledge from this video to become more involved with your lab results and evaluate your numbers to speak with your doctor.   Diseases of Our Time - Overview  Clinical staff conducted group or individual video education with verbal and written material and guidebook.  Patient learns that according to the CDC, 50% to 70% of chronic diseases (such as obesity, type 2 diabetes, elevated lipids, hypertension, and heart disease) are avoidable through lifestyle improvements including healthier food choices, listening to satiety cues, and increased physical activity.  Sleep Disorders Clinical staff conducted group or individual video education with verbal and written material and guidebook.  Patient learns how good quality and duration of sleep are important to overall health and well-being. Patient also learns about sleep disorders and how they impact health along with recommendations to address them, including discussing with a physician.  Nutrition  Dining Out - Part 2 Clinical staff conducted group or individual video education with verbal and written material and guidebook.  Patient learns how to plan ahead and communicate in order to maximize their dining experience  in a healthy and nutritious manner. Included are recommended food choices based on the type of restaurant the patient is visiting.   Fueling a Best boy conducted group or individual video education with verbal and written material and guidebook.  There is a strong connection between our food choices and our health. Diseases like obesity and type 2 diabetes are very prevalent and are in large-part due to lifestyle choices. The Pritikin Eating Plan provides plenty of food and hunger-curbing satisfaction. It is easy to follow, affordable, and helps reduce health risks.  Menu Workshop  Clinical staff conducted group or individual video education with verbal and written material and guidebook.   Patient learns that restaurant meals can sabotage health goals because they are often packed with calories, fat, sodium, and sugar. Recommendations include strategies to plan ahead and to communicate with the manager, chef, or server to help order a healthier meal.  Planning Your Eating Strategy  Clinical staff conducted group or individual video education with verbal and written material and guidebook.  Patient learns about the [place] and its benefit of reducing the risk of disease. The [place] does not focus on calories. Instead, it emphasizes high-quality, nutrient-rich foods. By knowing the characteristics of the foods, we choose, we can determine their calorie density and make informed decisions.  Targeting Your Nutrition Priorities  Clinical staff conducted group or individual video education with verbal and written material and guidebook.  Patient learns that lifestyle habits have a tremendous impact on disease risk and progression. This video provides eating and physical activity recommendations based on your personal health goals, such as reducing LDL cholesterol, losing weight, preventing or controlling type 2 diabetes, and reducing high blood pressure.  Vitamins and Minerals  Clinical staff conducted group or individual video education with verbal and written material and guidebook.  Patient learns different ways to obtain key vitamins and minerals, including through a recommended healthy diet. It is important to discuss all supplements you take with your doctor.   Healthy Mind-Set    Smoking Cessation  Clinical staff conducted group or individual video education with verbal and written material and guidebook.  Patient learns that cigarette smoking and tobacco addiction pose a serious health risk which affects millions of people. Stopping smoking will significantly reduce the risk of heart disease, lung disease, and many forms of cancer. Recommended  strategies for quitting are covered, including working with your doctor to develop a successful plan.  Culinary   Becoming a Financial trader conducted group or individual video education with verbal and written material and guidebook.  Patient learns that cooking at home can be healthy, cost-effective, quick, and puts them in control. Keys to cooking healthy recipes will include looking at your recipe, assessing your equipment needs, planning ahead, making it simple, choosing cost-effective seasonal ingredients, and limiting the use of added fats, salts, and sugars.  Cooking - Breakfast and Snacks  Clinical staff conducted group or individual video education with verbal and written material and guidebook.  Patient learns how important breakfast is to satiety and nutrition through the entire day. Recommendations include key foods to eat during breakfast to help stabilize blood sugar levels and to prevent overeating at meals later in the day. Planning ahead is also a key component.  Cooking - Human resources officer conducted group or individual video education with verbal and written material and guidebook.  Patient learns eating strategies to improve overall health, including  an approach to cook more at home. Recommendations include thinking of animal protein as a side on your plate rather than center stage and focusing instead on lower calorie dense options like vegetables, fruits, whole grains, and plant-based proteins, such as beans. Making sauces in large quantities to freeze for later and leaving the skin on your vegetables are also recommended to maximize your experience.  Cooking - Healthy Salads and Dressing Clinical staff conducted group or individual video education with verbal and written material and guidebook.  Patient learns that vegetables, fruits, whole grains, and legumes are the foundations of the [place]. Recommendations include how to  incorporate each of these in flavorful and healthy salads, and how to create homemade salad dressings. Proper handling of ingredients is also covered. Cooking - Soups and [employer] - Soups and Desserts Clinical staff conducted group or individual video education with verbal and written material and guidebook.  Patient learns that Pritikin soups and desserts make for easy, nutritious, and delicious snacks and meal components that are low in sodium, fat, sugar, and calorie density, while high in vitamins, minerals, and filling fiber. Recommendations include simple and healthy ideas for soups and desserts.   Overview     The Pritikin Solution Program Overview Clinical staff conducted group or individual video education with verbal and written material and guidebook.  Patient learns that the results of the [place] Program have been documented in more than 100 articles published in peer-reviewed journals, and the benefits include reducing risk factors for (and, in some cases, even reversing) high cholesterol, high blood pressure, type 2 diabetes, obesity, and more! An overview of the three key pillars of the Pritikin Program will be covered: eating well, doing regular exercise, and having a healthy mind-set.  WORKSHOPS  Exercise: Exercise Basics: Building Your Action Plan Clinical staff led group instruction and group discussion with PowerPoint presentation and patient guidebook. To enhance the learning environment the use of posters, models and videos may be added. At the conclusion of this workshop, patients will comprehend the difference between physical activity and exercise, as well as the benefits of incorporating both, into their routine. Patients will understand the FITT (Frequency, Intensity, Time, and Type) principle and how to use it to build an exercise action plan. In addition, safety concerns and other considerations for exercise and cardiac rehab will be addressed by the  presenter. The purpose of this lesson is to promote a comprehensive and effective weekly exercise routine in order to improve patients' overall level of fitness.   Managing Heart Disease: Your Path to a Healthier Heart Clinical staff led group instruction and group discussion with PowerPoint presentation and patient guidebook. To enhance the learning environment the use of posters, models and videos may be added.At the conclusion of this workshop, patients will understand the anatomy and physiology of the heart. Additionally, they will understand how Pritikin's three pillars impact the risk factors, the progression, and the management of heart disease.  The purpose of this lesson is to provide a high-level overview of the heart, heart disease, and how the Pritikin lifestyle positively impacts risk factors.  Exercise Biomechanics Clinical staff led group instruction and group discussion with PowerPoint presentation and patient guidebook. To enhance the learning environment the use of posters, models and videos may be added. Patients will learn how the structural parts of their bodies function and how these functions impact their daily activities, movement, and exercise. Patients will learn how to promote a neutral spine, learn how  to manage pain, and identify ways to improve their physical movement in order to promote healthy living. The purpose of this lesson is to expose patients to common physical limitations that impact physical activity. Participants will learn practical ways to adapt and manage aches and pains, and to minimize their effect on regular exercise. Patients will learn how to maintain good posture while sitting, walking, and lifting.  Balance Training and Fall Prevention  Clinical staff led group instruction and group discussion with PowerPoint presentation and patient guidebook. To enhance the learning environment the use of posters, models and videos may be added. At the  conclusion of this workshop, patients will understand the importance of their sensorimotor skills (vision, proprioception, and the vestibular system) in maintaining their ability to balance as they age. Patients will apply a variety of balancing exercises that are appropriate for their current level of function. Patients will understand the common causes for poor balance, possible solutions to these problems, and ways to modify their physical environment in order to minimize their fall risk. The purpose of this lesson is to teach patients about the importance of maintaining balance as they age and ways to minimize their risk of falling.  WORKSHOPS   Nutrition:  Fueling a Scientist, research (physical sciences) led group instruction and group discussion with PowerPoint presentation and patient guidebook. To enhance the learning environment the use of posters, models and videos may be added. Patients will review the foundational principles of the [place] and understand what constitutes a serving size in each of the food groups. Patients will also learn Pritikin-friendly foods that are better choices when away from home and review make-ahead meal and snack options. Calorie density will be reviewed and applied to three nutrition priorities: weight maintenance, weight loss, and weight gain. The purpose of this lesson is to reinforce (in a group setting) the key concepts around what patients are recommended to eat and how to apply these guidelines when away from home by planning and selecting Pritikin-friendly options. Patients will understand how calorie density may be adjusted for different weight management goals.  Mindful Eating  Clinical staff led group instruction and group discussion with PowerPoint presentation and patient guidebook. To enhance the learning environment the use of posters, models and videos may be added. Patients will briefly review the concepts of the [place] and the  importance of low-calorie dense foods. The concept of mindful eating will be introduced as well as the importance of paying attention to internal hunger signals. Triggers for non-hunger eating and techniques for dealing with triggers will be explored. The purpose of this lesson is to provide patients with the opportunity to review the basic principles of the [place], discuss the value of eating mindfully and how to measure internal cues of hunger and fullness using the Hunger Scale. Patients will also discuss reasons for non-hunger eating and learn strategies to use for controlling emotional eating.  Targeting Your Nutrition Priorities Clinical staff led group instruction and group discussion with PowerPoint presentation and patient guidebook. To enhance the learning environment the use of posters, models and videos may be added. Patients will learn how to determine their genetic susceptibility to disease by reviewing their family history. Patients will gain insight into the importance of diet as part of an overall healthy lifestyle in mitigating the impact of genetics and other environmental insults. The purpose of this lesson is to provide patients with the opportunity to assess their personal nutrition priorities by looking at  their family history, their own health history and current risk factors. Patients will also be able to discuss ways of prioritizing and modifying the [place] for their highest risk areas  Menu  Clinical staff led group instruction and group discussion with PowerPoint presentation and patient guidebook. To enhance the learning environment the use of posters, models and videos may be added. Using menus brought in from [employer], or printed from [employer], patients will apply the [place] dining out guidelines that were presented in the [employer] video. Patients will also be able to practice these guidelines in a variety of  provided scenarios. The purpose of this lesson is to provide patients with the opportunity to practice hands-on learning of the [place] with actual menus and practice scenarios.  Label Reading Clinical staff led group instruction and group discussion with PowerPoint presentation and patient guidebook. To enhance the learning environment the use of posters, models and videos may be added. Patients will review and discuss the Pritikin label reading guidelines presented in Pritikin's Label Reading Educational series video. Using fool labels brought in from local grocery stores and markets, patients will apply the label reading guidelines and determine if the packaged food meet the Pritikin guidelines. The purpose of this lesson is to provide patients with the opportunity to review, discuss, and practice hands-on learning of the Pritikin Label Reading guidelines with actual packaged food labels. [place] Workshops are designed to teach patients ways to prepare quick, simple, and affordable recipes at home. The importance of nutrition's role in chronic disease risk reduction is reflected in its emphasis in the overall Pritikin program. By learning how to prepare essential core Pritikin Eating Plan recipes, patients will increase control over what they eat; be able to customize the flavor of foods without the use of added salt, sugar, or fat; and improve the quality of the food they consume. By learning a set of core recipes which are easily assembled, quickly prepared, and affordable, patients are more likely to prepare more healthy foods at home. These workshops focus on convenient breakfasts, simple entres, side dishes, and desserts which can be prepared with minimal effort and are consistent with nutrition recommendations for cardiovascular risk reduction. Cooking [employer] are taught by a Engineer, materials (RD) who has been trained by the  [employer]. The chef or RD has a clear understanding of the importance of minimizing - if not completely eliminating - added fat, sugar, and sodium in recipes. Throughout the series of [place] Workshop sessions, patients will learn about healthy ingredients and efficient methods of cooking to build confidence in their capability to prepare    Cooking School weekly topics:  Adding Flavor- Sodium-Free  Fast and Healthy Breakfasts  Powerhouse Plant-Based Proteins  Satisfying Salads and Dressings  Simple Sides and Sauces  International Cuisine-Spotlight on the [place] Zones  Delicious Desserts  Savory Soups  Efficiency Cooking - Meals in a Snap  Tasty Appetizers and Snacks  Comforting Weekend Breakfasts  One-Pot Wonders   Fast Evening Meals  Easy [place] (Psychosocial): New Thoughts, New Behaviors Clinical staff led group instruction and group discussion with PowerPoint presentation and patient guidebook. To enhance the learning environment the use of posters, models and videos may be added. Patients will learn and practice techniques for developing effective health and lifestyle goals. Patients will be able to effectively apply  the goal setting process learned to develop at least one new personal goal.  The purpose of this lesson is to expose patients to a new skill set of behavior modification techniques such as techniques setting SMART goals, overcoming barriers, and achieving new thoughts and new behaviors.  Managing Moods and Relationships Clinical staff led group instruction and group discussion with PowerPoint presentation and patient guidebook. To enhance the learning environment the use of posters, models and videos may be added. Patients will learn how emotional and chronic stress factors can impact their health and relationships. They will learn healthy ways to manage their moods and utilize  positive coping mechanisms. In addition, ICR patients will learn ways to improve communication skills. The purpose of this lesson is to expose patients to ways of understanding how one's mood and health are intimately connected. Developing a healthy outlook can help build positive relationships and connections with others. Patients will understand the importance of utilizing effective communication skills that include actively listening and being heard. They will learn and understand the importance of the "4 Cs" and especially Connections in fostering of a Healthy Mind-Set.  Healthy Sleep for a Healthy Heart Clinical staff led group instruction and group discussion with PowerPoint presentation and patient guidebook. To enhance the learning environment the use of posters, models and videos may be added. At the conclusion of this workshop, patients will be able to demonstrate knowledge of the importance of sleep to overall health, well-being, and quality of life. They will understand the symptoms of, and treatments for, common sleep disorders. Patients will also be able to identify daytime and nighttime behaviors which impact sleep, and they will be able to apply these tools to help manage sleep-related challenges. The purpose of this lesson is to provide patients with a general overview of sleep and outline the importance of quality sleep. Patients will learn about a few of the most common sleep disorders. Patients will also be introduced to the concept of "sleep hygiene," and discover ways to self-manage certain sleeping problems through simple daily behavior changes. Finally, the workshop will motivate patients by clarifying the links between quality sleep and their goals of heart-healthy living.   Recognizing and Reducing Stress Clinical staff led group instruction and group discussion with PowerPoint presentation and patient guidebook. To enhance the learning environment the use of posters, models and  videos may be added. At the conclusion of this workshop, patients will be able to understand the types of stress reactions, differentiate between acute and chronic stress, and recognize the impact that chronic stress has on their health. They will also be able to apply different coping mechanisms, such as reframing negative self-talk. Patients will have the opportunity to practice a variety of stress management techniques, such as deep abdominal breathing, progressive muscle relaxation, and/or guided imagery.  The purpose of this lesson is to educate patients on the role of stress in their lives and to provide healthy techniques for coping with it.  Learning Barriers/Preferences:  Learning Barriers/Preferences - 12/19/21 1328       Learning Barriers/Preferences   Learning Barriers Hearing;Sight   pt wears glasses   Learning Preferences Written Material;Skilled Demonstration;Individual Instruction;Group Instruction;Pictoral             Education Topics:  Knowledge Questionnaire Score:  Knowledge Questionnaire Score - 12/19/21 1329       Knowledge Questionnaire Score   Pre Score 21/24             Core Components/Risk Factors/Patient Goals at  Admission:  Personal Goals and Risk Factors at Admission - 12/19/21 1048       Core Components/Risk Factors/Patient Goals on Admission    Weight Management Weight Maintenance;Yes    Intervention Weight Management: Develop a combined nutrition and exercise program designed to reach desired caloric intake, while maintaining appropriate intake of nutrient and fiber, sodium and fats, and appropriate energy expenditure required for the weight goal.;Weight Management: Provide education and appropriate resources to help participant work on and attain dietary goals.    Admit Weight 178 lb 5.6 oz (80.9 kg)    Expected Outcomes Long Term: Adherence to nutrition and physical activity/exercise program aimed toward attainment of established weight  goal;Weight Maintenance: Understanding of the daily nutrition guidelines, which includes 25-35% calories from fat, 7% or less cal from saturated fats, less than '200mg'$  cholesterol, less than 1.5gm of sodium, & 5 or more servings of fruits and vegetables daily;Understanding recommendations for meals to include 15-35% energy as protein, 25-35% energy from fat, 35-60% energy from carbohydrates, less than '200mg'$  of dietary cholesterol, 20-35 gm of total fiber daily    Hypertension Yes    Intervention Provide education on lifestyle modifcations including regular physical activity/exercise, weight management, moderate sodium restriction and increased consumption of fresh fruit, vegetables, and low fat dairy, alcohol moderation, and smoking cessation.;Monitor prescription use compliance.    Expected Outcomes Short Term: Continued assessment and intervention until BP is < 140/63m HG in hypertensive participants. < 130/867mHG in hypertensive participants with diabetes, heart failure or chronic kidney disease.;Long Term: Maintenance of blood pressure at goal levels.    Lipids Yes    Intervention Provide education and support for participant on nutrition & aerobic/resistive exercise along with prescribed medications to achieve LDL '70mg'$ , HDL >'40mg'$ .    Expected Outcomes Short Term: Participant states understanding of desired cholesterol values and is compliant with medications prescribed. Participant is following exercise prescription and nutrition guidelines.;Long Term: Cholesterol controlled with medications as prescribed, with individualized exercise RX and with personalized nutrition plan. Value goals: LDL < '70mg'$ , HDL > 40 mg.    Personal Goal JaKazutoesires [facility] as well as mental well being    Intervention Provide support and encouragement to increase physical activity.  Strongly encourage consistent home exercise and routine.    Expected Outcomes JaMarielwill report to cardiac rehab staff he has return to playing pickleball with no complaints or issues             Core Components/Risk Factors/Patient Goals Review:    Core Components/Risk Factors/Patient Goals at Discharge (Final Review):    ITP Comments:  ITP Comments     Row Name 12/19/21 1129           ITP Comments Dr. TuRadford Paxedical Director Introduction to  Pritikin education Program/Intensive Cardiac Rehab.  Initial Pritikin Orientation packet and video reviewed with pt                Comments:Participant who is accompanied by his girlfriend attended orientation for Intensive cardiac rehabilitation program on 12/19/2021  to perform initial intake and exercise walk test. Patient introduced to the Pr[place]ation and orientation packet was reviewed. Completed 6-minute walk test, measurements, initial ITP, and exercise prescription. Vital signs stable. Telemetry-Ventricular paced.  Pt is asymptomatic.    Service time was from 0835 to 10BedfordBSN Cardiac and Pulmonary Rehab Nurse Navigator

## 2021-12-23 ENCOUNTER — Encounter (HOSPITAL_COMMUNITY)
Admission: RE | Admit: 2021-12-23 | Discharge: 2021-12-23 | Disposition: A | Discharge status: Home / Self Care | Payer: MEDICARE | Source: Ambulatory Visit | Attending: Cardiology | Admitting: Cardiology

## 2021-12-23 DIAGNOSIS — I252 Old myocardial infarction: Secondary | ICD-10-CM | POA: Diagnosis not present

## 2021-12-23 DIAGNOSIS — I214 Non-ST elevation (NSTEMI) myocardial infarction: Secondary | ICD-10-CM

## 2021-12-23 NOTE — Progress Notes (Signed)
Daily Session Note  Patient Details  Name: Phillip Allen MRN: [id number] Date of Birth: Jan 02, 1941 Referring Provider:   Flowsheet Row INTENSIVE CARDIAC REHAB ORIENT from 12/19/2021 in [facility]  Referring Provider Dr Vernell Leep, MD       Encounter Date: 12/23/2021  Check In:  Session Check In - 12/23/21 1518       Check-In   Supervising physician immediately available to respond to emergencies [facility] immediately available    Physician(s) Dr. Lonny Prude    Location [facility]    Staff Present Maurice Small, RN, Milus Glazier, MS, ACSM-CEP, CCRP, Exercise Physiologist;Vitaliy Eisenhour, RN, Deland Pretty, MS, ACSM-CEP, Exercise Physiologist    Virtual Visit No    Medication changes reported     No    Fall or balance concerns reported    No    Tobacco Cessation No Change    Warm-up and Cool-down Performed as group-led instruction    Resistance Training Performed Yes    VAD Patient? No    PAD/SET Patient? No      Pain Assessment   Currently in Pain? No/denies    Pain Score 0-No pain    Multiple Pain Sites No             Capillary Blood Glucose: No results found for this or any previous visit (from the past 24 hour(s)).   Exercise Prescription Changes - 12/23/21 1457       Response to Exercise   Blood Pressure (Admit) 122/64    Blood Pressure (Exercise) 144/72    Blood Pressure (Exit) 118/70    Heart Rate (Admit) 64 bpm    Heart Rate (Exercise) 92 bpm    Heart Rate (Exit) 63 bpm    Rating of Perceived Exertion (Exercise) 11    Symptoms None    Comments Off to a great start with exercise.    Duration Continue with 30 min of aerobic exercise without signs/symptoms of physical distress.    Intensity THRR unchanged      Progression   Progression Continue to progress workloads to maintain intensity without signs/symptoms of physical distress.    Average METs 2.6      Resistance Training    Training Prescription Yes    Weight 3    Reps 10-15    Time 10 Minutes      Interval Training   Interval Training No      Recumbant Bike   Level 2    RPM 60    Minutes 15    METs 2.8      NuStep   Level 2    SPM 96    Minutes 15    METs 2.5             Social History   Tobacco Use  Smoking Status Former   Packs/day: 0.25   Years: 10.00   Total pack years: 2.50   Types: Cigarettes   Quit date: 1996   Years since quitting: 27.6  Smokeless Tobacco Never  Tobacco Comments   on/off since age 83    Goals Met:  Exercise tolerated well No report of concerns or symptoms today Strength training completed today  Goals Unmet:  Not Applicable  Comments: Pt started cardiac rehab today.  Pt tolerated light exercise without difficulty. VSS, telemetry-V paced, asymptomatic.  Medication list reconciled. Pt denies barriers to medicaiton compliance.  PSYCHOSOCIAL ASSESSMENT:  PHQ-6. Pt exhibits positive coping skills, hopeful outlook with supportive family.Clair Gulling  says he recently lost his brother has is girlfriend for support and is taking an antidepressant at this time.    Pt enjoys pickle ball, dancing and attending Grandson's soccer games.   Pt oriented to exercise equipment and routine.    Understanding verbalized. Harrell Gave RN BSN    Dr. Fransico Him is Medical Director for Cardiac Rehab at [facility].

## 2021-12-24 ENCOUNTER — Other Ambulatory Visit: Payer: Self-pay

## 2021-12-24 ENCOUNTER — Telehealth: Payer: Self-pay

## 2021-12-24 DIAGNOSIS — Z23 Encounter for immunization: Secondary | ICD-10-CM | POA: Diagnosis not present

## 2021-12-24 MED ORDER — METOPROLOL SUCCINATE ER 25 MG PO TB24: 25.0000 mg | ORAL_TABLET | Freq: Every day | ORAL | 0 refills | Status: AC

## 2021-12-24 MED ORDER — METOPROLOL SUCCINATE ER 25 MG PO TB24: 25.0000 mg | ORAL_TABLET | Freq: Every day | ORAL | 3 refills | Status: DC

## 2021-12-24 NOTE — Telephone Encounter (Signed)
Pt called needing rf on metoprolol. Completely out so needs some sent to [facility] and to [facility]

## 2021-12-24 NOTE — Telephone Encounter (Signed)
Medication has been refilled.

## 2021-12-25 ENCOUNTER — Encounter (HOSPITAL_COMMUNITY)
Admission: RE | Admit: 2021-12-25 | Discharge: 2021-12-25 | Disposition: A | Discharge status: Home / Self Care | Payer: MEDICARE | Source: Ambulatory Visit | Attending: Cardiology | Admitting: Cardiology

## 2021-12-25 ENCOUNTER — Ambulatory Visit: Payer: MEDICARE | Admitting: Cardiology

## 2021-12-25 DIAGNOSIS — F4323 Adjustment disorder with mixed anxiety and depressed mood: Secondary | ICD-10-CM | POA: Diagnosis not present

## 2021-12-25 DIAGNOSIS — I214 Non-ST elevation (NSTEMI) myocardial infarction: Secondary | ICD-10-CM

## 2021-12-25 DIAGNOSIS — I252 Old myocardial infarction: Secondary | ICD-10-CM | POA: Diagnosis not present

## 2021-12-25 NOTE — Progress Notes (Signed)
Cardiac Individual Treatment Plan  Patient Details  Name: Phillip Allen MRN: [id number] Date of Birth: [birth year] Referring Provider:   Flowsheet Row INTENSIVE CARDIAC REHAB ORIENT from 12/19/2021 in [place]  Referring Provider Dr Vernell Leep, MD       Initial Encounter Date:  [facility] from 12/19/2021 in [place]  Date 12/19/21       Visit Diagnosis: NSTEMI (non-ST elevated myocardial infarction) [facility])  Patient's Home Medications on Admission:  Current Outpatient Medications:    amLODipine (NORVASC) 5 MG tablet, Take 1 tablet (5 mg total) by mouth daily. (Patient not taking: Reported on 12/17/2021), Disp: 90 tablet, Rfl: 3   aspirin EC 81 MG tablet, Take 1 tablet (81 mg total) by mouth daily., Disp: 30 tablet, Rfl: 3   Collagen-Boron-Hyaluronic Acid (MOVE FREE ULTRA JOINT HEALTH PO), Take 1 tablet by mouth daily., Disp: , Rfl:    diclofenac Sodium (VOLTAREN) 1 % GEL, Apply 1 application  topically 4 (four) times daily as needed (pain)., Disp: , Rfl:    docusate sodium (COLACE) 100 MG capsule, Take 100 mg by mouth 2 (two) times daily., Disp: , Rfl:    FARXIGA 5 MG TABS tablet, Take 5 mg by mouth every morning., Disp: , Rfl:    Krill Oil 500 MG CAPS, Take 500 mg by mouth daily., Disp: , Rfl:    losartan (COZAAR) 50 MG tablet, TAKE 1 TABLET (50 MG TOTAL) BY MOUTH DAILY. (Patient taking differently: Take 25 mg by mouth daily.), Disp: 90 tablet, Rfl: 1   metoprolol succinate (TOPROL XL) 25 MG 24 hr tablet, Take 1 tablet (25 mg total) by mouth daily., Disp: 30 tablet, Rfl: 0   Multiple Vitamins-Minerals (MULTIVITAMIN MEN 50+ PO), Take 1 tablet by mouth daily., Disp: , Rfl:    nitroGLYCERIN (NITROSTAT) 0.4 MG SL tablet, Place 1 tablet (0.4 mg total) under the tongue every 5 (five) minutes as needed for chest pain., Disp: 30 tablet, Rfl: 3   Polyvinyl Alcohol-Povidone (REFRESH OP),  Place 1 drop into both eyes daily as needed (dry eyes)., Disp: , Rfl:    rosuvastatin (CRESTOR) 40 MG tablet, Take 40 mg by mouth daily., Disp: , Rfl:    vortioxetine HBr (TRINTELLIX) 20 MG TABS tablet, Take 20 mg by mouth daily., Disp: , Rfl:   Past Medical History: Past Medical History:  Diagnosis Date   Depression    Hyperlipidemia     Tobacco Use: Social History   Tobacco Use  Smoking Status Former   Packs/day: 0.25   Years: 10.00   Total pack years: 2.50   Types: Cigarettes   Quit date: 1996   Years since quitting: 27.6  Smokeless Tobacco Never  Tobacco Comments   on/off since age 3    Labs: Review Flowsheet        No data to display          Capillary Blood Glucose: No results found for: "GLUCAP"   Exercise Target Goals: Exercise Program Goal: Individual exercise prescription set using results from initial 6 min walk test and THRR while considering  patient's activity barriers and safety.   Exercise Prescription Goal: Initial exercise prescription builds to 30-45 minutes a day of aerobic activity, 2-3 days per week.  Home exercise guidelines will be given to patient during program as part of exercise prescription that the participant will acknowledge.  Activity Barriers & Risk Stratification:  Activity Barriers & Cardiac Risk Stratification -  12/19/21 1321       Activity Barriers & Cardiac Risk Stratification   Activity Barriers Back Problems;Joint Problems    Cardiac Risk Stratification High             6 Minute Walk:  6 Minute Walk     Row Name 12/19/21 1319         6 Minute Walk   Phase Initial     Distance 1511 feet     Walk Time 6 minutes     # of Rest Breaks 0     MPH 2.86     METS 2.56     RPE 9     Perceived Dyspnea  0     VO2 Peak 8.94     Symptoms No     Resting HR 56 bpm     Resting BP 107/58     Resting Oxygen Saturation  97 %     Exercise Oxygen Saturation  during 6 min walk 98 %     Max Ex. HR 76 bpm     Max Ex.  BP 126/58     2 Minute Post BP 122/62              Oxygen Initial Assessment:   Oxygen Re-Evaluation:   Oxygen Discharge (Final Oxygen Re-Evaluation):   Initial Exercise Prescription:  Initial Exercise Prescription - 12/19/21 1300       Date of Initial Exercise RX and Referring Provider   Date 12/19/21    Referring Provider Dr Vernell Leep, MD    Expected Discharge Date 02/21/22      Recumbant Bike   Level 2    RPM 60    Minutes 15    METs 2.3      NuStep   Level 2    SPM 80    Minutes 15    METs 2.3      Prescription Details   Frequency (times per week) 3    Duration Progress to 30 minutes of continuous aerobic without signs/symptoms of physical distress      Intensity   THRR 40-80% of Max Heartrate 56-113    Ratings of Perceived Exertion 11-13    Perceived Dyspnea 0-4      Progression   Progression Continue progressive overload as per policy without signs/symptoms or physical distress.      Resistance Training   Training Prescription Yes    Weight 3    Reps 10-15             Perform Capillary Blood Glucose checks as needed.  Exercise Prescription Changes:   Exercise Prescription Changes     Row Name 12/23/21 1457             Response to Exercise   Blood Pressure (Admit) 122/64       Blood Pressure (Exercise) 144/72       Blood Pressure (Exit) 118/70       Heart Rate (Admit) 64 bpm       Heart Rate (Exercise) 92 bpm       Heart Rate (Exit) 63 bpm       Rating of Perceived Exertion (Exercise) 11       Symptoms None       Comments Off to a great start with exercise.       Duration Continue with 30 min of aerobic exercise without signs/symptoms of physical distress.       Intensity THRR unchanged  Progression   Progression Continue to progress workloads to maintain intensity without signs/symptoms of physical distress.       Average METs 2.6         Resistance Training   Training Prescription Yes       Weight 3        Reps 10-15       Time 10 Minutes         Interval Training   Interval Training No         Recumbant Bike   Level 2       RPM 60       Minutes 15       METs 2.8         NuStep   Level 2       SPM 96       Minutes 15       METs 2.5                Exercise Comments:   Exercise Comments     Row Name 12/23/21 1559           Exercise Comments Patient tolerated 1st session of exercise well without symptoms.                Exercise Goals and Review:   Exercise Goals     Row Name 12/19/21 1325             Exercise Goals   Increase Physical Activity Yes       Intervention Provide advice, education, support and counseling about physical activity/exercise needs.;Develop an individualized exercise prescription for aerobic and resistive training based on initial evaluation findings, risk stratification, comorbidities and participant's personal goals.       Expected Outcomes Short Term: Attend rehab on a regular basis to increase amount of physical activity.;Long Term: Add in home exercise to make exercise part of routine and to increase amount of physical activity.;Long Term: Exercising regularly at least 3-5 days a week.       Increase Strength and Stamina Yes       Intervention Provide advice, education, support and counseling about physical activity/exercise needs.;Develop an individualized exercise prescription for aerobic and resistive training based on initial evaluation findings, risk stratification, comorbidities and participant's personal goals.       Expected Outcomes Short Term: Increase workloads from initial exercise prescription for resistance, speed, and METs.;Short Term: Perform resistance training exercises routinely during rehab and add in resistance training at home;Long Term: Improve cardiorespiratory fitness, muscular endurance and strength as measured by increased METs and functional capacity (6MWT)       Able to understand and use rate of  perceived exertion (RPE) scale Yes       Intervention Provide education and explanation on how to use RPE scale       Expected Outcomes Short Term: Able to use RPE daily in rehab to express subjective intensity level;Long Term:  Able to use RPE to guide intensity level when exercising independently       Knowledge and understanding of Target Heart Rate Range (THRR) Yes       Intervention Provide education and explanation of THRR including how the numbers were predicted and where they are located for reference       Expected Outcomes Short Term: Able to state/look up THRR;Long Term: Able to use THRR to govern intensity when exercising independently;Short Term: Able to use daily as guideline for intensity in rehab  Exercise Goals Re-Evaluation :  Exercise Goals Re-Evaluation     [place] Name 12/23/21 1559             Exercise Goal Re-Evaluation   Exercise Goals Review Increase Physical Activity;Able to understand and use rate of perceived exertion (RPE) scale       Comments Patient able to understand and use RPE scale appropriately.       Expected Outcomes Progress workloads as tolerated to improve cardiorespiratory fitness and stamina.                Discharge Exercise Prescription (Final Exercise Prescription Changes):  Exercise Prescription Changes - 12/23/21 1457       Response to Exercise   Blood Pressure (Admit) 122/64    Blood Pressure (Exercise) 144/72    Blood Pressure (Exit) 118/70    Heart Rate (Admit) 64 bpm    Heart Rate (Exercise) 92 bpm    Heart Rate (Exit) 63 bpm    Rating of Perceived Exertion (Exercise) 11    Symptoms None    Comments Off to a great start with exercise.    Duration Continue with 30 min of aerobic exercise without signs/symptoms of physical distress.    Intensity THRR unchanged      Progression   Progression Continue to progress workloads to maintain intensity without signs/symptoms of physical distress.    Average METs 2.6       Resistance Training   Training Prescription Yes    Weight 3    Reps 10-15    Time 10 Minutes      Interval Training   Interval Training No      Recumbant Bike   Level 2    RPM 60    Minutes 15    METs 2.8      NuStep   Level 2    SPM 96    Minutes 15    METs 2.5             Nutrition:  Target Goals: Understanding of nutrition guidelines, daily intake of sodium '1500mg'$ , cholesterol '200mg'$ , calories 30% from fat and 7% or less from saturated fats, daily to have 5 or more servings of fruits and vegetables.  Biometrics:  Pre Biometrics - 12/19/21 1319       Pre Biometrics   Waist Circumference 39.75 inches    Hip Circumference 39 inches    Waist to Hip Ratio 1.02 %    Triceps Skinfold 8 mm    % Body Fat 25.1 %    Grip Strength 38 kg    Flexibility --   Pt unable to reach   Single Leg Stand 30 seconds              Nutrition Therapy Plan and Nutrition Goals:  Nutrition Therapy & Goals - 12/23/21 1627       Nutrition Therapy   Diet Heart Healthy Diet    Drug/Food Interactions Statins/Certain Fruits      Personal Nutrition Goals   Nutrition Goal Patient to choose a daily variety of fruit, vegetables, whole grains, lean protein/plant protein, nonfat dairy as part of heart healthy lifestyle    Personal Goal #2 Patient to limit sodium to '1500mg'$  daily    Personal Goal #3 Patient to identify food sources of trans fats, saturated fat, sodium, and refined carbohydrates    Comments Patient reports interest in making dietary changes and better convenience options for heart health.      Intervention Plan  Intervention Prescribe, educate and counsel regarding individualized specific dietary modifications aiming towards targeted core components such as weight, hypertension, lipid management, diabetes, heart failure and other comorbidities.;Nutrition handout(s) given to patient.    Expected Outcomes Short Term Goal: Understand basic principles of dietary  content, such as calories, fat, sodium, cholesterol and nutrients.;Long Term Goal: Adherence to prescribed nutrition plan.             Nutrition Assessments:  MEDIFICTS Score Key: ?70 Need to make dietary changes  40-70 Heart Healthy Diet ? 40 Therapeutic Level Cholesterol Diet    Picture Your Plate Scores: <46 Unhealthy dietary pattern with much room for improvement. 41-50 Dietary pattern unlikely to meet recommendations for good health and room for improvement. 51-60 More healthful dietary pattern, with some room for improvement.  >60 Healthy dietary pattern, although there may be some specific behaviors that could be improved.    Nutrition Goals Re-Evaluation:  Nutrition Goals Re-Evaluation     Phillip Allen Name 12/23/21 1627             Goals   Current Weight 177 lb (80.3 kg)       Comment Lipoprotein A 99.1. Labs from December 2021 per cardiology notes show A1c 6.3       Expected Outcome Patient reports interest in making dietary changes and better convenience options for heart health.                Nutrition Goals Re-Evaluation:  Nutrition Goals Re-Evaluation     Phillip Allen Name 12/23/21 1627             Goals   Current Weight 177 lb (80.3 kg)       Comment Lipoprotein A 99.1. Labs from December 2021 per cardiology notes show A1c 6.3       Expected Outcome Patient reports interest in making dietary changes and better convenience options for heart health.                Nutrition Goals Discharge (Final Nutrition Goals Re-Evaluation):  Nutrition Goals Re-Evaluation - 12/23/21 1627       Goals   Current Weight 177 lb (80.3 kg)    Comment Lipoprotein A 99.1. Labs from December 2021 per cardiology notes show A1c 6.3    Expected Outcome Patient reports interest in making dietary changes and better convenience options for heart health.             Psychosocial: Target Goals: Acknowledge presence or absence of significant depression and/or stress,  maximize coping skills, provide positive support system. Participant is able to verbalize types and ability to use techniques and skills needed for reducing stress and depression.  Initial Review & Psychosocial Screening:  Initial Psych Review & Screening - 12/19/21 1102       Initial Review   Current issues with Current Depression;History of Depression      Family Dynamics   Good Support System? Yes    Comments Phillip Allen who has a history of and current depression is dealing with the recent loss of his older brother.  Phillip Allen feels supported by his girlfriend.  Phillip Allen is currently on a anti-depressent medication -Trintellix is working well for him.  Sees couselor for several years every other week      Barriers   Psychosocial barriers to participate in program The patient should benefit from training in stress management and relaxation.      Screening Interventions   Interventions Encouraged to exercise;Provide feedback about the scores to participant  Expected Outcomes Short Term goal: Identification and review with participant of any Quality of Life or Depression concerns found by scoring the questionnaire.;Long Term goal: The participant improves quality of Life and PHQ9 Scores as seen by post scores and/or verbalization of changes             Quality of Life Scores:  Quality of Life - 12/19/21 1327       Quality of Life   Select Quality of Life      Quality of Life Scores   Health/Function Pre 22.13 %    Socioeconomic Pre 25 %    Psych/Spiritual Pre 18.33 %    Family Pre 22.1 %    GLOBAL Pre 22.05 %            Scores of 19 and below usually indicate a poorer quality of life in these areas.  A difference of  2-3 points is a clinically meaningful difference.  A difference of 2-3 points in the total score of the Quality of Life Index has been associated with significant improvement in overall quality of life, self-image, physical symptoms, and general health in studies  assessing change in quality of life.  PHQ-9: Review Flowsheet       12/19/2021  Depression screen PHQ 2/9  Decreased Interest 1  Down, Depressed, Hopeless 1  PHQ - 2 Score 2  Altered sleeping 1  Tired, decreased energy 2  Change in appetite 1  Feeling bad or failure about yourself  0  Trouble concentrating 0  Moving slowly or fidgety/restless 0  Suicidal thoughts 0  PHQ-9 Score 6  Difficult doing work/chores Not difficult at all   Interpretation of Total Score  Total Score Depression Severity:  1-4 = Minimal depression, 5-9 = Mild depression, 10-14 = Moderate depression, 15-19 = Moderately severe depression, 20-27 = Severe depression   Psychosocial Evaluation and Intervention:   Psychosocial Re-Evaluation:  Psychosocial Re-Evaluation     [place] Name 12/23/21 1721             Psychosocial Re-Evaluation   Current issues with Current Depression;History of Depression       Comments Phillip Allen denied having any increased depression or stress on his first day of exercise. Will review quality of life in the upcoming week       Expected Outcomes Phillip Allen will have decreased on controlled depression upon completion of intensive cardiac rehab.       Interventions Encouraged to attend Cardiac Rehabilitation for the exercise;Relaxation education;Stress management education       Continue Psychosocial Services  Follow up required by staff                Psychosocial Discharge (Final Psychosocial Re-Evaluation):  Psychosocial Re-Evaluation - 12/23/21 1721       Psychosocial Re-Evaluation   Current issues with Current Depression;History of Depression    Comments Phillip Allen denied having any increased depression or stress on his first day of exercise. Will review quality of life in the upcoming week    Expected Outcomes Phillip Allen will have decreased on controlled depression upon completion of intensive cardiac rehab.    Interventions Encouraged to attend Cardiac Rehabilitation for the  exercise;Relaxation education;Stress management education    Continue Psychosocial Services  Follow up required by staff             Vocational Rehabilitation: Provide vocational rehab assistance to qualifying candidates.   Vocational Rehab Evaluation & Intervention:  Vocational Rehab - 12/19/21 1048  Initial Vocational Rehab Evaluation & Intervention   Assessment shows need for Vocational Rehabilitation No      Vocational Rehab Re-Evaulation   Comments retired, no vocational rehab needs             Education: Education Goals: Education classes will be provided on a weekly basis, covering required topics. Participant will state understanding/return demonstration of topics presented.    Education     Row Name 12/23/21 1500     Education   Cardiac Education Topics Pritikin   Environmental consultant Psychosocial   Psychosocial Workshop Recognizing and Reducing Stress   Instruction Review Code 1- Verbalizes Understanding   Class Start Time 1400   Class Stop Time 1447   Class Time Calculation (min) 47 min            Core Videos: Exercise    Move It!  Clinical staff conducted group or individual video education with verbal and written material and guidebook.  Patient learns the recommended Pritikin exercise program. Exercise with the goal of living a long, healthy life. Some of the health benefits of exercise include controlled diabetes, healthier blood pressure levels, improved cholesterol levels, improved heart and lung capacity, improved sleep, and better body composition. Everyone should speak with their doctor before starting or changing an exercise routine.  Biomechanical Limitations Clinical staff conducted group or individual video education with verbal and written material and guidebook.  Patient learns how biomechanical limitations can impact exercise and how we can mitigate and possibly overcome  limitations to have an impactful and balanced exercise routine.  Body Composition Clinical staff conducted group or individual video education with verbal and written material and guidebook.  Patient learns that body composition (ratio of muscle mass to fat mass) is a key component to assessing overall fitness, rather than body weight alone. Increased fat mass, especially visceral belly fat, can put [place] at increased risk for metabolic syndrome, type 2 diabetes, heart disease, and even death. It is recommended to combine diet and exercise (cardiovascular and resistance training) to improve your body composition. Seek guidance from your physician and exercise physiologist before implementing an exercise routine.  Exercise Action Plan Clinical staff conducted group or individual video education with verbal and written material and guidebook.  Patient learns the recommended strategies to achieve and enjoy long-term exercise adherence, including variety, self-motivation, self-efficacy, and positive decision making. Benefits of exercise include fitness, good health, weight management, more energy, better sleep, less stress, and overall well-being.  Medical   Heart Disease Risk Reduction Clinical staff conducted group or individual video education with verbal and written material and guidebook.  Patient learns our heart is our most vital organ as it circulates oxygen, nutrients, white blood cells, and hormones throughout the entire body, and carries waste away. Data supports a plant-based eating plan like the Pritikin Program for its effectiveness in slowing progression of and reversing heart disease. The video provides a number of recommendations to address heart disease.   Metabolic Syndrome and Belly Fat  Clinical staff conducted group or individual video education with verbal and written material and guidebook.  Patient learns what metabolic syndrome is, how it leads to heart disease, and how one can  reverse it and keep it from coming back. You have metabolic syndrome if you have 3 of the following 5 criteria: abdominal obesity, high blood pressure, high triglycerides, low HDL cholesterol, and high blood sugar.  Hypertension and  Heart Disease Clinical staff conducted group or individual video education with verbal and written material and guidebook.  Patient learns that high blood pressure, or hypertension, is very common in the [place]. Hypertension is largely due to excessive salt intake, but other important risk factors include being overweight, physical inactivity, drinking too much alcohol, smoking, and not eating enough potassium from fruits and vegetables. High blood pressure is a leading risk factor for heart attack, stroke, congestive heart failure, dementia, kidney failure, and premature death. Long-term effects of excessive salt intake include stiffening of the arteries and thickening of heart muscle and organ damage. Recommendations include ways to reduce hypertension and the risk of heart disease.  Diseases of Our Time - Focusing on Diabetes Clinical staff conducted group or individual video education with verbal and written material and guidebook.  Patient learns why the best way to stop diseases of our time is prevention, through food and other lifestyle changes. Medicine (such as prescription pills and surgeries) is often only a Band-Aid on the problem, not a long-term solution. Most common diseases of our time include obesity, type 2 diabetes, hypertension, heart disease, and cancer. The Pritikin Program is recommended and has been proven to help reduce, reverse, and/or prevent the damaging effects of metabolic syndrome.  Nutrition   Overview of the Pritikin Eating Plan  Clinical staff conducted group or individual video education with verbal and written material and guidebook.  Patient learns about the [place] for disease risk reduction. The [place]  emphasizes a wide variety of unrefined, minimally-processed carbohydrates, like fruits, vegetables, whole grains, and legumes. Go, Caution, and Stop food choices are explained. Plant-based and lean animal proteins are emphasized. Rationale provided for low sodium intake for blood pressure control, low added sugars for blood sugar stabilization, and low added fats and oils for coronary artery disease risk reduction and weight management.  Calorie Density  Clinical staff conducted group or individual video education with verbal and written material and guidebook.  Patient learns about calorie density and how it impacts the Pritikin Eating Plan. Knowing the characteristics of the food you choose will help you decide whether those foods will lead to weight gain or weight loss, and whether you want to consume more or less of them. Weight loss is usually a side effect of the Pritikin Eating Plan because of its focus on low calorie-dense foods.  Label Reading  Clinical staff conducted group or individual video education with verbal and written material and guidebook.  Patient learns about the Pritikin recommended label reading guidelines and corresponding recommendations regarding calorie density, added sugars, sodium content, and whole grains.  Dining Out - Part 1  Clinical staff conducted group or individual video education with verbal and written material and guidebook.  Patient learns that restaurant meals can be sabotaging because they can be so high in calories, fat, sodium, and/or sugar. Patient learns recommended strategies on how to positively address this and avoid unhealthy pitfalls.  Facts on Fats  Clinical staff conducted group or individual video education with verbal and written material and guidebook.  Patient learns that lifestyle modifications can be just as effective, if not more so, as many medications for lowering your risk of heart disease. A Pritikin lifestyle can help to reduce your  risk of inflammation and atherosclerosis (cholesterol build-up, or plaque, in the artery walls). Lifestyle interventions such as dietary choices and physical activity address the cause of atherosclerosis. A review of the types of fats and their impact  on blood cholesterol levels, along with dietary recommendations to reduce fat intake is also included.  Nutrition Action Plan  Clinical staff conducted group or individual video education with verbal and written material and guidebook.  Patient learns how to incorporate Pritikin recommendations into their lifestyle. Recommendations include planning and keeping personal health goals in mind as an important part of their success.  Healthy Mind-Set    Healthy Minds, Bodies, Hearts  Clinical staff conducted group or individual video education with verbal and written material and guidebook.  Patient learns how to identify when they are stressed. Video will discuss the impact of that stress, as well as the many benefits of stress management. Patient will also be introduced to stress management techniques. The way we think, act, and feel has an impact on our hearts.  How Our Thoughts Can Heal Our Hearts  Clinical staff conducted group or individual video education with verbal and written material and guidebook.  Patient learns that negative thoughts can cause depression and anxiety. This can result in negative lifestyle behavior and serious health problems. Cognitive behavioral therapy is an effective method to help control our thoughts in order to change and improve our emotional outlook.  Additional Videos:  Exercise    Improving Performance  Clinical staff conducted group or individual video education with verbal and written material and guidebook.  Patient learns to use a non-linear approach by alternating intensity levels and lengths of time spent exercising to help burn more calories and lose more body fat. Cardiovascular exercise helps improve heart  health, metabolism, hormonal balance, blood sugar control, and recovery from fatigue. Resistance training improves strength, endurance, balance, coordination, reaction time, metabolism, and muscle mass. Flexibility exercise improves circulation, posture, and balance. Seek guidance from your physician and exercise physiologist before implementing an exercise routine and learn your capabilities and proper form for all exercise.  Introduction to Yoga  Clinical staff conducted group or individual video education with verbal and written material and guidebook.  Patient learns about yoga, a discipline of the coming together of mind, breath, and body. The benefits of yoga include improved flexibility, improved range of motion, better posture and core strength, increased lung function, weight loss, and positive self-image. Yoga's heart health benefits include lowered blood pressure, healthier heart rate, decreased cholesterol and triglyceride levels, improved immune function, and reduced stress. Seek guidance from your physician and exercise physiologist before implementing an exercise routine and learn your capabilities and proper form for all exercise.  Medical   Aging: Enhancing Your Quality of Life  Clinical staff conducted group or individual video education with verbal and written material and guidebook.  Patient learns key strategies and recommendations to stay in good physical health and enhance quality of life, such as prevention strategies, having an advocate, securing a [place], and keeping a list of medications and system for tracking them. It also discusses how to avoid risk for bone loss.  Biology of Weight Control  Clinical staff conducted group or individual video education with verbal and written material and guidebook.  Patient learns that weight gain occurs because we consume more calories than we burn (eating more, moving less). Even if your body weight is  normal, you may have higher ratios of fat compared to muscle mass. Too much body fat puts you at increased risk for cardiovascular disease, heart attack, stroke, type 2 diabetes, and obesity-related cancers. In addition to exercise, following the [place] can help reduce your risk.  Decoding Lab Results  Clinical staff conducted group or individual video education with verbal and written material and guidebook.  Patient learns that lab test reflects one measurement whose values change over time and are influenced by many factors, including medication, stress, sleep, exercise, food, hydration, pre-existing medical conditions, and more. It is recommended to use the knowledge from this video to become more involved with your lab results and evaluate your numbers to speak with your doctor.   Diseases of Our Time - Overview  Clinical staff conducted group or individual video education with verbal and written material and guidebook.  Patient learns that according to the CDC, 50% to 70% of chronic diseases (such as obesity, type 2 diabetes, elevated lipids, hypertension, and heart disease) are avoidable through lifestyle improvements including healthier food choices, listening to satiety cues, and increased physical activity.  Sleep Disorders Clinical staff conducted group or individual video education with verbal and written material and guidebook.  Patient learns how good quality and duration of sleep are important to overall health and well-being. Patient also learns about sleep disorders and how they impact health along with recommendations to address them, including discussing with a physician.  Nutrition  Dining Out - Part 2 Clinical staff conducted group or individual video education with verbal and written material and guidebook.  Patient learns how to plan ahead and communicate in order to maximize their dining experience in a healthy and nutritious manner. Included are recommended  food choices based on the type of restaurant the patient is visiting.   Fueling a Best boy conducted group or individual video education with verbal and written material and guidebook.  There is a strong connection between our food choices and our health. Diseases like obesity and type 2 diabetes are very prevalent and are in large-part due to lifestyle choices. The Pritikin Eating Plan provides plenty of food and hunger-curbing satisfaction. It is easy to follow, affordable, and helps reduce health risks.  Menu Workshop  Clinical staff conducted group or individual video education with verbal and written material and guidebook.  Patient learns that restaurant meals can sabotage health goals because they are often packed with calories, fat, sodium, and sugar. Recommendations include strategies to plan ahead and to communicate with the manager, chef, or server to help order a healthier meal.  Planning Your Eating Strategy  Clinical staff conducted group or individual video education with verbal and written material and guidebook.  Patient learns about the [place] and its benefit of reducing the risk of disease. The [place] does not focus on calories. Instead, it emphasizes high-quality, nutrient-rich foods. By knowing the characteristics of the foods, we choose, we can determine their calorie density and make informed decisions.  Targeting Your Nutrition Priorities  Clinical staff conducted group or individual video education with verbal and written material and guidebook.  Patient learns that lifestyle habits have a tremendous impact on disease risk and progression. This video provides eating and physical activity recommendations based on your personal health goals, such as reducing LDL cholesterol, losing weight, preventing or controlling type 2 diabetes, and reducing high blood pressure.  Vitamins and Minerals  Clinical staff conducted group or  individual video education with verbal and written material and guidebook.  Patient learns different ways to obtain key vitamins and minerals, including through a recommended healthy diet. It is important to discuss all supplements you take with your doctor.   Healthy Mind-Set    Smoking Cessation  Clinical  staff conducted group or individual video education with verbal and written material and guidebook.  Patient learns that cigarette smoking and tobacco addiction pose a serious health risk which affects millions of people. Stopping smoking will significantly reduce the risk of heart disease, lung disease, and many forms of cancer. Recommended strategies for quitting are covered, including working with your doctor to develop a successful plan.  Culinary   Becoming a Financial trader conducted group or individual video education with verbal and written material and guidebook.  Patient learns that cooking at home can be healthy, cost-effective, quick, and puts them in control. Keys to cooking healthy recipes will include looking at your recipe, assessing your equipment needs, planning ahead, making it simple, choosing cost-effective seasonal ingredients, and limiting the use of added fats, salts, and sugars.  Cooking - Breakfast and Snacks  Clinical staff conducted group or individual video education with verbal and written material and guidebook.  Patient learns how important breakfast is to satiety and nutrition through the entire day. Recommendations include key foods to eat during breakfast to help stabilize blood sugar levels and to prevent overeating at meals later in the day. Planning ahead is also a key component.  Cooking - Human resources officer conducted group or individual video education with verbal and written material and guidebook.  Patient learns eating strategies to improve overall health, including an approach to cook more at home. Recommendations include  thinking of animal protein as a side on your plate rather than center stage and focusing instead on lower calorie dense options like vegetables, fruits, whole grains, and plant-based proteins, such as beans. Making sauces in large quantities to freeze for later and leaving the skin on your vegetables are also recommended to maximize your experience.  Cooking - Healthy Salads and Dressing Clinical staff conducted group or individual video education with verbal and written material and guidebook.  Patient learns that vegetables, fruits, whole grains, and legumes are the foundations of the [place]. Recommendations include how to incorporate each of these in flavorful and healthy salads, and how to create homemade salad dressings. Proper handling of ingredients is also covered. Cooking - Soups and [employer] - Soups and Desserts Clinical staff conducted group or individual video education with verbal and written material and guidebook.  Patient learns that Pritikin soups and desserts make for easy, nutritious, and delicious snacks and meal components that are low in sodium, fat, sugar, and calorie density, while high in vitamins, minerals, and filling fiber. Recommendations include simple and healthy ideas for soups and desserts.   Overview     The Pritikin Solution Program Overview Clinical staff conducted group or individual video education with verbal and written material and guidebook.  Patient learns that the results of the [place] [place] Program have been documented in more than 100 articles published in peer-reviewed journals, and the benefits include reducing risk factors for (and, in some cases, even reversing) high cholesterol, high blood pressure, type 2 diabetes, obesity, and more! An overview of the three key pillars of the Pritikin Program will be covered: eating well, doing regular exercise, and having a healthy mind-set.  WORKSHOPS  Exercise: Exercise Basics: Building Your  Action Plan Clinical staff led group instruction and group discussion with PowerPoint presentation and patient guidebook. To enhance the learning environment the use of posters, models and videos may be added. At the conclusion of this workshop, patients will comprehend the difference between physical activity and exercise,  as well as the benefits of incorporating both, into their routine. Patients will understand the FITT (Frequency, Intensity, Time, and Type) principle and how to use it to build an exercise action plan. In addition, safety concerns and other considerations for exercise and cardiac rehab will be addressed by the presenter. The purpose of this lesson is to promote a comprehensive and effective weekly exercise routine in order to improve patients' overall level of fitness.   Managing Heart Disease: Your Path to a Healthier Heart Clinical staff led group instruction and group discussion with PowerPoint presentation and patient guidebook. To enhance the learning environment the use of posters, models and videos may be added.At the conclusion of this workshop, patients will understand the anatomy and physiology of the heart. Additionally, they will understand how Pritikin's three pillars impact the risk factors, the progression, and the management of heart disease.  The purpose of this lesson is to provide a high-level overview of the heart, heart disease, and how the Pritikin lifestyle positively impacts risk factors.  Exercise Biomechanics Clinical staff led group instruction and group discussion with PowerPoint presentation and patient guidebook. To enhance the learning environment the use of posters, models and videos may be added. Patients will learn how the structural parts of their bodies function and how these functions impact their daily activities, movement, and exercise. Patients will learn how to promote a neutral spine, learn how to manage pain, and identify ways to  improve their physical movement in order to promote healthy living. The purpose of this lesson is to expose patients to common physical limitations that impact physical activity. Participants will learn practical ways to adapt and manage aches and pains, and to minimize their effect on regular exercise. Patients will learn how to maintain good posture while sitting, walking, and lifting.  Balance Training and Fall Prevention  Clinical staff led group instruction and group discussion with PowerPoint presentation and patient guidebook. To enhance the learning environment the use of posters, models and videos may be added. At the conclusion of this workshop, patients will understand the importance of their sensorimotor skills (vision, proprioception, and the vestibular system) in maintaining their ability to balance as they age. Patients will apply a variety of balancing exercises that are appropriate for their current level of function. Patients will understand the common causes for poor balance, possible solutions to these problems, and ways to modify their physical environment in order to minimize their fall risk. The purpose of this lesson is to teach patients about the importance of maintaining balance as they age and ways to minimize their risk of falling.  WORKSHOPS   Nutrition:  Fueling a Scientist, research (physical sciences) led group instruction and group discussion with PowerPoint presentation and patient guidebook. To enhance the learning environment the use of posters, models and videos may be added. Patients will review the foundational principles of the [place] and understand what constitutes a serving size in each of the food groups. Patients will also learn Pritikin-friendly foods that are better choices when away from home and review make-ahead meal and snack options. Calorie density will be reviewed and applied to three nutrition priorities: weight maintenance, weight loss, and  weight gain. The purpose of this lesson is to reinforce (in a group setting) the key concepts around what patients are recommended to eat and how to apply these guidelines when away from home by planning and selecting Pritikin-friendly options. Patients will understand how calorie density may be adjusted for different weight management  goals.  Mindful Eating  Clinical staff led group instruction and group discussion with PowerPoint presentation and patient guidebook. To enhance the learning environment the use of posters, models and videos may be added. Patients will briefly review the concepts of the [place] and the importance of low-calorie dense foods. The concept of mindful eating will be introduced as well as the importance of paying attention to internal hunger signals. Triggers for non-hunger eating and techniques for dealing with triggers will be explored. The purpose of this lesson is to provide patients with the opportunity to review the basic principles of the [place], discuss the value of eating mindfully and how to measure internal cues of hunger and fullness using the Hunger Scale. Patients will also discuss reasons for non-hunger eating and learn strategies to use for controlling emotional eating.  Targeting Your Nutrition Priorities Clinical staff led group instruction and group discussion with PowerPoint presentation and patient guidebook. To enhance the learning environment the use of posters, models and videos may be added. Patients will learn how to determine their genetic susceptibility to disease by reviewing their family history. Patients will gain insight into the importance of diet as part of an overall healthy lifestyle in mitigating the impact of genetics and other environmental insults. The purpose of this lesson is to provide patients with the opportunity to assess their personal nutrition priorities by looking at their family history, their own health  history and current risk factors. Patients will also be able to discuss ways of prioritizing and modifying the [place] for their highest risk areas  Menu  Clinical staff led group instruction and group discussion with PowerPoint presentation and patient guidebook. To enhance the learning environment the use of posters, models and videos may be added. Using menus brought in from [employer], or printed from [employer], patients will apply the [place] dining out guidelines that were presented in the [employer] video. Patients will also be able to practice these guidelines in a variety of provided scenarios. The purpose of this lesson is to provide patients with the opportunity to practice hands-on learning of the [place] with actual menus and practice scenarios.  Label Reading Clinical staff led group instruction and group discussion with PowerPoint presentation and patient guidebook. To enhance the learning environment the use of posters, models and videos may be added. Patients will review and discuss the Pritikin label reading guidelines presented in Pritikin's Label Reading Educational series video. Using fool labels brought in from local grocery stores and markets, patients will apply the label reading guidelines and determine if the packaged food meet the Pritikin guidelines. The purpose of this lesson is to provide patients with the opportunity to review, discuss, and practice hands-on learning of the Pritikin Label Reading guidelines with actual packaged food labels. [place] Workshops are designed to teach patients ways to prepare quick, simple, and affordable recipes at home. The importance of nutrition's role in chronic disease risk reduction is reflected in its emphasis in the overall Pritikin program. By learning how to prepare essential core Pritikin Eating Plan recipes, patients will increase  control over what they eat; be able to customize the flavor of foods without the use of added salt, sugar, or fat; and improve the quality of the food they consume. By learning a set of core recipes which are easily assembled, quickly prepared, and affordable, patients are more likely to prepare more healthy foods at  home. These workshops focus on convenient breakfasts, simple entres, side dishes, and desserts which can be prepared with minimal effort and are consistent with nutrition recommendations for cardiovascular risk reduction. Cooking [employer] are taught by a Engineer, materials (RD) who has been trained by the [employer]. The chef or RD has a clear understanding of the importance of minimizing - if not completely eliminating - added fat, sugar, and sodium in recipes. Throughout the series of [place] Workshop sessions, patients will learn about healthy ingredients and efficient methods of cooking to build confidence in their capability to prepare    Cooking School weekly topics:  Adding Flavor- Sodium-Free  Fast and Healthy Breakfasts  Powerhouse Plant-Based Proteins  Satisfying Salads and Dressings  Simple Sides and Sauces  International Cuisine-Spotlight on the [place] Zones  Delicious Desserts  Savory Soups  Efficiency Cooking - Meals in a Snap  Tasty Appetizers and Snacks  Comforting Weekend Breakfasts  One-Pot Wonders   Fast Evening Meals  Easy [place] (Psychosocial): New Thoughts, New Behaviors Clinical staff led group instruction and group discussion with PowerPoint presentation and patient guidebook. To enhance the learning environment the use of posters, models and videos may be added. Patients will learn and practice techniques for developing effective health and lifestyle goals. Patients will be able to effectively apply the goal setting process learned to develop at  least one new personal goal.  The purpose of this lesson is to expose patients to a new skill set of behavior modification techniques such as techniques setting SMART goals, overcoming barriers, and achieving new thoughts and new behaviors.  Managing Moods and Relationships Clinical staff led group instruction and group discussion with PowerPoint presentation and patient guidebook. To enhance the learning environment the use of posters, models and videos may be added. Patients will learn how emotional and chronic stress factors can impact their health and relationships. They will learn healthy ways to manage their moods and utilize positive coping mechanisms. In addition, ICR patients will learn ways to improve communication skills. The purpose of this lesson is to expose patients to ways of understanding how one's mood and health are intimately connected. Developing a healthy outlook can help build positive relationships and connections with others. Patients will understand the importance of utilizing effective communication skills that include actively listening and being heard. They will learn and understand the importance of the "4 Cs" and especially Connections in fostering of a Healthy Mind-Set.  Healthy Sleep for a Healthy Heart Clinical staff led group instruction and group discussion with PowerPoint presentation and patient guidebook. To enhance the learning environment the use of posters, models and videos may be added. At the conclusion of this workshop, patients will be able to demonstrate knowledge of the importance of sleep to overall health, well-being, and quality of life. They will understand the symptoms of, and treatments for, common sleep disorders. Patients will also be able to identify daytime and nighttime behaviors which impact sleep, and they will be able to apply these tools to help manage sleep-related challenges. The purpose of this lesson is to provide patients with a general  overview of sleep and outline the importance of quality sleep. Patients will learn about a few of the most common sleep disorders. Patients will also be introduced to the concept of "sleep hygiene," and discover ways to self-manage certain sleeping problems through simple daily behavior changes. Finally, the workshop will motivate  patients by clarifying the links between quality sleep and their goals of heart-healthy living.   Recognizing and Reducing Stress Clinical staff led group instruction and group discussion with PowerPoint presentation and patient guidebook. To enhance the learning environment the use of posters, models and videos may be added. At the conclusion of this workshop, patients will be able to understand the types of stress reactions, differentiate between acute and chronic stress, and recognize the impact that chronic stress has on their health. They will also be able to apply different coping mechanisms, such as reframing negative self-talk. Patients will have the opportunity to practice a variety of stress management techniques, such as deep abdominal breathing, progressive muscle relaxation, and/or guided imagery.  The purpose of this lesson is to educate patients on the role of stress in their lives and to provide healthy techniques for coping with it.  Learning Barriers/Preferences:  Learning Barriers/Preferences - 12/19/21 1328       Learning Barriers/Preferences   Learning Barriers Hearing;Sight   pt wears glasses   Learning Preferences Written Material;Skilled Demonstration;Individual Instruction;Group Instruction;Pictoral             Education Topics:  Knowledge Questionnaire Score:  Knowledge Questionnaire Score - 12/19/21 1329       Knowledge Questionnaire Score   Pre Score 21/24             Core Components/Risk Factors/Patient Goals at Admission:  Personal Goals and Risk Factors at Admission - 12/19/21 1048       Core Components/Risk  Factors/Patient Goals on Admission    Weight Management Weight Maintenance;Yes    Intervention Weight Management: Develop a combined nutrition and exercise program designed to reach desired caloric intake, while maintaining appropriate intake of nutrient and fiber, sodium and fats, and appropriate energy expenditure required for the weight goal.;Weight Management: Provide education and appropriate resources to help participant work on and attain dietary goals.    Admit Weight 178 lb 5.6 oz (80.9 kg)    Expected Outcomes Long Term: Adherence to nutrition and physical activity/exercise program aimed toward attainment of established weight goal;Weight Maintenance: Understanding of the daily nutrition guidelines, which includes 25-35% calories from fat, 7% or less cal from saturated fats, less than '200mg'$  cholesterol, less than 1.5gm of sodium, & 5 or more servings of fruits and vegetables daily;Understanding recommendations for meals to include 15-35% energy as protein, 25-35% energy from fat, 35-60% energy from carbohydrates, less than '200mg'$  of dietary cholesterol, 20-35 gm of total fiber daily    Hypertension Yes    Intervention Provide education on lifestyle modifcations including regular physical activity/exercise, weight management, moderate sodium restriction and increased consumption of fresh fruit, vegetables, and low fat dairy, alcohol moderation, and smoking cessation.;Monitor prescription use compliance.    Expected Outcomes Short Term: Continued assessment and intervention until BP is < 140/35m HG in hypertensive participants. < 130/862mHG in hypertensive participants with diabetes, heart failure or chronic kidney disease.;Long Term: Maintenance of blood pressure at goal levels.    Lipids Yes    Intervention Provide education and support for participant on nutrition & aerobic/resistive exercise along with prescribed medications to achieve LDL '70mg'$ , HDL >'40mg'$ .    Expected Outcomes Short Term:  Participant states understanding of desired cholesterol values and is compliant with medications prescribed. Participant is following exercise prescription and nutrition guidelines.;Long Term: Cholesterol controlled with medications as prescribed, with individualized exercise RX and with personalized nutrition plan. Value goals: LDL < '70mg'$ , HDL > 40 mg.    Personal Goal  Camara desires to return playing pickball at the [facility] to benefit him physically as well as mental well being    Intervention Provide support and encouragement to increase physical activity.  Strongly encourage consistent home exercise and routine.    Expected Outcomes Mihir will report to cardiac rehab staff he has return to playing pickleball with no complaints or issues             Core Components/Risk Factors/Patient Goals Review:   Goals and Risk Factor Review     Row Name 12/23/21 1723             Core Components/Risk Factors/Patient Goals Review   Personal Goals Review Weight Management/Obesity;Hypertension;Lipids       Review Phillip Allen started intensive cardiac rehab on 12/23/21. Phillip Allen did well with exercise       Expected Outcomes Phillip Allen will continue to participate in intensive cardiac rehab for exercise, nutrition and lifestyle modifications                Core Components/Risk Factors/Patient Goals at Discharge (Final Review):   Goals and Risk Factor Review - 12/23/21 1723       Core Components/Risk Factors/Patient Goals Review   Personal Goals Review Weight Management/Obesity;Hypertension;Lipids    Review Phillip Allen started intensive cardiac rehab on 12/23/21. Phillip Allen did well with exercise    Expected Outcomes Phillip Allen will continue to participate in intensive cardiac rehab for exercise, nutrition and lifestyle modifications             ITP Comments:  ITP Comments     Row Name 12/19/21 1129 12/23/21 1720         ITP Comments Dr. Radford Pax Medical Director Introduction to  [facility] education Program/Intensive Cardiac  Rehab.  Initial [facility] Orientation packet and video reviewed with pt 30 Day ITP Review. Phillip Allen started ICR on 12/23/21 and did well with exercise               Comments: See ITP comments.Harrell Gave RN BSN

## 2021-12-27 ENCOUNTER — Encounter (HOSPITAL_COMMUNITY)
Admission: RE | Admit: 2021-12-27 | Discharge: 2021-12-27 | Disposition: A | Discharge status: Home / Self Care | Payer: MEDICARE | Source: Ambulatory Visit | Attending: Cardiology | Admitting: Cardiology

## 2021-12-27 DIAGNOSIS — I214 Non-ST elevation (NSTEMI) myocardial infarction: Secondary | ICD-10-CM

## 2021-12-27 DIAGNOSIS — I252 Old myocardial infarction: Secondary | ICD-10-CM | POA: Diagnosis not present

## 2021-12-27 NOTE — Progress Notes (Signed)
QUALITY OF LIFE SCORE REVIEW Pt completed Quality of Life survey as a participant in Cardiac Rehab. Scores 19.0 or below are considered low. Overall 22.05, Health and Function 22.13, socioeconomic 25.00, physiological and spiritual 18.33, family 22.10. Patient quality of life slightly altered by physical constraints which limits ability to perform as prior to recent cardiac illness. Patient states he "hasn't been the same since his wife died"; states he "has started dating a woman and is excited about that". Patient states he is optimistic about CR program and has been "impressed so far." Offered emotional support and reassurance. Will continue to monitor and intervene as necessary.   Albertine Grates RN 12/27/2021 1:53 PM

## 2021-12-30 ENCOUNTER — Encounter (HOSPITAL_COMMUNITY)
Admission: RE | Admit: 2021-12-30 | Discharge: 2021-12-30 | Disposition: A | Discharge status: Home / Self Care | Payer: MEDICARE | Source: Ambulatory Visit | Attending: Cardiology | Admitting: Cardiology

## 2021-12-30 DIAGNOSIS — I214 Non-ST elevation (NSTEMI) myocardial infarction: Secondary | ICD-10-CM

## 2021-12-30 DIAGNOSIS — I252 Old myocardial infarction: Secondary | ICD-10-CM | POA: Diagnosis not present

## 2022-01-01 ENCOUNTER — Encounter (HOSPITAL_COMMUNITY)
Admission: RE | Admit: 2022-01-01 | Discharge: 2022-01-01 | Disposition: A | Discharge status: Home / Self Care | Payer: MEDICARE | Source: Ambulatory Visit | Attending: Cardiology | Admitting: Cardiology

## 2022-01-01 DIAGNOSIS — I252 Old myocardial infarction: Secondary | ICD-10-CM | POA: Diagnosis not present

## 2022-01-01 DIAGNOSIS — I214 Non-ST elevation (NSTEMI) myocardial infarction: Secondary | ICD-10-CM

## 2022-01-03 ENCOUNTER — Encounter (HOSPITAL_COMMUNITY)
Admission: RE | Admit: 2022-01-03 | Discharge: 2022-01-03 | Disposition: A | Discharge status: Home / Self Care | Payer: MEDICARE | Source: Ambulatory Visit | Attending: Cardiology | Admitting: Cardiology

## 2022-01-03 DIAGNOSIS — Z950 Presence of cardiac pacemaker: Secondary | ICD-10-CM | POA: Diagnosis not present

## 2022-01-03 DIAGNOSIS — I214 Non-ST elevation (NSTEMI) myocardial infarction: Secondary | ICD-10-CM | POA: Diagnosis not present

## 2022-01-03 DIAGNOSIS — F17210 Nicotine dependence, cigarettes, uncomplicated: Secondary | ICD-10-CM | POA: Diagnosis not present

## 2022-01-03 DIAGNOSIS — I10 Essential (primary) hypertension: Secondary | ICD-10-CM | POA: Insufficient documentation

## 2022-01-03 DIAGNOSIS — I441 Atrioventricular block, second degree: Secondary | ICD-10-CM | POA: Diagnosis not present

## 2022-01-03 DIAGNOSIS — I252 Old myocardial infarction: Secondary | ICD-10-CM | POA: Diagnosis not present

## 2022-01-03 DIAGNOSIS — R55 Syncope and collapse: Secondary | ICD-10-CM | POA: Diagnosis not present

## 2022-01-03 DIAGNOSIS — I2510 Atherosclerotic heart disease of native coronary artery without angina pectoris: Secondary | ICD-10-CM | POA: Insufficient documentation

## 2022-01-08 ENCOUNTER — Encounter (HOSPITAL_COMMUNITY)
Admission: RE | Admit: 2022-01-08 | Discharge: 2022-01-08 | Disposition: A | Discharge status: Home / Self Care | Payer: MEDICARE | Source: Ambulatory Visit | Attending: Cardiology | Admitting: Cardiology

## 2022-01-08 DIAGNOSIS — I252 Old myocardial infarction: Secondary | ICD-10-CM | POA: Diagnosis not present

## 2022-01-08 DIAGNOSIS — F4323 Adjustment disorder with mixed anxiety and depressed mood: Secondary | ICD-10-CM | POA: Diagnosis not present

## 2022-01-08 DIAGNOSIS — I214 Non-ST elevation (NSTEMI) myocardial infarction: Secondary | ICD-10-CM | POA: Diagnosis not present

## 2022-01-08 DIAGNOSIS — I2510 Atherosclerotic heart disease of native coronary artery without angina pectoris: Secondary | ICD-10-CM | POA: Diagnosis not present

## 2022-01-08 DIAGNOSIS — I441 Atrioventricular block, second degree: Secondary | ICD-10-CM | POA: Diagnosis not present

## 2022-01-08 DIAGNOSIS — Z950 Presence of cardiac pacemaker: Secondary | ICD-10-CM | POA: Diagnosis not present

## 2022-01-08 DIAGNOSIS — I10 Essential (primary) hypertension: Secondary | ICD-10-CM | POA: Diagnosis not present

## 2022-01-08 NOTE — Progress Notes (Signed)
CARDIAC REHAB PHASE 2  Reviewed home exercise with pt today. Pt is tolerating exercise well. Pt will continue to exercise on his own by walking on the treadmill and pickleball for 30 minutes per session 2-4 days a week in addition to the 3 days in CRP2. Advised pt on THRR, RPE scale, hydration and temperature/humidity precautions. Reinforced NTG use, S/S to stop exercise and when to call MD vs 911. Encouraged warm up cool down and stretches with exercise sessions. Pt verbalized understanding, all questions were answered and pt was given a copy to take home.    Kirby Funk ACSM-CEP 01/08/2022 4:18 PM

## 2022-01-10 ENCOUNTER — Encounter (HOSPITAL_COMMUNITY)
Admission: RE | Admit: 2022-01-10 | Discharge: 2022-01-10 | Disposition: A | Discharge status: Home / Self Care | Payer: MEDICARE | Source: Ambulatory Visit | Attending: Cardiology | Admitting: Cardiology

## 2022-01-10 DIAGNOSIS — I2510 Atherosclerotic heart disease of native coronary artery without angina pectoris: Secondary | ICD-10-CM | POA: Diagnosis not present

## 2022-01-10 DIAGNOSIS — I441 Atrioventricular block, second degree: Secondary | ICD-10-CM | POA: Diagnosis not present

## 2022-01-10 DIAGNOSIS — I10 Essential (primary) hypertension: Secondary | ICD-10-CM | POA: Diagnosis not present

## 2022-01-10 DIAGNOSIS — I214 Non-ST elevation (NSTEMI) myocardial infarction: Secondary | ICD-10-CM | POA: Diagnosis not present

## 2022-01-10 DIAGNOSIS — I252 Old myocardial infarction: Secondary | ICD-10-CM | POA: Diagnosis not present

## 2022-01-10 DIAGNOSIS — Z950 Presence of cardiac pacemaker: Secondary | ICD-10-CM | POA: Diagnosis not present

## 2022-01-13 ENCOUNTER — Encounter (HOSPITAL_COMMUNITY)
Admission: RE | Admit: 2022-01-13 | Discharge: 2022-01-13 | Disposition: A | Discharge status: Home / Self Care | Payer: MEDICARE | Source: Ambulatory Visit | Attending: Cardiology | Admitting: Cardiology

## 2022-01-13 DIAGNOSIS — Z85828 Personal history of other malignant neoplasm of skin: Secondary | ICD-10-CM | POA: Diagnosis not present

## 2022-01-13 DIAGNOSIS — L814 Other melanin hyperpigmentation: Secondary | ICD-10-CM | POA: Diagnosis not present

## 2022-01-13 DIAGNOSIS — I10 Essential (primary) hypertension: Secondary | ICD-10-CM | POA: Diagnosis not present

## 2022-01-13 DIAGNOSIS — I441 Atrioventricular block, second degree: Secondary | ICD-10-CM | POA: Diagnosis not present

## 2022-01-13 DIAGNOSIS — I214 Non-ST elevation (NSTEMI) myocardial infarction: Secondary | ICD-10-CM

## 2022-01-13 DIAGNOSIS — I252 Old myocardial infarction: Secondary | ICD-10-CM | POA: Diagnosis not present

## 2022-01-13 DIAGNOSIS — Z950 Presence of cardiac pacemaker: Secondary | ICD-10-CM | POA: Diagnosis not present

## 2022-01-13 DIAGNOSIS — L570 Actinic keratosis: Secondary | ICD-10-CM | POA: Diagnosis not present

## 2022-01-13 DIAGNOSIS — I2510 Atherosclerotic heart disease of native coronary artery without angina pectoris: Secondary | ICD-10-CM | POA: Diagnosis not present

## 2022-01-14 DIAGNOSIS — E782 Mixed hyperlipidemia: Secondary | ICD-10-CM | POA: Diagnosis not present

## 2022-01-14 DIAGNOSIS — I10 Essential (primary) hypertension: Secondary | ICD-10-CM | POA: Diagnosis not present

## 2022-01-14 DIAGNOSIS — E118 Type 2 diabetes mellitus with unspecified complications: Secondary | ICD-10-CM | POA: Diagnosis not present

## 2022-01-15 ENCOUNTER — Encounter (HOSPITAL_COMMUNITY)
Admission: RE | Admit: 2022-01-15 | Discharge: 2022-01-15 | Disposition: A | Discharge status: Home / Self Care | Payer: MEDICARE | Source: Ambulatory Visit | Attending: Cardiology | Admitting: Cardiology

## 2022-01-15 DIAGNOSIS — I2510 Atherosclerotic heart disease of native coronary artery without angina pectoris: Secondary | ICD-10-CM | POA: Diagnosis not present

## 2022-01-15 DIAGNOSIS — I252 Old myocardial infarction: Secondary | ICD-10-CM | POA: Diagnosis not present

## 2022-01-15 DIAGNOSIS — I214 Non-ST elevation (NSTEMI) myocardial infarction: Secondary | ICD-10-CM | POA: Diagnosis not present

## 2022-01-15 DIAGNOSIS — I441 Atrioventricular block, second degree: Secondary | ICD-10-CM | POA: Diagnosis not present

## 2022-01-15 DIAGNOSIS — Z950 Presence of cardiac pacemaker: Secondary | ICD-10-CM | POA: Diagnosis not present

## 2022-01-15 DIAGNOSIS — I10 Essential (primary) hypertension: Secondary | ICD-10-CM | POA: Diagnosis not present

## 2022-01-17 ENCOUNTER — Encounter (HOSPITAL_COMMUNITY)
Admission: RE | Admit: 2022-01-17 | Discharge: 2022-01-17 | Disposition: A | Discharge status: Home / Self Care | Payer: MEDICARE | Source: Ambulatory Visit | Attending: Cardiology | Admitting: Cardiology

## 2022-01-17 DIAGNOSIS — I10 Essential (primary) hypertension: Secondary | ICD-10-CM | POA: Diagnosis not present

## 2022-01-17 DIAGNOSIS — I214 Non-ST elevation (NSTEMI) myocardial infarction: Secondary | ICD-10-CM

## 2022-01-17 DIAGNOSIS — I441 Atrioventricular block, second degree: Secondary | ICD-10-CM | POA: Diagnosis not present

## 2022-01-17 DIAGNOSIS — I252 Old myocardial infarction: Secondary | ICD-10-CM | POA: Diagnosis not present

## 2022-01-17 DIAGNOSIS — I2510 Atherosclerotic heart disease of native coronary artery without angina pectoris: Secondary | ICD-10-CM | POA: Diagnosis not present

## 2022-01-17 DIAGNOSIS — Z950 Presence of cardiac pacemaker: Secondary | ICD-10-CM | POA: Diagnosis not present

## 2022-01-20 ENCOUNTER — Encounter (HOSPITAL_COMMUNITY)
Admission: RE | Admit: 2022-01-20 | Discharge: 2022-01-20 | Disposition: A | Discharge status: Home / Self Care | Payer: MEDICARE | Source: Ambulatory Visit | Attending: Cardiology | Admitting: Cardiology

## 2022-01-20 ENCOUNTER — Ambulatory Visit (INDEPENDENT_AMBULATORY_CARE_PROVIDER_SITE_OTHER): Payer: MEDICARE

## 2022-01-20 DIAGNOSIS — I441 Atrioventricular block, second degree: Secondary | ICD-10-CM | POA: Diagnosis not present

## 2022-01-20 DIAGNOSIS — I10 Essential (primary) hypertension: Secondary | ICD-10-CM | POA: Diagnosis not present

## 2022-01-20 DIAGNOSIS — I252 Old myocardial infarction: Secondary | ICD-10-CM | POA: Diagnosis not present

## 2022-01-20 DIAGNOSIS — Z950 Presence of cardiac pacemaker: Secondary | ICD-10-CM | POA: Diagnosis not present

## 2022-01-20 DIAGNOSIS — I2510 Atherosclerotic heart disease of native coronary artery without angina pectoris: Secondary | ICD-10-CM | POA: Diagnosis not present

## 2022-01-20 DIAGNOSIS — I214 Non-ST elevation (NSTEMI) myocardial infarction: Secondary | ICD-10-CM | POA: Diagnosis not present

## 2022-01-21 DIAGNOSIS — J986 Disorders of diaphragm: Secondary | ICD-10-CM | POA: Diagnosis not present

## 2022-01-21 DIAGNOSIS — J984 Other disorders of lung: Secondary | ICD-10-CM | POA: Diagnosis not present

## 2022-01-21 LAB — CUP PACEART REMOTE DEVICE CHECK
Battery Remaining Longevity: 132 mo
Battery Voltage: 3.18 V
Brady Statistic AP VP Percent: 6.7 %
Brady Statistic AP VS Percent: 0 %
Brady Statistic AS VP Percent: 93.23 %
Brady Statistic AS VS Percent: 0.07 %
Brady Statistic RA Percent Paced: 6.72 %
Brady Statistic RV Percent Paced: 99.93 %
Date Time Interrogation Session: 20230918025841
Implantable Lead Implant Date: 20230615
Implantable Lead Implant Date: 20230615
Implantable Lead Location: 753859
Implantable Lead Location: 753860
Implantable Lead Model: 3830
Implantable Lead Model: 5076
Implantable Pulse Generator Implant Date: 20230615
Lead Channel Impedance Value: 323 Ohm
Lead Channel Impedance Value: 361 Ohm
Lead Channel Impedance Value: 456 Ohm
Lead Channel Impedance Value: 475 Ohm
Lead Channel Pacing Threshold Amplitude: 0.625 V
Lead Channel Pacing Threshold Amplitude: 1 V
Lead Channel Pacing Threshold Pulse Width: 0.4 ms
Lead Channel Pacing Threshold Pulse Width: 0.4 ms
Lead Channel Sensing Intrinsic Amplitude: 3 mV
Lead Channel Sensing Intrinsic Amplitude: 3 mV
Lead Channel Setting Pacing Amplitude: 2.75 V
Lead Channel Setting Pacing Amplitude: 2.75 V
Lead Channel Setting Pacing Pulse Width: 0.4 ms
Lead Channel Setting Sensing Sensitivity: 2 mV

## 2022-01-22 ENCOUNTER — Encounter: Payer: Self-pay | Admitting: Internal Medicine

## 2022-01-22 ENCOUNTER — Encounter (INDEPENDENT_AMBULATORY_CARE_PROVIDER_SITE_OTHER): Payer: MEDICARE | Admitting: Internal Medicine

## 2022-01-22 ENCOUNTER — Encounter (HOSPITAL_COMMUNITY)
Admission: RE | Admit: 2022-01-22 | Discharge: 2022-01-22 | Disposition: A | Discharge status: Home / Self Care | Payer: MEDICARE | Source: Ambulatory Visit | Attending: Cardiology | Admitting: Cardiology

## 2022-01-22 VITALS — BP 124/68 | HR 57 | Resp 97 | Ht 68.0 in | Wt 178.4 lb

## 2022-01-22 DIAGNOSIS — I2510 Atherosclerotic heart disease of native coronary artery without angina pectoris: Secondary | ICD-10-CM | POA: Diagnosis not present

## 2022-01-22 DIAGNOSIS — I10 Essential (primary) hypertension: Secondary | ICD-10-CM

## 2022-01-22 DIAGNOSIS — F4323 Adjustment disorder with mixed anxiety and depressed mood: Secondary | ICD-10-CM | POA: Diagnosis not present

## 2022-01-22 DIAGNOSIS — Z950 Presence of cardiac pacemaker: Secondary | ICD-10-CM

## 2022-01-22 DIAGNOSIS — I441 Atrioventricular block, second degree: Secondary | ICD-10-CM | POA: Diagnosis not present

## 2022-01-22 DIAGNOSIS — I214 Non-ST elevation (NSTEMI) myocardial infarction: Secondary | ICD-10-CM

## 2022-01-22 DIAGNOSIS — I252 Old myocardial infarction: Secondary | ICD-10-CM | POA: Diagnosis not present

## 2022-01-22 NOTE — Progress Notes (Signed)
HPI Mr. Phillip Allen returns today for followup. 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 In the interim he notes feeling very well with no chest pain, sob, edema or syncope. He had not trouble with wound healing.  Allergies  Allergen Reactions   Penicillins Hives     Current Outpatient Medications  Medication Sig Dispense Refill   aspirin EC 81 MG tablet Take 1 tablet (81 mg total) by mouth daily. 30 tablet 3   Collagen-Boron-Hyaluronic Acid (MOVE FREE ULTRA JOINT HEALTH PO) Take 1 tablet by mouth daily.     diclofenac Sodium (VOLTAREN) 1 % GEL Apply 1 application  topically 4 (four) times daily as needed (pain).     docusate sodium (COLACE) 100 MG capsule Take 100 mg by mouth 2 (two) times daily.     FARXIGA 5 MG TABS tablet Take 5 mg by mouth every morning.     Krill Oil 500 MG CAPS Take 500 mg by mouth daily.     losartan (COZAAR) 25 MG tablet Take 12.5 mg by mouth daily.     meloxicam (MOBIC) 7.5 MG tablet Oral for 30     metoprolol succinate (TOPROL XL) 25 MG 24 hr tablet Take 1 tablet (25 mg total) by mouth daily. 30 tablet 0   Multiple Vitamins-Minerals (MULTIVITAMIN MEN 50+ PO) Take 1 tablet by mouth daily.     nitroGLYCERIN (NITROSTAT) 0.4 MG SL tablet Place 1 tablet (0.4 mg total) under the tongue every 5 (five) minutes as needed for chest pain. 30 tablet 3   Polyvinyl Alcohol-Povidone (REFRESH OP) Place 1 drop into both eyes daily as needed (dry eyes).     rosuvastatin (CRESTOR) 40 MG tablet Take 40 mg by mouth daily.     vortioxetine HBr (TRINTELLIX) 20 MG TABS tablet Take 20 mg by mouth daily.     No current facility-administered medications for this visit.     Past Medical History:  Diagnosis Date   Depression    Hyperlipidemia     ROS:   All systems reviewed and negative except as noted in the HPI.   Past Surgical History:  Procedure Laterality Date   CORONARY STENT  INTERVENTION N/A 10/16/2021   Procedure: CORONARY STENT INTERVENTION;  Surgeon: Phillip Mormon, MD;  Location: [place] [facility];  Service: Cardiovascular;  Laterality: N/A;   LEFT HEART CATH AND CORONARY ANGIOGRAPHY N/A 10/16/2021   Procedure: LEFT HEART CATH AND CORONARY ANGIOGRAPHY;  Surgeon: Phillip Mormon, MD;  Location: [place] [facility];  Service: Cardiovascular;  Laterality: N/A;   PACEMAKER IMPLANT N/A 10/17/2021   Procedure: PACEMAKER IMPLANT;  Surgeon: Phillip Lance, MD;  Location: [place] [facility];  Service: Cardiovascular;  Laterality: N/A;   SHOULDER SURGERY Right 06/2017   TEMPORARY PACEMAKER N/A 10/16/2021   Procedure: TEMPORARY PACEMAKER;  Surgeon: Phillip Mormon, MD;  Location: [place] [facility];  Service: Cardiovascular;  Laterality: N/A;     Family History  Problem Relation Age of Onset   Breast cancer Mother    Stomach cancer Mother    Lung cancer Father    Heart attack Brother      Social History   Socioeconomic History   Marital status: Widowed    Spouse name: Not on file   Number of children: 2   Years of education: Not on file   Highest education level: Not  on file  Occupational History   Not on file  Tobacco Use   Smoking status: Former    Packs/day: 0.25    Years: 10.00    Total pack years: 2.50    Types: Cigarettes    Quit date: 48    Years since quitting: 27.7   Smokeless tobacco: Never   Tobacco comments:    on/off since age 64  Vaping Use   Vaping Use: Never used  Substance and Sexual Activity   Alcohol use: Not Currently    Comment: has not drank any alcohol since Oct 2021   Drug use: Never   Sexual activity: Not on file  Other Topics Concern   Not on file  Social History Narrative   Not on file   Social Determinants of Health   Financial Resource Strain: Not on file  Food Insecurity: Not on file  Transportation Needs: Not on file  Physical Activity: Not on file  Stress: Not on file  Social  Connections: Not on file  Intimate Partner Violence: Not on file     BP 124/68   Pulse (!) 57   Resp (!) 97   Ht '5\' 8"'$  (1.727 m)   Wt 178 lb 6.4 oz (80.9 kg)   BMI 27.13 kg/m   Physical Exam:  Well appearing NAD HEENT: Unremarkable Neck:  No JVD, no thyromegally Lymphatics:  No adenopathy Back:  No CVA tenderness Lungs:  Clear with no wheezes. Well healed PPM incision HEART:  Regular rate rhythm, no murmurs, no rubs, no clicks Abd:  soft, positive bowel sounds, no organomegally, no rebound, no guarding Ext:  2 plus pulses, no edema, no cyanosis, no clubbing Skin:  No rashes no nodules Neuro:  CN II through XII intact, motor grossly intact  EKG - nsr with ventricular pacing  DEVICE  Normal device function.  See PaceArt for details.   Assess/Plan:  CHB - he is asymptomatic s/p PPM insertion. No change in his meds. PPM - his medtronic DDD PM is working normally.  CAD - he denies anginal symptoms and has no limit to physical activity.  Phillip Overlie [name],MD

## 2022-01-22 NOTE — Patient Instructions (Signed)
Medication Instructions:  Your physician recommends that you continue on your current medications as directed. Please refer to the Current Medication list given to you today.  *If you need a refill on your cardiac medications before your next appointment, please call your pharmacy*  Lab Work: None ordered.  If you have labs (blood work) drawn today and your tests are completely normal, you will receive your results only by: MyChart Message (if you have MyChart) OR A paper copy in the mail If you have any lab test that is abnormal or we need to change your treatment, we will call you to review the results.  Testing/Procedures: None ordered.  Follow-Up: At [facility], you and your health needs are our priority.  As part of our continuing mission to provide you with exceptional heart care, we have created designated Provider Care Teams.  These Care Teams include your primary Cardiologist (physician) and Advanced Practice Providers (APPs -  Physician Assistants and Nurse Practitioners) who all work together to provide you with the care you need, when you need it.  We recommend signing up for the patient portal called "MyChart".  Sign up information is provided on this After Visit Summary.  MyChart is used to connect with patients for Virtual Visits (Telemedicine).  Patients are able to view lab/test results, encounter notes, upcoming appointments, etc.  Non-urgent messages can be sent to your provider as well.   To learn more about what you can do with MyChart, go to [email or web address].    Your next appointment:   1 year(s)  The format for your next appointment:   In Person  Provider:   Gregg Taylor, MD{or one of the following Advanced Practice Providers on your designated Care Team:   Renee Ursuy, PA-C Michael "Andy" Tillery, PA-C  Remote monitoring is used to monitor your Pacemaker from home. This monitoring reduces the number of office visits required to check your device to  one time per year. It allows us to keep an eye on the functioning of your device to ensure it is working properly. You are scheduled for a device check from home on 04/21/22. You may send your transmission at any time that day. If you have a wireless device, the transmission will be sent automatically. After your physician reviews your transmission, you will receive a postcard with your next transmission date.  Important Information About Sugar       

## 2022-01-22 NOTE — Progress Notes (Signed)
Cardiac Individual Treatment Plan  Patient Details  Name: Phillip Allen MRN: [id number] Date of Birth: [birth year] Referring Provider:   Flowsheet Row INTENSIVE CARDIAC REHAB ORIENT from 12/19/2021 in [place]  Referring Provider Dr Vernell Leep, MD       Initial Encounter Date:  [place] from 12/19/2021 in [place]  Date 12/19/21       Visit Diagnosis: NSTEMI (non-ST elevated myocardial infarction) [facility])  Patient's Home Medications on Admission:  Current Outpatient Medications:    amLODipine (NORVASC) 5 MG tablet, Take 1 tablet (5 mg total) by mouth daily. (Patient not taking: Reported on 12/17/2021), Disp: 90 tablet, Rfl: 3   aspirin EC 81 MG tablet, Take 1 tablet (81 mg total) by mouth daily., Disp: 30 tablet, Rfl: 3   Collagen-Boron-Hyaluronic Acid (MOVE FREE ULTRA JOINT HEALTH PO), Take 1 tablet by mouth daily., Disp: , Rfl:    diclofenac Sodium (VOLTAREN) 1 % GEL, Apply 1 application  topically 4 (four) times daily as needed (pain)., Disp: , Rfl:    docusate sodium (COLACE) 100 MG capsule, Take 100 mg by mouth 2 (two) times daily., Disp: , Rfl:    FARXIGA 5 MG TABS tablet, Take 5 mg by mouth every morning., Disp: , Rfl:    Krill Oil 500 MG CAPS, Take 500 mg by mouth daily., Disp: , Rfl:    losartan (COZAAR) 50 MG tablet, TAKE 1 TABLET (50 MG TOTAL) BY MOUTH DAILY. (Patient taking differently: Take 25 mg by mouth daily.), Disp: 90 tablet, Rfl: 1   metoprolol succinate (TOPROL XL) 25 MG 24 hr tablet, Take 1 tablet (25 mg total) by mouth daily., Disp: 30 tablet, Rfl: 0   Multiple Vitamins-Minerals (MULTIVITAMIN MEN 50+ PO), Take 1 tablet by mouth daily., Disp: , Rfl:    nitroGLYCERIN (NITROSTAT) 0.4 MG SL tablet, Place 1 tablet (0.4 mg total) under the tongue every 5 (five) minutes as needed for chest pain., Disp: 30 tablet, Rfl: 3   Polyvinyl Alcohol-Povidone (REFRESH OP),  Place 1 drop into both eyes daily as needed (dry eyes)., Disp: , Rfl:    rosuvastatin (CRESTOR) 40 MG tablet, Take 40 mg by mouth daily., Disp: , Rfl:    vortioxetine HBr (TRINTELLIX) 20 MG TABS tablet, Take 20 mg by mouth daily., Disp: , Rfl:   Past Medical History: Past Medical History:  Diagnosis Date   Depression    Hyperlipidemia     Tobacco Use: Social History   Tobacco Use  Smoking Status Former   Packs/day: 0.25   Years: 10.00   Total pack years: 2.50   Types: Cigarettes   Quit date: 1996   Years since quitting: 27.7  Smokeless Tobacco Never  Tobacco Comments   on/off since age 17    Labs: Review Flowsheet        No data to display          Capillary Blood Glucose: No results found for: "GLUCAP"   Exercise Target Goals: Exercise Program Goal: Individual exercise prescription set using results from initial 6 min walk test and THRR while considering  patient's activity barriers and safety.   Exercise Prescription Goal: Initial exercise prescription builds to 30-45 minutes a day of aerobic activity, 2-3 days per week.  Home exercise guidelines will be given to patient during program as part of exercise prescription that the participant will acknowledge.  Activity Barriers & Risk Stratification:  Activity Barriers & Cardiac Risk Stratification -  12/19/21 1321       Activity Barriers & Cardiac Risk Stratification   Activity Barriers Back Problems;Joint Problems    Cardiac Risk Stratification High             6 Minute Walk:  6 Minute Walk     Row Name 12/19/21 1319         6 Minute Walk   Phase Initial     Distance 1511 feet     Walk Time 6 minutes     # of Rest Breaks 0     MPH 2.86     METS 2.56     RPE 9     Perceived Dyspnea  0     VO2 Peak 8.94     Symptoms No     Resting HR 56 bpm     Resting BP 107/58     Resting Oxygen Saturation  97 %     Exercise Oxygen Saturation  during 6 min walk 98 %     Max Ex. HR 76 bpm     Max Ex.  BP 126/58     2 Minute Post BP 122/62              Oxygen Initial Assessment:   Oxygen Re-Evaluation:   Oxygen Discharge (Final Oxygen Re-Evaluation):   Initial Exercise Prescription:  Initial Exercise Prescription - 12/19/21 1300       Date of Initial Exercise RX and Referring Provider   Date 12/19/21    Referring Provider Dr Vernell Leep, MD    Expected Discharge Date 02/21/22      Recumbant Bike   Level 2    RPM 60    Minutes 15    METs 2.3      NuStep   Level 2    SPM 80    Minutes 15    METs 2.3      Prescription Details   Frequency (times per week) 3    Duration Progress to 30 minutes of continuous aerobic without signs/symptoms of physical distress      Intensity   THRR 40-80% of Max Heartrate 56-113    Ratings of Perceived Exertion 11-13    Perceived Dyspnea 0-4      Progression   Progression Continue progressive overload as per policy without signs/symptoms or physical distress.      Resistance Training   Training Prescription Yes    Weight 3    Reps 10-15             Perform Capillary Blood Glucose checks as needed.  Exercise Prescription Changes:   Exercise Prescription Changes     Row Name 12/23/21 1457 01/08/22 1621           Response to Exercise   Blood Pressure (Admit) 122/64 128/60      Blood Pressure (Exercise) 144/72 128/68      Blood Pressure (Exit) 118/70 102/62      Heart Rate (Admit) 64 bpm 58 bpm      Heart Rate (Exercise) 92 bpm 113 bpm      Heart Rate (Exit) 63 bpm 66 bpm      Rating of Perceived Exertion (Exercise) 11 11.5      Symptoms None None      Comments Off to a great start with exercise. Reviewed MET's, goals and home ExRx      Duration Continue with 30 min of aerobic exercise without signs/symptoms of physical distress. Continue with 30 min of aerobic  exercise without signs/symptoms of physical distress.      Intensity THRR unchanged THRR unchanged        Progression   Progression Continue  to progress workloads to maintain intensity without signs/symptoms of physical distress. Continue to progress workloads to maintain intensity without signs/symptoms of physical distress.      Average METs 2.6 3.75        Resistance Training   Training Prescription Yes No      Weight 3 --      Reps 10-15 --      Time 10 Minutes --        Interval Training   Interval Training No No        Recumbant Bike   Level 2 3      RPM 60 60      Minutes 15 15      METs 2.8 3.4        NuStep   Level 2 5      SPM 96 96      Minutes 15 15      METs 2.5 4.1        Home Exercise Plan   Plans to continue exercise at -- [facility] (comment)      Frequency -- Add 2 additional days to program exercise sessions.      Initial Home Exercises Provided -- 01/08/22               Exercise Comments:   Exercise Comments     Row Name 12/23/21 1559 01/08/22 1636         Exercise Comments Patient tolerated 1st session of exercise well without symptoms. Reviewed MET's, goals and home ExRx. Pt tolerated exercise well with an average MET level of 3.75. Pt is currently meeting ACSM guidlines 5-7 days of 30 mins or more of exercise. Pt is playing pickleball and walking on the treadmill. Pt feels good about his goals of getting back to pickleball and gaining flexibility and stamina.               Exercise Goals and Review:   Exercise Goals     Row Name 12/19/21 1325             Exercise Goals   Increase Physical Activity Yes       Intervention Provide advice, education, support and counseling about physical activity/exercise needs.;Develop an individualized exercise prescription for aerobic and resistive training based on initial evaluation findings, risk stratification, comorbidities and participant's personal goals.       Expected Outcomes Short Term: Attend rehab on a regular basis to increase amount of physical activity.;Long Term: Add in home exercise to make exercise part of  routine and to increase amount of physical activity.;Long Term: Exercising regularly at least 3-5 days a week.       Increase Strength and Stamina Yes       Intervention Provide advice, education, support and counseling about physical activity/exercise needs.;Develop an individualized exercise prescription for aerobic and resistive training based on initial evaluation findings, risk stratification, comorbidities and participant's personal goals.       Expected Outcomes Short Term: Increase workloads from initial exercise prescription for resistance, speed, and METs.;Short Term: Perform resistance training exercises routinely during rehab and add in resistance training at home;Long Term: Improve cardiorespiratory fitness, muscular endurance and strength as measured by increased METs and functional capacity (6MWT)       Able to understand and use rate of perceived exertion (RPE)  scale Yes       Intervention Provide education and explanation on how to use RPE scale       Expected Outcomes Short Term: Able to use RPE daily in rehab to express subjective intensity level;Long Term:  Able to use RPE to guide intensity level when exercising independently       Knowledge and understanding of Target Heart Rate Range (THRR) Yes       Intervention Provide education and explanation of THRR including how the numbers were predicted and where they are located for reference       Expected Outcomes Short Term: Able to state/look up THRR;Long Term: Able to use THRR to govern intensity when exercising independently;Short Term: Able to use daily as guideline for intensity in rehab                Exercise Goals Re-Evaluation :  Exercise Goals Re-Evaluation     [place] Name 12/23/21 1559 01/08/22 1626           Exercise Goal Re-Evaluation   Exercise Goals Review Increase Physical Activity;Able to understand and use rate of perceived exertion (RPE) scale Increase Physical Activity;Able to understand and use rate of  perceived exertion (RPE) scale;Knowledge and understanding of Target Heart Rate Range (THRR);Understanding of Exercise Prescription;Able to check pulse independently;Increase Strength and Stamina      Comments Patient able to understand and use RPE scale appropriately. Reviewed MET's, goals and home ExRx. Pt tolerated exercise well with an average MET level of 3.75. Pt is currently meeting ACSM guidlines 5-7 days of 30 mins or more of exercise. Pt is playing pickleball and walking on the treadmill. Pt feels good about his goals of getting back to pickleball and gaining flexibility and stamina.      Expected Outcomes Progress workloads as tolerated to improve cardiorespiratory fitness and stamina. Pt will exercise on his own and gain strength. Will continue to monitor pt and progress workloads as tolerated without sign or symptom               Discharge Exercise Prescription (Final Exercise Prescription Changes):  Exercise Prescription Changes - 01/08/22 1621       Response to Exercise   Blood Pressure (Admit) 128/60    Blood Pressure (Exercise) 128/68    Blood Pressure (Exit) 102/62    Heart Rate (Admit) 58 bpm    Heart Rate (Exercise) 113 bpm    Heart Rate (Exit) 66 bpm    Rating of Perceived Exertion (Exercise) 11.5    Symptoms None    Comments Reviewed MET's, goals and home ExRx    Duration Continue with 30 min of aerobic exercise without signs/symptoms of physical distress.    Intensity THRR unchanged      Progression   Progression Continue to progress workloads to maintain intensity without signs/symptoms of physical distress.    Average METs 3.75      Resistance Training   Training Prescription No      Interval Training   Interval Training No      Recumbant Bike   Level 3    RPM 60    Minutes 15    METs 3.4      NuStep   Level 5    SPM 96    Minutes 15    METs 4.1      Home Exercise Plan   Plans to continue exercise at [facility] (comment)    Frequency  Add 2 additional days to program exercise  sessions.    Initial Home Exercises Provided 01/08/22             Nutrition:  Target Goals: Understanding of nutrition guidelines, daily intake of sodium <1512m, cholesterol <2035m calories 30% from fat and 7% or less from saturated fats, daily to have 5 or more servings of fruits and vegetables.  Biometrics:  Pre Biometrics - 12/19/21 1319       Pre Biometrics   Waist Circumference 39.75 inches    Hip Circumference 39 inches    Waist to Hip Ratio 1.02 %    Triceps Skinfold 8 mm    % Body Fat 25.1 %    Grip Strength 38 kg    Flexibility --   Pt unable to reach   Single Leg Stand 30 seconds              Nutrition Therapy Plan and Nutrition Goals:  Nutrition Therapy & Goals - 12/23/21 1627       Nutrition Therapy   Diet Heart Healthy Diet    Drug/Food Interactions Statins/Certain Fruits      Personal Nutrition Goals   Nutrition Goal Patient to choose a daily variety of fruit, vegetables, whole grains, lean protein/plant protein, nonfat dairy as part of heart healthy lifestyle    Personal Goal #2 Patient to limit sodium to <150029maily    Personal Goal #3 Patient to identify food sources of trans fats, saturated fat, sodium, and refined carbohydrates    Comments Patient reports interest in making dietary changes and better convenience options for heart health.      Intervention Plan   Intervention Prescribe, educate and counsel regarding individualized specific dietary modifications aiming towards targeted core components such as weight, hypertension, lipid management, diabetes, heart failure and other comorbidities.;Nutrition handout(s) given to patient.    Expected Outcomes Short Term Goal: Understand basic principles of dietary content, such as calories, fat, sodium, cholesterol and nutrients.;Long Term Goal: Adherence to prescribed nutrition plan.             Nutrition Assessments:  Nutrition Assessments -  12/30/21 1627       Rate Your Plate Scores   Pre Score 73            MEDIFICTS Score Key: ?70 Need to make dietary changes  40-70 Heart Healthy Diet ? 40 Therapeutic Level Cholesterol Diet   Flowsheet Row INTENSIVE [facility]ARDIAC [facility] [place] [place] [place] Total Score on Admission 73      Picture Your Plate Scores: <40<35healthy dietary pattern with much room for improvement. 41-50 Dietary pattern unlikely to meet recommendations for good health and room for improvement. 51-60 More healthful dietary pattern, with some room for improvement.  >60 Healthy dietary pattern, although there may be some specific behaviors that could be improved.    Nutrition Goals Re-Evaluation:  Nutrition Goals Re-Evaluation     [place] 12/23/21 1627             Goals   Current Weight 177 lb (80.3 kg)       Comment Lipoprotein A 99.1. Labs from December 2021 per cardiology notes show A1c 6.3       Expected Outcome Patient reports interest in making dietary changes and better convenience options for heart health.                Nutrition Goals Re-Evaluation:  Nutrition Goals Re-Evaluation     [place] 12/23/21 [phone]  Goals   Current Weight 177 lb (80.3 kg)       Comment Lipoprotein A 99.1. Labs from December 2021 per cardiology notes show A1c 6.3       Expected Outcome Patient reports interest in making dietary changes and better convenience options for heart health.                Nutrition Goals Discharge (Final Nutrition Goals Re-Evaluation):  Nutrition Goals Re-Evaluation - 12/23/21 1627       Goals   Current Weight 177 lb (80.3 kg)    Comment Lipoprotein A 99.1. Labs from December 2021 per cardiology notes show A1c 6.3    Expected Outcome Patient reports interest in making dietary changes and better convenience options for heart health.             Psychosocial: Target Goals: Acknowledge  presence or absence of significant depression and/or stress, maximize coping skills, provide positive support system. Participant is able to verbalize types and ability to use techniques and skills needed for reducing stress and depression.  Initial Review & Psychosocial Screening:  Initial Psych Review & Screening - 12/19/21 1102       Initial Review   Current issues with Current Depression;History of Depression      Family Dynamics   Good Support System? Yes    Comments Chapin who has a history of and current depression is dealing with the recent loss of his older brother.  Refoel feels supported by his girlfriend.  evander is currently on a anti-depressent medication -Trintellix is working well for him.  Sees couselor for several years every other week      Barriers   Psychosocial barriers to participate in program The patient should benefit from training in stress management and relaxation.      Screening Interventions   Interventions Encouraged to exercise;Provide feedback about the scores to participant    Expected Outcomes Short Term goal: Identification and review with participant of any Quality of Life or Depression concerns found by scoring the questionnaire.;Long Term goal: The participant improves quality of Life and PHQ9 Scores as seen by post scores and/or verbalization of changes             Quality of Life Scores:  Quality of Life - 12/19/21 1327       Quality of Life   Select Quality of Life      Quality of Life Scores   Health/Function Pre 22.13 %    Socioeconomic Pre 25 %    Psych/Spiritual Pre 18.33 %    Family Pre 22.1 %    GLOBAL Pre 22.05 %            Scores of 19 and below usually indicate a poorer quality of life in these areas.  A difference of  2-3 points is a clinically meaningful difference.  A difference of 2-3 points in the total score of the Quality of Life Index has been associated with significant improvement in overall quality of life,  self-image, physical symptoms, and general health in studies assessing change in quality of life.  PHQ-9: Review Flowsheet       12/19/2021  Depression screen PHQ 2/9  Decreased Interest 1  Down, Depressed, Hopeless 1  PHQ - 2 Score 2  Altered sleeping 1  Tired, decreased energy 2  Change in appetite 1  Feeling bad or failure about yourself  0  Trouble concentrating 0  Moving slowly or fidgety/restless 0  Suicidal thoughts 0  PHQ-9 Score 6  Difficult doing work/chores Not difficult at all   Interpretation of Total Score  Total Score Depression Severity:  1-4 = Minimal depression, 5-9 = Mild depression, 10-14 = Moderate depression, 15-19 = Moderately severe depression, 20-27 = Severe depression   Psychosocial Evaluation and Intervention:   Psychosocial Re-Evaluation:  Psychosocial Re-Evaluation     Row Name 12/23/21 1721 01/22/22 1102           Psychosocial Re-Evaluation   Current issues with Current Depression;History of Depression Current Depression;History of Depression      Comments Clair Gulling denied having any increased depression or stress on his first day of exercise. Will review quality of life in the upcoming week Quality of life reviewed. Brother recently passed away. Emotional support provided.      Expected Outcomes JIm will have decreased on controlled depression upon completion of intensive cardiac rehab. JIm will have decreased on controlled depression upon completion of intensive cardiac rehab.      Interventions Encouraged to attend Cardiac Rehabilitation for the exercise;Relaxation education;Stress management education Encouraged to attend Cardiac Rehabilitation for the exercise;Relaxation education;Stress management education      Continue Psychosocial Services  Follow up required by staff Follow up required by staff               Psychosocial Discharge (Final Psychosocial Re-Evaluation):  Psychosocial Re-Evaluation - 01/22/22 1117       Psychosocial  Re-Evaluation   Current issues with Current Depression;History of Depression    Comments Quality of life reviewed. Brother recently passed away. Emotional support provided.    Expected Outcomes JIm will have decreased on controlled depression upon completion of intensive cardiac rehab.    Interventions Encouraged to attend Cardiac Rehabilitation for the exercise;Relaxation education;Stress management education    Continue Psychosocial Services  Follow up required by staff             Vocational Rehabilitation: Provide vocational rehab assistance to qualifying candidates.   Vocational Rehab Evaluation & Intervention:  Vocational Rehab - 12/19/21 1048       Initial Vocational Rehab Evaluation & Intervention   Assessment shows need for Vocational Rehabilitation No      Vocational Rehab Re-Evaulation   Comments retired, no vocational rehab needs             Education: Education Goals: Education classes will be provided on a weekly basis, covering required topics. Participant will state understanding/return demonstration of topics presented.    Education     Row Name 12/23/21 1500     Education   Cardiac Education Topics Pritikin   Environmental consultant Psychosocial   Psychosocial Workshop Recognizing and Reducing Stress   Instruction Review Code 1- Verbalizes Understanding   Class Start Time 1400   Class Stop Time 1447   Class Time Calculation (min) 47 min    [place] Name 12/25/21 1600     Education   Cardiac Education Topics Pritikin   Financial trader   Weekly Topic Fast and Healthy Breakfasts   Instruction Review Code 1- Verbalizes Understanding   Class Start Time 1400   Class Stop Time 1500   Class Time Calculation (min) 60 min    [place] Name 12/27/21 1500     Education   Cardiac Education Topics Pritikin   Charity fundraiser  Dietitian   Select Nutrition   Nutrition Overview of the Pritikin Eating Plan   Instruction Review Code 1- Verbalizes Understanding   Class Start Time 1400   Class Stop Time 1453   Class Time Calculation (min) 53 min    [place] Name 12/30/21 1600     Workshops   Educator Exercise Printmaker Exercise   Exercise Workshop Hotel manager and Fall Prevention   Instruction Review Code 1- Verbalizes Understanding   Class Start Time 1400   Class Stop Time 1455   Class Time Calculation (min) 55 min    [place] Name 01/01/22 1500     Education   Cardiac Education Topics Pritikin   Financial trader   Weekly Topic Personalizing Your Pritikin Plate   Instruction Review Code 1- Verbalizes Understanding   Class Start Time 1355   Class Stop Time 1445   Class Time Calculation (min) 50 min    [place] Name 01/03/22 1500     Education   Cardiac Education Topics Pritikin   Scientist, research (life sciences)   Educator Nurse   Select Psychosocial   Psychosocial Healthy Minds, Bodies, Hearts   Instruction Review Code 1- Verbalizes Understanding   Class Start Time 1357   Class Stop Time 1433   Class Time Calculation (min) 36 min    [place] Name 01/08/22 1600     Education   Cardiac Education Topics [employer]   Educator Dietitian   Weekly Topic Tasty Appetizers and Snacks   Instruction Review Code 1- Verbalizes Understanding   Class Start Time 1400   Class Stop Time 1455   Class Time Calculation (min) 55 min    [place] Name 01/10/22 0900     Education   Cardiac Education Topics Pritikin   Select Core Videos     Core Videos   Educator Dietitian   Select Nutrition   Nutrition Other  Label Reading   Instruction Review Code 1- Verbalizes Understanding   Class Start Time [phone]   Class Stop Time 0856   Class Time Calculation (min) 44 min    [place] Name 01/13/22 1400     Education   Cardiac  Education Topics Pritikin   Scientist, research (life sciences)   Educator Dietitian   Select Nutrition   Nutrition Calorie Density   Instruction Review Code 1- Verbalizes Understanding   Class Start Time 1140   Class Stop Time 1230   Class Time Calculation (min) 50 min    [place] Name 01/13/22 1500     Education   Cardiac Education Topics --    [place] Name 01/15/22 1500     Education   Cardiac Education Topics [employer]   Educator Dietitian   Weekly Topic [employer] Desserts   Instruction Review Code 1- Verbalizes Understanding   Class Start Time 6378   Class Stop Time 1445   Class Time Calculation (min) 50 min    [place] Name 01/17/22 1500     Education   Cardiac Education Topics Pritikin   Environmental consultant Psychosocial   Psychosocial Workshop Managing Moods and Relationships   Instruction Review Code 1- Verbalizes Understanding   Class Start Time 1352   Class Stop  Time 1457   Class Time Calculation (min) 65 min    [place] Name 01/20/22 1500     Education   Cardiac Education Topics [place]   Select Workshops     Workshops   Educator Exercise Physiologist   Select Exercise   Exercise Workshop Exercise Basics: Building Your Action Plan   Instruction Review Code 1- Verbalizes Understanding   Class Start Time 1411   Class Stop Time 1451   Class Time Calculation (min) 40 min            Core Videos: Exercise    Move It!  Clinical staff conducted group or individual video education with verbal and written material and guidebook.  Patient learns the recommended Pritikin exercise program. Exercise with the goal of living a long, healthy life. Some of the health benefits of exercise include controlled diabetes, healthier blood pressure levels, improved cholesterol levels, improved heart and lung capacity, improved sleep, and better body composition. Everyone should speak  with their doctor before starting or changing an exercise routine.  Biomechanical Limitations Clinical staff conducted group or individual video education with verbal and written material and guidebook.  Patient learns how biomechanical limitations can impact exercise and how we can mitigate and possibly overcome limitations to have an impactful and balanced exercise routine.  Body Composition Clinical staff conducted group or individual video education with verbal and written material and guidebook.  Patient learns that body composition (ratio of muscle mass to fat mass) is a key component to assessing overall fitness, rather than body weight alone. Increased fat mass, especially visceral belly fat, can put [place] at increased risk for metabolic syndrome, type 2 diabetes, heart disease, and even death. It is recommended to combine diet and exercise (cardiovascular and resistance training) to improve your body composition. Seek guidance from your physician and exercise physiologist before implementing an exercise routine.  Exercise Action Plan Clinical staff conducted group or individual video education with verbal and written material and guidebook.  Patient learns the recommended strategies to achieve and enjoy long-term exercise adherence, including variety, self-motivation, self-efficacy, and positive decision making. Benefits of exercise include fitness, good health, weight management, more energy, better sleep, less stress, and overall well-being.  Medical   Heart Disease Risk Reduction Clinical staff conducted group or individual video education with verbal and written material and guidebook.  Patient learns our heart is our most vital organ as it circulates oxygen, nutrients, white blood cells, and hormones throughout the entire body, and carries waste away. Data supports a plant-based eating plan like the Pritikin Program for its effectiveness in slowing progression of and reversing heart  disease. The video provides a number of recommendations to address heart disease.   Metabolic Syndrome and Belly Fat  Clinical staff conducted group or individual video education with verbal and written material and guidebook.  Patient learns what metabolic syndrome is, how it leads to heart disease, and how one can reverse it and keep it from coming back. You have metabolic syndrome if you have 3 of the following 5 criteria: abdominal obesity, high blood pressure, high triglycerides, low HDL cholesterol, and high blood sugar.  Hypertension and Heart Disease Clinical staff conducted group or individual video education with verbal and written material and guidebook.  Patient learns that high blood pressure, or hypertension, is very common in the [place]. Hypertension is largely due to excessive salt intake, but other important risk factors include being overweight, physical inactivity, drinking too much alcohol, smoking, and not eating  enough potassium from fruits and vegetables. High blood pressure is a leading risk factor for heart attack, stroke, congestive heart failure, dementia, kidney failure, and premature death. Long-term effects of excessive salt intake include stiffening of the arteries and thickening of heart muscle and organ damage. Recommendations include ways to reduce hypertension and the risk of heart disease.  Diseases of Our Time - Focusing on Diabetes Clinical staff conducted group or individual video education with verbal and written material and guidebook.  Patient learns why the best way to stop diseases of our time is prevention, through food and other lifestyle changes. Medicine (such as prescription pills and surgeries) is often only a Band-Aid on the problem, not a long-term solution. Most common diseases of our time include obesity, type 2 diabetes, hypertension, heart disease, and cancer. The Pritikin Program is recommended and has been proven to help reduce, reverse,  and/or prevent the damaging effects of metabolic syndrome.  Nutrition   Overview of the Pritikin Eating Plan  Clinical staff conducted group or individual video education with verbal and written material and guidebook.  Patient learns about the [place] for disease risk reduction. The [place] emphasizes a wide variety of unrefined, minimally-processed carbohydrates, like fruits, vegetables, whole grains, and legumes. Go, Caution, and Stop food choices are explained. Plant-based and lean animal proteins are emphasized. Rationale provided for low sodium intake for blood pressure control, low added sugars for blood sugar stabilization, and low added fats and oils for coronary artery disease risk reduction and weight management.  Calorie Density  Clinical staff conducted group or individual video education with verbal and written material and guidebook.  Patient learns about calorie density and how it impacts the Pritikin Eating Plan. Knowing the characteristics of the food you choose will help you decide whether those foods will lead to weight gain or weight loss, and whether you want to consume more or less of them. Weight loss is usually a side effect of the Pritikin Eating Plan because of its focus on low calorie-dense foods.  Label Reading  Clinical staff conducted group or individual video education with verbal and written material and guidebook.  Patient learns about the Pritikin recommended label reading guidelines and corresponding recommendations regarding calorie density, added sugars, sodium content, and whole grains.  Dining Out - Part 1  Clinical staff conducted group or individual video education with verbal and written material and guidebook.  Patient learns that restaurant meals can be sabotaging because they can be so high in calories, fat, sodium, and/or sugar. Patient learns recommended strategies on how to positively address this and avoid unhealthy  pitfalls.  Facts on Fats  Clinical staff conducted group or individual video education with verbal and written material and guidebook.  Patient learns that lifestyle modifications can be just as effective, if not more so, as many medications for lowering your risk of heart disease. A Pritikin lifestyle can help to reduce your risk of inflammation and atherosclerosis (cholesterol build-up, or plaque, in the artery walls). Lifestyle interventions such as dietary choices and physical activity address the cause of atherosclerosis. A review of the types of fats and their impact on blood cholesterol levels, along with dietary recommendations to reduce fat intake is also included.  Nutrition Action Plan  Clinical staff conducted group or individual video education with verbal and written material and guidebook.  Patient learns how to incorporate Pritikin recommendations into their lifestyle. Recommendations include planning and keeping personal health goals in mind as an important  part of their success.  Healthy Mind-Set    Healthy Minds, Bodies, Hearts  Clinical staff conducted group or individual video education with verbal and written material and guidebook.  Patient learns how to identify when they are stressed. Video will discuss the impact of that stress, as well as the many benefits of stress management. Patient will also be introduced to stress management techniques. The way we think, act, and feel has an impact on our hearts.  How Our Thoughts Can Heal Our Hearts  Clinical staff conducted group or individual video education with verbal and written material and guidebook.  Patient learns that negative thoughts can cause depression and anxiety. This can result in negative lifestyle behavior and serious health problems. Cognitive behavioral therapy is an effective method to help control our thoughts in order to change and improve our emotional outlook.  Additional Videos:  Exercise    Improving  Performance  Clinical staff conducted group or individual video education with verbal and written material and guidebook.  Patient learns to use a non-linear approach by alternating intensity levels and lengths of time spent exercising to help burn more calories and lose more body fat. Cardiovascular exercise helps improve heart health, metabolism, hormonal balance, blood sugar control, and recovery from fatigue. Resistance training improves strength, endurance, balance, coordination, reaction time, metabolism, and muscle mass. Flexibility exercise improves circulation, posture, and balance. Seek guidance from your physician and exercise physiologist before implementing an exercise routine and learn your capabilities and proper form for all exercise.  Introduction to Yoga  Clinical staff conducted group or individual video education with verbal and written material and guidebook.  Patient learns about yoga, a discipline of the coming together of mind, breath, and body. The benefits of yoga include improved flexibility, improved range of motion, better posture and core strength, increased lung function, weight loss, and positive self-image. Yoga's heart health benefits include lowered blood pressure, healthier heart rate, decreased cholesterol and triglyceride levels, improved immune function, and reduced stress. Seek guidance from your physician and exercise physiologist before implementing an exercise routine and learn your capabilities and proper form for all exercise.  Medical   Aging: Enhancing Your Quality of Life  Clinical staff conducted group or individual video education with verbal and written material and guidebook.  Patient learns key strategies and recommendations to stay in good physical health and enhance quality of life, such as prevention strategies, having an advocate, securing a [place], and keeping a list of medications and system for tracking them. It  also discusses how to avoid risk for bone loss.  Biology of Weight Control  Clinical staff conducted group or individual video education with verbal and written material and guidebook.  Patient learns that weight gain occurs because we consume more calories than we burn (eating more, moving less). Even if your body weight is normal, you may have higher ratios of fat compared to muscle mass. Too much body fat puts you at increased risk for cardiovascular disease, heart attack, stroke, type 2 diabetes, and obesity-related cancers. In addition to exercise, following the [place] can help reduce your risk.  Decoding Lab Results  Clinical staff conducted group or individual video education with verbal and written material and guidebook.  Patient learns that lab test reflects one measurement whose values change over time and are influenced by many factors, including medication, stress, sleep, exercise, food, hydration, pre-existing medical conditions, and more. It is recommended to use the knowledge from  this video to become more involved with your lab results and evaluate your numbers to speak with your doctor.   Diseases of Our Time - Overview  Clinical staff conducted group or individual video education with verbal and written material and guidebook.  Patient learns that according to the CDC, 50% to 70% of chronic diseases (such as obesity, type 2 diabetes, elevated lipids, hypertension, and heart disease) are avoidable through lifestyle improvements including healthier food choices, listening to satiety cues, and increased physical activity.  Sleep Disorders Clinical staff conducted group or individual video education with verbal and written material and guidebook.  Patient learns how good quality and duration of sleep are important to overall health and well-being. Patient also learns about sleep disorders and how they impact health along with recommendations to address them, including  discussing with a physician.  Nutrition  Dining Out - Part 2 Clinical staff conducted group or individual video education with verbal and written material and guidebook.  Patient learns how to plan ahead and communicate in order to maximize their dining experience in a healthy and nutritious manner. Included are recommended food choices based on the type of restaurant the patient is visiting.   Fueling a Best boy conducted group or individual video education with verbal and written material and guidebook.  There is a strong connection between our food choices and our health. Diseases like obesity and type 2 diabetes are very prevalent and are in large-part due to lifestyle choices. The Pritikin Eating Plan provides plenty of food and hunger-curbing satisfaction. It is easy to follow, affordable, and helps reduce health risks.  Menu Workshop  Clinical staff conducted group or individual video education with verbal and written material and guidebook.  Patient learns that restaurant meals can sabotage health goals because they are often packed with calories, fat, sodium, and sugar. Recommendations include strategies to plan ahead and to communicate with the manager, chef, or server to help order a healthier meal.  Planning Your Eating Strategy  Clinical staff conducted group or individual video education with verbal and written material and guidebook.  Patient learns about the [place] and its benefit of reducing the risk of disease. The [place] does not focus on calories. Instead, it emphasizes high-quality, nutrient-rich foods. By knowing the characteristics of the foods, we choose, we can determine their calorie density and make informed decisions.  Targeting Your Nutrition Priorities  Clinical staff conducted group or individual video education with verbal and written material and guidebook.  Patient learns that lifestyle habits have a tremendous  impact on disease risk and progression. This video provides eating and physical activity recommendations based on your personal health goals, such as reducing LDL cholesterol, losing weight, preventing or controlling type 2 diabetes, and reducing high blood pressure.  Vitamins and Minerals  Clinical staff conducted group or individual video education with verbal and written material and guidebook.  Patient learns different ways to obtain key vitamins and minerals, including through a recommended healthy diet. It is important to discuss all supplements you take with your doctor.   Healthy Mind-Set    Smoking Cessation  Clinical staff conducted group or individual video education with verbal and written material and guidebook.  Patient learns that cigarette smoking and tobacco addiction pose a serious health risk which affects millions of people. Stopping smoking will significantly reduce the risk of heart disease, lung disease, and many forms of cancer. Recommended strategies for quitting are covered, including working with  your doctor to develop a successful plan.  Culinary   Becoming a Financial trader conducted group or individual video education with verbal and written material and guidebook.  Patient learns that cooking at home can be healthy, cost-effective, quick, and puts them in control. Keys to cooking healthy recipes will include looking at your recipe, assessing your equipment needs, planning ahead, making it simple, choosing cost-effective seasonal ingredients, and limiting the use of added fats, salts, and sugars.  Cooking - Breakfast and Snacks  Clinical staff conducted group or individual video education with verbal and written material and guidebook.  Patient learns how important breakfast is to satiety and nutrition through the entire day. Recommendations include key foods to eat during breakfast to help stabilize blood sugar levels and to prevent overeating at meals  later in the day. Planning ahead is also a key component.  Cooking - Human resources officer conducted group or individual video education with verbal and written material and guidebook.  Patient learns eating strategies to improve overall health, including an approach to cook more at home. Recommendations include thinking of animal protein as a side on your plate rather than center stage and focusing instead on lower calorie dense options like vegetables, fruits, whole grains, and plant-based proteins, such as beans. Making sauces in large quantities to freeze for later and leaving the skin on your vegetables are also recommended to maximize your experience.  Cooking - Healthy Salads and Dressing Clinical staff conducted group or individual video education with verbal and written material and guidebook.  Patient learns that vegetables, fruits, whole grains, and legumes are the foundations of the [place]. Recommendations include how to incorporate each of these in flavorful and healthy salads, and how to create homemade salad dressings. Proper handling of ingredients is also covered. Cooking - Soups and [employer] - Soups and Desserts Clinical staff conducted group or individual video education with verbal and written material and guidebook.  Patient learns that Pritikin soups and desserts make for easy, nutritious, and delicious snacks and meal components that are low in sodium, fat, sugar, and calorie density, while high in vitamins, minerals, and filling fiber. Recommendations include simple and healthy ideas for soups and desserts.   Overview     The Pritikin Solution Program Overview Clinical staff conducted group or individual video education with verbal and written material and guidebook.  Patient learns that the results of the [place] Program have been documented in more than 100 articles published in peer-reviewed journals, and the benefits include reducing  risk factors for (and, in some cases, even reversing) high cholesterol, high blood pressure, type 2 diabetes, obesity, and more! An overview of the three key pillars of the Pritikin Program will be covered: eating well, doing regular exercise, and having a healthy mind-set.  WORKSHOPS  Exercise: Exercise Basics: Building Your Action Plan Clinical staff led group instruction and group discussion with PowerPoint presentation and patient guidebook. To enhance the learning environment the use of posters, models and videos may be added. At the conclusion of this workshop, patients will comprehend the difference between physical activity and exercise, as well as the benefits of incorporating both, into their routine. Patients will understand the FITT (Frequency, Intensity, Time, and Type) principle and how to use it to build an exercise action plan. In addition, safety concerns and other considerations for exercise and cardiac rehab will be addressed by the presenter. The purpose of this lesson is to promote  a comprehensive and effective weekly exercise routine in order to improve patients' overall level of fitness.   Managing Heart Disease: Your Path to a Healthier Heart Clinical staff led group instruction and group discussion with PowerPoint presentation and patient guidebook. To enhance the learning environment the use of posters, models and videos may be added.At the conclusion of this workshop, patients will understand the anatomy and physiology of the heart. Additionally, they will understand how Pritikin's three pillars impact the risk factors, the progression, and the management of heart disease.  The purpose of this lesson is to provide a high-level overview of the heart, heart disease, and how the Pritikin lifestyle positively impacts risk factors.  Exercise Biomechanics Clinical staff led group instruction and group discussion with PowerPoint presentation and patient guidebook. To enhance  the learning environment the use of posters, models and videos may be added. Patients will learn how the structural parts of their bodies function and how these functions impact their daily activities, movement, and exercise. Patients will learn how to promote a neutral spine, learn how to manage pain, and identify ways to improve their physical movement in order to promote healthy living. The purpose of this lesson is to expose patients to common physical limitations that impact physical activity. Participants will learn practical ways to adapt and manage aches and pains, and to minimize their effect on regular exercise. Patients will learn how to maintain good posture while sitting, walking, and lifting.  Balance Training and Fall Prevention  Clinical staff led group instruction and group discussion with PowerPoint presentation and patient guidebook. To enhance the learning environment the use of posters, models and videos may be added. At the conclusion of this workshop, patients will understand the importance of their sensorimotor skills (vision, proprioception, and the vestibular system) in maintaining their ability to balance as they age. Patients will apply a variety of balancing exercises that are appropriate for their current level of function. Patients will understand the common causes for poor balance, possible solutions to these problems, and ways to modify their physical environment in order to minimize their fall risk. The purpose of this lesson is to teach patients about the importance of maintaining balance as they age and ways to minimize their risk of falling.  WORKSHOPS   Nutrition:  Fueling a Scientist, research (physical sciences) led group instruction and group discussion with PowerPoint presentation and patient guidebook. To enhance the learning environment the use of posters, models and videos may be added. Patients will review the foundational principles of the [place]  and understand what constitutes a serving size in each of the food groups. Patients will also learn Pritikin-friendly foods that are better choices when away from home and review make-ahead meal and snack options. Calorie density will be reviewed and applied to three nutrition priorities: weight maintenance, weight loss, and weight gain. The purpose of this lesson is to reinforce (in a group setting) the key concepts around what patients are recommended to eat and how to apply these guidelines when away from home by planning and selecting Pritikin-friendly options. Patients will understand how calorie density may be adjusted for different weight management goals.  Mindful Eating  Clinical staff led group instruction and group discussion with PowerPoint presentation and patient guidebook. To enhance the learning environment the use of posters, models and videos may be added. Patients will briefly review the concepts of the [place] and the importance of low-calorie dense foods. The concept of mindful eating will be  introduced as well as the importance of paying attention to internal hunger signals. Triggers for non-hunger eating and techniques for dealing with triggers will be explored. The purpose of this lesson is to provide patients with the opportunity to review the basic principles of the [place], discuss the value of eating mindfully and how to measure internal cues of hunger and fullness using the Hunger Scale. Patients will also discuss reasons for non-hunger eating and learn strategies to use for controlling emotional eating.  Targeting Your Nutrition Priorities Clinical staff led group instruction and group discussion with PowerPoint presentation and patient guidebook. To enhance the learning environment the use of posters, models and videos may be added. Patients will learn how to determine their genetic susceptibility to disease by reviewing their family history. Patients  will gain insight into the importance of diet as part of an overall healthy lifestyle in mitigating the impact of genetics and other environmental insults. The purpose of this lesson is to provide patients with the opportunity to assess their personal nutrition priorities by looking at their family history, their own health history and current risk factors. Patients will also be able to discuss ways of prioritizing and modifying the [place] for their highest risk areas  Menu  Clinical staff led group instruction and group discussion with PowerPoint presentation and patient guidebook. To enhance the learning environment the use of posters, models and videos may be added. Using menus brought in from [employer], or printed from [employer], patients will apply the [place] dining out guidelines that were presented in the [employer] video. Patients will also be able to practice these guidelines in a variety of provided scenarios. The purpose of this lesson is to provide patients with the opportunity to practice hands-on learning of the [place] with actual menus and practice scenarios.  Label Reading Clinical staff led group instruction and group discussion with PowerPoint presentation and patient guidebook. To enhance the learning environment the use of posters, models and videos may be added. Patients will review and discuss the [facility] label reading guidelines presented in [facility]'s Label Reading Educational series video. Using fool labels brought in from local grocery stores and markets, patients will apply the label reading guidelines and determine if the packaged food meet the [facility] guidelines. The purpose of this lesson is to provide patients with the opportunity to review, discuss, and practice hands-on learning of the [facility] Label Reading guidelines with actual packaged food labels. [place] Workshops  are designed to teach patients ways to prepare quick, simple, and affordable recipes at home. The importance of nutrition's role in chronic disease risk reduction is reflected in its emphasis in the overall [facility] program. By learning how to prepare essential core [facility] Eating Plan recipes, patients will increase control over what they eat; be able to customize the flavor of foods without the use of added salt, sugar, or fat; and improve the quality of the food they consume. By learning a set of core recipes which are easily assembled, quickly prepared, and affordable, patients are more likely to prepare more healthy foods at home. These workshops focus on convenient breakfasts, simple entres, side dishes, and desserts which can be prepared with minimal effort and are consistent with nutrition recommendations for cardiovascular risk reduction. Cooking [employer] are taught by a Engineer, materials (RD) who has been trained by the [employer]. The chef or RD has a clear  understanding of the importance of minimizing - if not completely eliminating - added fat, sugar, and sodium in recipes. Throughout the series of [place] Workshop sessions, patients will learn about healthy ingredients and efficient methods of cooking to build confidence in their capability to prepare    Cooking School weekly topics:  Adding Flavor- Sodium-Free  Fast and Healthy Breakfasts  Powerhouse Plant-Based Proteins  Satisfying Salads and Dressings  Simple Sides and Sauces  International Cuisine-Spotlight on the [place] Zones  Delicious Desserts  Savory Soups  Efficiency Cooking - Meals in a Snap  Tasty Appetizers and Snacks  Comforting Weekend Breakfasts  One-Pot Wonders   Fast Evening Meals  Easy [place] (Psychosocial): New Thoughts, New Behaviors Clinical staff led group instruction and group discussion with  PowerPoint presentation and patient guidebook. To enhance the learning environment the use of posters, models and videos may be added. Patients will learn and practice techniques for developing effective health and lifestyle goals. Patients will be able to effectively apply the goal setting process learned to develop at least one new personal goal.  The purpose of this lesson is to expose patients to a new skill set of behavior modification techniques such as techniques setting SMART goals, overcoming barriers, and achieving new thoughts and new behaviors.  Managing Moods and Relationships Clinical staff led group instruction and group discussion with PowerPoint presentation and patient guidebook. To enhance the learning environment the use of posters, models and videos may be added. Patients will learn how emotional and chronic stress factors can impact their health and relationships. They will learn healthy ways to manage their moods and utilize positive coping mechanisms. In addition, ICR patients will learn ways to improve communication skills. The purpose of this lesson is to expose patients to ways of understanding how one's mood and health are intimately connected. Developing a healthy outlook can help build positive relationships and connections with others. Patients will understand the importance of utilizing effective communication skills that include actively listening and being heard. They will learn and understand the importance of the "4 Cs" and especially Connections in fostering of a Healthy Mind-Set.  Healthy Sleep for a Healthy Heart Clinical staff led group instruction and group discussion with PowerPoint presentation and patient guidebook. To enhance the learning environment the use of posters, models and videos may be added. At the conclusion of this workshop, patients will be able to demonstrate knowledge of the importance of sleep to overall health, well-being, and quality of life. They  will understand the symptoms of, and treatments for, common sleep disorders. Patients will also be able to identify daytime and nighttime behaviors which impact sleep, and they will be able to apply these tools to help manage sleep-related challenges. The purpose of this lesson is to provide patients with a general overview of sleep and outline the importance of quality sleep. Patients will learn about a few of the most common sleep disorders. Patients will also be introduced to the concept of "sleep hygiene," and discover ways to self-manage certain sleeping problems through simple daily behavior changes. Finally, the workshop will motivate patients by clarifying the links between quality sleep and their goals of heart-healthy living.   Recognizing and Reducing Stress Clinical staff led group instruction and group discussion with PowerPoint presentation and patient guidebook. To enhance the learning environment the use of posters, models and videos may be added. At the conclusion of this workshop, patients will be able  to understand the types of stress reactions, differentiate between acute and chronic stress, and recognize the impact that chronic stress has on their health. They will also be able to apply different coping mechanisms, such as reframing negative self-talk. Patients will have the opportunity to practice a variety of stress management techniques, such as deep abdominal breathing, progressive muscle relaxation, and/or guided imagery.  The purpose of this lesson is to educate patients on the role of stress in their lives and to provide healthy techniques for coping with it.  Learning Barriers/Preferences:  Learning Barriers/Preferences - 12/19/21 1328       Learning Barriers/Preferences   Learning Barriers Hearing;Sight   pt wears glasses   Learning Preferences Written Material;Skilled Demonstration;Individual Instruction;Group Instruction;Pictoral             Education  Topics:  Knowledge Questionnaire Score:  Knowledge Questionnaire Score - 12/19/21 1329       Knowledge Questionnaire Score   Pre Score 21/24             Core Components/Risk Factors/Patient Goals at Admission:  Personal Goals and Risk Factors at Admission - 12/19/21 1048       Core Components/Risk Factors/Patient Goals on Admission    Weight Management Weight Maintenance;Yes    Intervention Weight Management: Develop a combined nutrition and exercise program designed to reach desired caloric intake, while maintaining appropriate intake of nutrient and fiber, sodium and fats, and appropriate energy expenditure required for the weight goal.;Weight Management: Provide education and appropriate resources to help participant work on and attain dietary goals.    Admit Weight 178 lb 5.6 oz (80.9 kg)    Expected Outcomes Long Term: Adherence to nutrition and physical activity/exercise program aimed toward attainment of established weight goal;Weight Maintenance: Understanding of the daily nutrition guidelines, which includes 25-35% calories from fat, 7% or less cal from saturated fats, less than 233m cholesterol, less than 1.5gm of sodium, & 5 or more servings of fruits and vegetables daily;Understanding recommendations for meals to include 15-35% energy as protein, 25-35% energy from fat, 35-60% energy from carbohydrates, less than 2065mof dietary cholesterol, 20-35 gm of total fiber daily    Hypertension Yes    Intervention Provide education on lifestyle modifcations including regular physical activity/exercise, weight management, moderate sodium restriction and increased consumption of fresh fruit, vegetables, and low fat dairy, alcohol moderation, and smoking cessation.;Monitor prescription use compliance.    Expected Outcomes Short Term: Continued assessment and intervention until BP is < 140/9042mG in hypertensive participants. < 130/50m79m in hypertensive participants with diabetes,  heart failure or chronic kidney disease.;Long Term: Maintenance of blood pressure at goal levels.    Lipids Yes    Intervention Provide education and support for participant on nutrition & aerobic/resistive exercise along with prescribed medications to achieve LDL <70mg27mL >40mg.24mExpected Outcomes Short Term: Participant states understanding of desired cholesterol values and is compliant with medications prescribed. Participant is following exercise prescription and nutrition guidelines.;Long Term: Cholesterol controlled with medications as prescribed, with individualized exercise RX and with personalized nutrition plan. Value goals: LDL < 70mg, 51m> 40 mg.    Personal Goal Claudia dLenwards [facility] well [facility] well being    Intervention Provide support and encouragement to increase physical activity.  Strongly encourage consistent home exercise and routine.    Expected Outcomes [place] [facility] to cardiac rehab staff he has return to playing pickleball with no  complaints or issues             Core Components/Risk Factors/Patient Goals Review:   Goals and Risk Factor Review     Row Name 12/23/21 1723 01/22/22 0835           Core Components/Risk Factors/Patient Goals Review   Personal Goals Review Weight Management/Obesity;Hypertension;Lipids Weight Management/Obesity;Hypertension;Lipids      Review Clair Gulling started intensive cardiac rehab on 12/23/21. JIm did well with exercise Clair Gulling has been doing well with intensive cardiac rehab. Vital signs have been stable      Expected Outcomes Clair Gulling will continue to participate in intensive cardiac rehab for exercise, nutrition and lifestyle modifications Clair Gulling will continue to participate in intensive cardiac rehab for exercise, nutrition and lifestyle modifications               Core Components/Risk Factors/Patient Goals at Discharge (Final Review):   Goals and Risk Factor Review -  01/22/22 0835       Core Components/Risk Factors/Patient Goals Review   Personal Goals Review Weight Management/Obesity;Hypertension;Lipids    Review Clair Gulling has been doing well with intensive cardiac rehab. Vital signs have been stable    Expected Outcomes Clair Gulling will continue to participate in intensive cardiac rehab for exercise, nutrition and lifestyle modifications             ITP Comments:  ITP Comments     Row Name 12/19/21 1129 12/23/21 1720 01/22/22 0832       ITP Comments Dr. Radford Pax Medical Director Introduction to  [facility] education Program/Intensive Cardiac Rehab.  Initial [facility] Orientation packet and video reviewed with pt 30 Day ITP Review. JIm started ICR on 12/23/21 and did well with exercise 30 Day ITP Review. JIm has good attendance and participation in intensive cardiac rehab              Comments: See ITP comments.Harrell Gave RN BSN

## 2022-01-24 ENCOUNTER — Encounter (HOSPITAL_COMMUNITY)
Admission: RE | Admit: 2022-01-24 | Discharge: 2022-01-24 | Disposition: A | Discharge status: Home / Self Care | Payer: MEDICARE | Source: Ambulatory Visit | Attending: Cardiology | Admitting: Cardiology

## 2022-01-24 DIAGNOSIS — I214 Non-ST elevation (NSTEMI) myocardial infarction: Secondary | ICD-10-CM

## 2022-01-24 DIAGNOSIS — I441 Atrioventricular block, second degree: Secondary | ICD-10-CM | POA: Diagnosis not present

## 2022-01-24 DIAGNOSIS — I10 Essential (primary) hypertension: Secondary | ICD-10-CM | POA: Diagnosis not present

## 2022-01-24 DIAGNOSIS — I252 Old myocardial infarction: Secondary | ICD-10-CM | POA: Diagnosis not present

## 2022-01-24 DIAGNOSIS — Z950 Presence of cardiac pacemaker: Secondary | ICD-10-CM | POA: Diagnosis not present

## 2022-01-24 DIAGNOSIS — I2510 Atherosclerotic heart disease of native coronary artery without angina pectoris: Secondary | ICD-10-CM | POA: Diagnosis not present

## 2022-01-27 ENCOUNTER — Encounter (HOSPITAL_COMMUNITY)
Admission: RE | Admit: 2022-01-27 | Discharge: 2022-01-27 | Disposition: A | Discharge status: Home / Self Care | Payer: MEDICARE | Source: Ambulatory Visit | Attending: Cardiology | Admitting: Cardiology

## 2022-01-27 DIAGNOSIS — I214 Non-ST elevation (NSTEMI) myocardial infarction: Secondary | ICD-10-CM | POA: Diagnosis not present

## 2022-01-27 DIAGNOSIS — I441 Atrioventricular block, second degree: Secondary | ICD-10-CM | POA: Diagnosis not present

## 2022-01-27 DIAGNOSIS — I2510 Atherosclerotic heart disease of native coronary artery without angina pectoris: Secondary | ICD-10-CM | POA: Diagnosis not present

## 2022-01-27 DIAGNOSIS — Z950 Presence of cardiac pacemaker: Secondary | ICD-10-CM | POA: Diagnosis not present

## 2022-01-27 DIAGNOSIS — I10 Essential (primary) hypertension: Secondary | ICD-10-CM | POA: Diagnosis not present

## 2022-01-27 DIAGNOSIS — I252 Old myocardial infarction: Secondary | ICD-10-CM | POA: Diagnosis not present

## 2022-01-29 ENCOUNTER — Encounter (HOSPITAL_COMMUNITY)
Admission: RE | Admit: 2022-01-29 | Discharge: 2022-01-29 | Disposition: A | Discharge status: Home / Self Care | Payer: MEDICARE | Source: Ambulatory Visit | Attending: Cardiology | Admitting: Cardiology

## 2022-01-29 DIAGNOSIS — I10 Essential (primary) hypertension: Secondary | ICD-10-CM | POA: Diagnosis not present

## 2022-01-29 DIAGNOSIS — Z950 Presence of cardiac pacemaker: Secondary | ICD-10-CM | POA: Diagnosis not present

## 2022-01-29 DIAGNOSIS — I214 Non-ST elevation (NSTEMI) myocardial infarction: Secondary | ICD-10-CM

## 2022-01-29 DIAGNOSIS — I441 Atrioventricular block, second degree: Secondary | ICD-10-CM | POA: Diagnosis not present

## 2022-01-29 DIAGNOSIS — I252 Old myocardial infarction: Secondary | ICD-10-CM | POA: Diagnosis not present

## 2022-01-29 DIAGNOSIS — I2510 Atherosclerotic heart disease of native coronary artery without angina pectoris: Secondary | ICD-10-CM | POA: Diagnosis not present

## 2022-01-31 ENCOUNTER — Encounter (HOSPITAL_COMMUNITY)
Admission: RE | Admit: 2022-01-31 | Discharge: 2022-01-31 | Disposition: A | Discharge status: Home / Self Care | Payer: MEDICARE | Source: Ambulatory Visit | Attending: Cardiology | Admitting: Cardiology

## 2022-01-31 DIAGNOSIS — I214 Non-ST elevation (NSTEMI) myocardial infarction: Secondary | ICD-10-CM

## 2022-01-31 DIAGNOSIS — I2510 Atherosclerotic heart disease of native coronary artery without angina pectoris: Secondary | ICD-10-CM | POA: Diagnosis not present

## 2022-01-31 DIAGNOSIS — I441 Atrioventricular block, second degree: Secondary | ICD-10-CM | POA: Diagnosis not present

## 2022-01-31 DIAGNOSIS — I10 Essential (primary) hypertension: Secondary | ICD-10-CM | POA: Diagnosis not present

## 2022-01-31 DIAGNOSIS — I252 Old myocardial infarction: Secondary | ICD-10-CM | POA: Diagnosis not present

## 2022-01-31 DIAGNOSIS — Z950 Presence of cardiac pacemaker: Secondary | ICD-10-CM | POA: Diagnosis not present

## 2022-01-31 NOTE — Progress Notes (Signed)
Remote pacemaker transmission.   

## 2022-02-03 ENCOUNTER — Encounter (HOSPITAL_COMMUNITY)
Admission: RE | Admit: 2022-02-03 | Discharge: 2022-02-03 | Disposition: A | Discharge status: Home / Self Care | Payer: MEDICARE | Source: Ambulatory Visit | Attending: Cardiology | Admitting: Cardiology

## 2022-02-03 DIAGNOSIS — I214 Non-ST elevation (NSTEMI) myocardial infarction: Secondary | ICD-10-CM | POA: Insufficient documentation

## 2022-02-05 ENCOUNTER — Encounter (HOSPITAL_COMMUNITY)
Admission: RE | Admit: 2022-02-05 | Discharge: 2022-02-05 | Disposition: A | Discharge status: Home / Self Care | Payer: MEDICARE | Source: Ambulatory Visit | Attending: Cardiology | Admitting: Cardiology

## 2022-02-05 DIAGNOSIS — I214 Non-ST elevation (NSTEMI) myocardial infarction: Secondary | ICD-10-CM | POA: Diagnosis not present

## 2022-02-05 DIAGNOSIS — F4323 Adjustment disorder with mixed anxiety and depressed mood: Secondary | ICD-10-CM | POA: Diagnosis not present

## 2022-02-06 DIAGNOSIS — Z23 Encounter for immunization: Secondary | ICD-10-CM | POA: Diagnosis not present

## 2022-02-07 ENCOUNTER — Encounter (HOSPITAL_COMMUNITY)
Admission: RE | Admit: 2022-02-07 | Discharge: 2022-02-07 | Disposition: A | Discharge status: Home / Self Care | Payer: MEDICARE | Source: Ambulatory Visit | Attending: Cardiology | Admitting: Cardiology

## 2022-02-07 DIAGNOSIS — I214 Non-ST elevation (NSTEMI) myocardial infarction: Secondary | ICD-10-CM | POA: Diagnosis not present

## 2022-02-10 ENCOUNTER — Encounter (HOSPITAL_COMMUNITY)
Admission: RE | Admit: 2022-02-10 | Discharge: 2022-02-10 | Disposition: A | Discharge status: Home / Self Care | Payer: MEDICARE | Source: Ambulatory Visit | Attending: Cardiology | Admitting: Cardiology

## 2022-02-10 DIAGNOSIS — I214 Non-ST elevation (NSTEMI) myocardial infarction: Secondary | ICD-10-CM

## 2022-02-12 ENCOUNTER — Encounter (HOSPITAL_COMMUNITY)
Admission: RE | Admit: 2022-02-12 | Discharge: 2022-02-12 | Disposition: A | Discharge status: Home / Self Care | Payer: MEDICARE | Source: Ambulatory Visit | Attending: Cardiology | Admitting: Cardiology

## 2022-02-12 DIAGNOSIS — I214 Non-ST elevation (NSTEMI) myocardial infarction: Secondary | ICD-10-CM | POA: Diagnosis not present

## 2022-02-12 NOTE — Progress Notes (Signed)
Cardiac Individual Treatment Plan  Patient Details  Name: Phillip Allen MRN: [id number] Date of Birth: 10/02/1940 Referring Provider:   Flowsheet Row INTENSIVE CARDIAC REHAB ORIENT from 12/19/2021 in [place]  Referring Provider Dr Vernell Leep, MD       Initial Encounter Date:  [place] from 12/19/2021 in [place]  Date 12/19/21       Visit Diagnosis: NSTEMI (non-ST elevated myocardial infarction) [facility])  Patient's Home Medications on Admission:  Current Outpatient Medications:    aspirin EC 81 MG tablet, Take 1 tablet (81 mg total) by mouth daily., Disp: 30 tablet, Rfl: 3   Collagen-Boron-Hyaluronic Acid (MOVE FREE ULTRA JOINT HEALTH PO), Take 1 tablet by mouth daily., Disp: , Rfl:    diclofenac Sodium (VOLTAREN) 1 % GEL, Apply 1 application  topically 4 (four) times daily as needed (pain)., Disp: , Rfl:    docusate sodium (COLACE) 100 MG capsule, Take 100 mg by mouth 2 (two) times daily., Disp: , Rfl:    FARXIGA 5 MG TABS tablet, Take 5 mg by mouth every morning., Disp: , Rfl:    Krill Oil 500 MG CAPS, Take 500 mg by mouth daily., Disp: , Rfl:    losartan (COZAAR) 25 MG tablet, Take 12.5 mg by mouth daily., Disp: , Rfl:    meloxicam (MOBIC) 7.5 MG tablet, Oral for 30, Disp: , Rfl:    metoprolol succinate (TOPROL XL) 25 MG 24 hr tablet, Take 1 tablet (25 mg total) by mouth daily., Disp: 30 tablet, Rfl: 0   Multiple Vitamins-Minerals (MULTIVITAMIN MEN 79+ PO), Take 1 tablet by mouth daily., Disp: , Rfl:    nitroGLYCERIN (NITROSTAT) 0.4 MG SL tablet, Place 1 tablet (0.4 mg total) under the tongue every 5 (five) minutes as needed for chest pain., Disp: 30 tablet, Rfl: 3   Polyvinyl Alcohol-Povidone (REFRESH OP), Place 1 drop into both eyes daily as needed (dry eyes)., Disp: , Rfl:    rosuvastatin (CRESTOR) 40 MG tablet, Take 40 mg by mouth daily., Disp: , Rfl:    vortioxetine  HBr (TRINTELLIX) 20 MG TABS tablet, Take 20 mg by mouth daily., Disp: , Rfl:   Past Medical History: Past Medical History:  Diagnosis Date   Depression    Hyperlipidemia     Tobacco Use: Social History   Tobacco Use  Smoking Status Former   Packs/day: 0.25   Years: 10.00   Total pack years: 2.50   Types: Cigarettes   Quit date: 1996   Years since quitting: 27.7  Smokeless Tobacco Never  Tobacco Comments   on/off since age 81    Labs: Review Flowsheet        No data to display          Capillary Blood Glucose: No results found for: "GLUCAP"   Exercise Target Goals: Exercise Program Goal: Individual exercise prescription set using results from initial 6 min walk test and THRR while considering  patient's activity barriers and safety.   Exercise Prescription Goal: Initial exercise prescription builds to 30-45 minutes a day of aerobic activity, 2-3 days per week.  Home exercise guidelines will be given to patient during program as part of exercise prescription that the participant will acknowledge.  Activity Barriers & Risk Stratification:  Activity Barriers & Cardiac Risk Stratification - 12/19/21 1321       Activity Barriers & Cardiac Risk Stratification   Activity Barriers Back Problems;Joint Problems    Cardiac  Risk Stratification High             6 Minute Walk:  6 Minute Walk     Row Name 12/19/21 1319         6 Minute Walk   Phase Initial     Distance 1511 feet     Walk Time 6 minutes     # of Rest Breaks 0     MPH 2.86     METS 2.56     RPE 9     Perceived Dyspnea  0     VO2 Peak 8.94     Symptoms No     Resting HR 56 bpm     Resting BP 107/58     Resting Oxygen Saturation  97 %     Exercise Oxygen Saturation  during 6 min walk 98 %     Max Ex. HR 76 bpm     Max Ex. BP 126/58     2 Minute Post BP 122/62              Oxygen Initial Assessment:   Oxygen Re-Evaluation:   Oxygen Discharge (Final Oxygen  Re-Evaluation):   Initial Exercise Prescription:  Initial Exercise Prescription - 12/19/21 1300       Date of Initial Exercise RX and Referring Provider   Date 12/19/21    Referring Provider Dr Vernell Leep, MD    Expected Discharge Date 02/21/22      Recumbant Bike   Level 2    RPM 60    Minutes 15    METs 2.3      NuStep   Level 2    SPM 80    Minutes 15    METs 2.3      Prescription Details   Frequency (times per week) 3    Duration Progress to 30 minutes of continuous aerobic without signs/symptoms of physical distress      Intensity   THRR 40-80% of Max Heartrate 56-113    Ratings of Perceived Exertion 11-13    Perceived Dyspnea 0-4      Progression   Progression Continue progressive overload as per policy without signs/symptoms or physical distress.      Resistance Training   Training Prescription Yes    Weight 3    Reps 10-15             Perform Capillary Blood Glucose checks as needed.  Exercise Prescription Changes:   Exercise Prescription Changes     Row Name 12/23/21 1457 01/08/22 1621 01/24/22 1600 02/05/22 1609       Response to Exercise   Blood Pressure (Admit) 122/64 128/60 110/64 114/62    Blood Pressure (Exercise) 144/72 128/68 150/62 158/76    Blood Pressure (Exit) 118/70 102/62 128/70 188/69    Heart Rate (Admit) 64 bpm 58 bpm 62 bpm 52 bpm    Heart Rate (Exercise) 92 bpm 113 bpm 115 bpm 112 bpm    Heart Rate (Exit) 63 bpm 66 bpm 71 bpm 64 bpm    Rating of Perceived Exertion (Exercise) 11 11.5 12.5 10    Symptoms None None none none    Comments Off to a great start with exercise. Reviewed MET's, goals and home ExRx Reviewed METs Reviewed METs and goals    Duration Continue with 30 min of aerobic exercise without signs/symptoms of physical distress. Continue with 30 min of aerobic exercise without signs/symptoms of physical distress. Continue with 30 min of aerobic exercise without signs/symptoms  of physical distress. Continue  with 30 min of aerobic exercise without signs/symptoms of physical distress.    Intensity THRR unchanged THRR unchanged THRR unchanged THRR unchanged      Progression   Progression Continue to progress workloads to maintain intensity without signs/symptoms of physical distress. Continue to progress workloads to maintain intensity without signs/symptoms of physical distress. Continue to progress workloads to maintain intensity without signs/symptoms of physical distress. Continue to progress workloads to maintain intensity without signs/symptoms of physical distress.    Average METs 2.6 3.75 4.45 3.9      Resistance Training   Training Prescription Yes No Yes No    Weight 3 -- 3 --    Reps 10-15 -- 10-15 --    Time 10 Minutes -- 10 Minutes --      Interval Training   Interval Training No No No No      Treadmill   MPH -- -- -- 3    Grade -- -- -- 0    Minutes -- -- -- 15    METs -- -- -- 3.3      Recumbant Bike   Level 2 3 3.5 --    RPM 60 60 60 --    Minutes _0 --    METs 2.8 3.4 4.1 --      NuStep   Level _1 SPM 96 96 96 113    Minutes _2 METs 2.5 4.1 4.8 4.5      Home Exercise Plan   Plans to continue exercise at -- [employer] (comment) -- [employer]nsic [employer] (comment)    Frequency -- Add 2 additional days to program exercise sessions. -- Add 2 additional days to program exercise sessions.    Initial Home Exercises Provided -- 01/08/22 -- 01/08/22             Exercise Comments:   Exercise Comments     Row Name 12/23/21 1559 01/08/22 1636 01/24/22 1639 02/05/22 1616     Exercise Comments Patient tolerated 1st session of exercise well without symptoms. Reviewed MET's, goals and home ExRx. Pt tolerated exercise well with an average MET level of 3.75. Pt is currently meeting ACSM guidlines 5-7 days of 30 mins or more of exercise. Pt is playing pickleball and walking on the treadmill. Pt feels good about his goals of getting back to  pickleball and gaining flexibility and stamina. Pt is progressing well in CRP2 program. Pt is tolerating an average MET level of 4.45. Will continue to monitor and progress workloads as tolerated without signs or symptoms. Reviewed MET's, and goals. Pt tolerated exercise well with an average MET level of 3.9. Pt feels good about his progress so far and is gaining stamina. Pt is very excited becasue he is back to playing pickleball and planed two games last week and he is continuing to work on his flexibilty             Exercise Goals and Review:   Exercise Goals     [place] Name 12/19/21 1325             Exercise Goals   Increase Physical Activity Yes       Intervention Provide advice, education, support and counseling about physical activity/exercise needs.;Develop an individualized exercise prescription for aerobic and resistive training based on initial evaluation findings, risk stratification, comorbidities and participant's personal goals.       Expected Outcomes Short  Term: Attend rehab on a regular basis to increase amount of physical activity.;Long Term: Add in home exercise to make exercise part of routine and to increase amount of physical activity.;Long Term: Exercising regularly at least 3-5 days a week.       Increase Strength and Stamina Yes       Intervention Provide advice, education, support and counseling about physical activity/exercise needs.;Develop an individualized exercise prescription for aerobic and resistive training based on initial evaluation findings, risk stratification, comorbidities and participant's personal goals.       Expected Outcomes Short Term: Increase workloads from initial exercise prescription for resistance, speed, and METs.;Short Term: Perform resistance training exercises routinely during rehab and add in resistance training at home;Long Term: Improve cardiorespiratory fitness, muscular endurance and strength as measured by increased METs and  functional capacity (6MWT)       Able to understand and use rate of perceived exertion (RPE) scale Yes       Intervention Provide education and explanation on how to use RPE scale       Expected Outcomes Short Term: Able to use RPE daily in rehab to express subjective intensity level;Long Term:  Able to use RPE to guide intensity level when exercising independently       Knowledge and understanding of Target Heart Rate Range (THRR) Yes       Intervention Provide education and explanation of THRR including how the numbers were predicted and where they are located for reference       Expected Outcomes Short Term: Able to state/look up THRR;Long Term: Able to use THRR to govern intensity when exercising independently;Short Term: Able to use daily as guideline for intensity in rehab                Exercise Goals Re-Evaluation :  Exercise Goals Re-Evaluation     [place] Name 12/23/21 1559 01/08/22 1626 02/05/22 1612         Exercise Goal Re-Evaluation   Exercise Goals Review Increase Physical Activity;Able to understand and use rate of perceived exertion (RPE) scale Increase Physical Activity;Able to understand and use rate of perceived exertion (RPE) scale;Knowledge and understanding of Target Heart Rate Range (THRR);Understanding of Exercise Prescription;Able to check pulse independently;Increase Strength and Stamina Increase Physical Activity;Able to understand and use rate of perceived exertion (RPE) scale;Knowledge and understanding of Target Heart Rate Range (THRR);Understanding of Exercise Prescription;Able to check pulse independently;Increase Strength and Stamina     Comments Patient able to understand and use RPE scale appropriately. Reviewed MET's, goals and home ExRx. Pt tolerated exercise well with an average MET level of 3.75. Pt is currently meeting ACSM guidlines 5-7 days of 30 mins or more of exercise. Pt is playing pickleball and walking on the treadmill. Pt feels good about his goals  of getting back to pickleball and gaining flexibility and stamina. Reviewed MET's, and goals. Pt tolerated exercise well with an average MET level of 3.9. Pt feels good about his progress so far and is gaining stamina. Pt is very excited becasue he is back to playing pickleball and planed two games last week and he is continuing to work on his flexibilty     Expected Outcomes Progress workloads as tolerated to improve cardiorespiratory fitness and stamina. Pt will exercise on his own and gain strength. Will continue to monitor pt and progress workloads as tolerated without sign or symptom Will continue to monitor pt and progress workloads as tolerated without sign or symptom  Discharge Exercise Prescription (Final Exercise Prescription Changes):  Exercise Prescription Changes - 02/05/22 1609       Response to Exercise   Blood Pressure (Admit) 114/62    Blood Pressure (Exercise) 158/76    Blood Pressure (Exit) 188/69    Heart Rate (Admit) 52 bpm    Heart Rate (Exercise) 112 bpm    Heart Rate (Exit) 64 bpm    Rating of Perceived Exertion (Exercise) 10    Symptoms none    Comments Reviewed METs and goals    Duration Continue with 30 min of aerobic exercise without signs/symptoms of physical distress.    Intensity THRR unchanged      Progression   Progression Continue to progress workloads to maintain intensity without signs/symptoms of physical distress.    Average METs 3.9      Resistance Training   Training Prescription No      Interval Training   Interval Training No      Treadmill   MPH 3    Grade 0    Minutes 15    METs 3.3      NuStep   Level 5    SPM 113    Minutes 15    METs 4.5      Home Exercise Plan   Plans to continue exercise at [facility] (comment)    Frequency Add 2 additional days to program exercise sessions.    Initial Home Exercises Provided 01/08/22             Nutrition:  Target Goals: Understanding of nutrition  guidelines, daily intake of sodium <1520m, cholesterol <2065m calories 30% from fat and 7% or less from saturated fats, daily to have 5 or more servings of fruits and vegetables.  Biometrics:  Pre Biometrics - 12/19/21 1319       Pre Biometrics   Waist Circumference 39.75 inches    Hip Circumference 39 inches    Waist to Hip Ratio 1.02 %    Triceps Skinfold 8 mm    % Body Fat 25.1 %    Grip Strength 38 kg    Flexibility --   Pt unable to reach   Single Leg Stand 30 seconds              Nutrition Therapy Plan and Nutrition Goals:  Nutrition Therapy & Goals - 01/22/22 1610       Nutrition Therapy   Diet Heart Healthy Diet    Drug/Food Interactions Statins/Certain Fruits      Personal Nutrition Goals   Nutrition Goal Patient to choose a daily variety of fruit, vegetables, whole grains, lean protein/plant protein, nonfat dairy as part of heart healthy lifestyle    Personal Goal #2 Patient to limit sodium to <150074maily    Personal Goal #3 Patient to identify food sources of trans fats, saturated fat, sodium, and refined carbohydrates    Comments Goals in progress. JimClair Gullingntinues to attend the [employer] series. He continues to work toward reduction in sodium, reduction of processed meats/saturated fat, and increasing high fiber foods including fruits and vegetables. He continues to see behavioral health counseling r/t the recent death of his brother.      Intervention Plan   Intervention Prescribe, educate and counsel regarding individualized specific dietary modifications aiming towards targeted core components such as weight, hypertension, lipid management, diabetes, heart failure and other comorbidities.;Nutrition handout(s) given to patient.    Expected Outcomes Short Term Goal: Understand basic principles of dietary content, such as  calories, fat, sodium, cholesterol and nutrients.;Long Term Goal: Adherence to prescribed nutrition plan.             Nutrition  Assessments:  Nutrition Assessments - 12/30/21 1627       Rate Your Plate Scores   Pre Score 73            MEDIFICTS Score Key: ?70 Need to make dietary changes  40-70 Heart Healthy Diet ? 40 Therapeutic Level Cholesterol Diet   Flowsheet Row INTENSIVE CARDIAC REHAB from 12/30/2021 in [place]  Picture Your Plate Total Score on Admission 73      Picture Your Plate Scores: <56 Unhealthy dietary pattern with much room for improvement. 41-50 Dietary pattern unlikely to meet recommendations for good health and room for improvement. 51-60 More healthful dietary pattern, with some room for improvement.  >60 Healthy dietary pattern, although there may be some specific behaviors that could be improved.    Nutrition Goals Re-Evaluation:  Nutrition Goals Re-Evaluation     [place] Name 12/23/21 1627 01/22/22 1610           Goals   Current Weight 177 lb (80.3 kg) 178 lb 2.1 oz (80.8 kg)      Comment Lipoprotein A 99.1. Labs from December 2021 per cardiology notes show A1c 6.3 No new labs at this time. Patient has maintained weight since starting with our program.      Expected Outcome Patient reports interest in making dietary changes and better convenience options for heart health. Goals in progress. Clair Gulling continues to attend the [place] education series. He continues to work toward reduction in sodium, reduction of processed meats/saturated fat, and increasing high fiber foods including fruits and vegetables. He continues to see behavioral health counseling r/t the recent death of his brother.               Nutrition Goals Re-Evaluation:  Nutrition Goals Re-Evaluation     [place] Name 12/23/21 1627 01/22/22 1610           Goals   Current Weight 177 lb (80.3 kg) 178 lb 2.1 oz (80.8 kg)      Comment Lipoprotein A 99.1. Labs from December 2021 per cardiology notes show A1c 6.3 No new labs at this time. Patient has maintained weight since starting  with our program.      Expected Outcome Patient reports interest in making dietary changes and better convenience options for heart health. Goals in progress. Clair Gulling continues to attend the [employer] education series. He continues to work toward reduction in sodium, reduction of processed meats/saturated fat, and increasing high fiber foods including fruits and vegetables. He continues to see behavioral health counseling r/t the recent death of his brother.               Nutrition Goals Discharge (Final Nutrition Goals Re-Evaluation):  Nutrition Goals Re-Evaluation - 01/22/22 1610       Goals   Current Weight 178 lb 2.1 oz (80.8 kg)    Comment No new labs at this time. Patient has maintained weight since starting with our program.    Expected Outcome Goals in progress. Clair Gulling continues to attend the [place] education series. He continues to work toward reduction in sodium, reduction of processed meats/saturated fat, and increasing high fiber foods including fruits and vegetables. He continues to see behavioral health counseling r/t the recent death of his brother.             Psychosocial: Target Goals:  Acknowledge presence or absence of significant depression and/or stress, maximize coping skills, provide positive support system. Participant is able to verbalize types and ability to use techniques and skills needed for reducing stress and depression.  Initial Review & Psychosocial Screening:  Initial Psych Review & Screening - 12/19/21 1102       Initial Review   Current issues with Current Depression;History of Depression      Family Dynamics   Good Support System? Yes    Comments Imran who has a history of and current depression is dealing with the recent loss of his older brother.  Gunnar feels supported by his girlfriend.  lucio is currently on a anti-depressent medication -Trintellix is working well for him.  Sees couselor for several years every other week      Barriers    Psychosocial barriers to participate in program The patient should benefit from training in stress management and relaxation.      Screening Interventions   Interventions Encouraged to exercise;Provide feedback about the scores to participant    Expected Outcomes Short Term goal: Identification and review with participant of any Quality of Life or Depression concerns found by scoring the questionnaire.;Long Term goal: The participant improves quality of Life and PHQ9 Scores as seen by post scores and/or verbalization of changes             Quality of Life Scores:  Quality of Life - 12/19/21 1327       Quality of Life   Select Quality of Life      Quality of Life Scores   Health/Function Pre 22.13 %    Socioeconomic Pre 25 %    Psych/Spiritual Pre 18.33 %    Family Pre 22.1 %    GLOBAL Pre 22.05 %            Scores of 19 and below usually indicate a poorer quality of life in these areas.  A difference of  2-3 points is a clinically meaningful difference.  A difference of 2-3 points in the total score of the Quality of Life Index has been associated with significant improvement in overall quality of life, self-image, physical symptoms, and general health in studies assessing change in quality of life.  PHQ-9: Review Flowsheet       12/19/2021  Depression screen PHQ 2/9  Decreased Interest 1  Down, Depressed, Hopeless 1  PHQ - 2 Score 2  Altered sleeping 1  Tired, decreased energy 2  Change in appetite 1  Feeling bad or failure about yourself  0  Trouble concentrating 0  Moving slowly or fidgety/restless 0  Suicidal thoughts 0  PHQ-9 Score 6  Difficult doing work/chores Not difficult at all   Interpretation of Total Score  Total Score Depression Severity:  1-4 = Minimal depression, 5-9 = Mild depression, 10-14 = Moderate depression, 15-19 = Moderately severe depression, 20-27 = Severe depression   Psychosocial Evaluation and Intervention:   Psychosocial  Re-Evaluation:  Psychosocial Re-Evaluation     [place] Name 12/23/21 1721 01/22/22 0630 02/12/22 0951         Psychosocial Re-Evaluation   Current issues with Current Depression;History of Depression Current Depression;History of Depression Current Depression;History of Depression     Comments Clair Gulling denied having any increased depression or stress on his first day of exercise. Will review quality of life in the upcoming week Quality of life reviewed. Brother recently passed away. Emotional support provided. Clair Gulling has not voiced any further concerns or stressors at cardiac  rehab.     Expected Outcomes JIm will have decreased on controlled depression upon completion of intensive cardiac rehab. JIm will have decreased on controlled depression upon completion of intensive cardiac rehab. JIm will have decreased on controlled depression upon completion of intensive cardiac rehab.     Interventions Encouraged to attend Cardiac Rehabilitation for the exercise;Relaxation education;Stress management education Encouraged to attend Cardiac Rehabilitation for the exercise;Relaxation education;Stress management education Encouraged to attend Cardiac Rehabilitation for the exercise;Relaxation education;Stress management education     Continue Psychosocial Services  Follow up required by staff Follow up required by staff No Follow up required              Psychosocial Discharge (Final Psychosocial Re-Evaluation):  Psychosocial Re-Evaluation - 02/12/22 0951       Psychosocial Re-Evaluation   Current issues with Current Depression;History of Depression    Comments Clair Gulling has not voiced any further concerns or stressors at cardiac rehab.    Expected Outcomes JIm will have decreased on controlled depression upon completion of intensive cardiac rehab.    Interventions Encouraged to attend Cardiac Rehabilitation for the exercise;Relaxation education;Stress management education    Continue Psychosocial Services  No  Follow up required             Vocational Rehabilitation: Provide vocational rehab assistance to qualifying candidates.   Vocational Rehab Evaluation & Intervention:  Vocational Rehab - 12/19/21 1048       Initial Vocational Rehab Evaluation & Intervention   Assessment shows need for Vocational Rehabilitation No      Vocational Rehab Re-Evaulation   Comments retired, no vocational rehab needs             Education: Education Goals: Education classes will be provided on a weekly basis, covering required topics. Participant will state understanding/return demonstration of topics presented.    Education     Row Name 12/23/21 1500     Education   Cardiac Education Topics Pritikin   Environmental consultant Psychosocial   Psychosocial Workshop Recognizing and Reducing Stress   Instruction Review Code 1- Verbalizes Understanding   Class Start Time 1400   Class Stop Time 1447   Class Time Calculation (min) 47 min    [place] Name 12/25/21 1600     Education   Cardiac Education Topics Pritikin   Financial trader   Weekly Topic Fast and Healthy Breakfasts   Instruction Review Code 1- Verbalizes Understanding   Class Start Time 1400   Class Stop Time 1500   Class Time Calculation (min) 60 min    [place] Name 12/27/21 1500     Education   Cardiac Education Topics Pritikin   Lexicographer Nutrition   Nutrition Overview of the [place]   Instruction Review Code 1- Verbalizes Understanding   Class Start Time 1400   Class Stop Time 1453   Class Time Calculation (min) 53 min    [place] Name 12/30/21 1600     Workshops   Educator Exercise Printmaker Exercise   Exercise Workshop Hotel manager and Fall Prevention   Instruction Review Code 1- Verbalizes Understanding   Class Start Time 1400   Class  Stop Time 1455   Class Time Calculation (min) 55 min    [place] Name 01/01/22 1500  Education   Cardiac Education Topics [employer]   Educator Dietitian   Weekly Topic Personalizing Your Pritikin Plate   Instruction Review Code 1- Verbalizes Understanding   Class Start Time 1355   Class Stop Time 1445   Class Time Calculation (min) 50 min    [place] Name 01/03/22 1500     Education   Cardiac Education Topics Pritikin   Scientist, research (life sciences)   Educator Nurse   Select Psychosocial   Psychosocial Healthy Minds, Bodies, Hearts   Instruction Review Code 1- Verbalizes Understanding   Class Start Time 1357   Class Stop Time 1433   Class Time Calculation (min) 36 min    [place] Name 01/08/22 1600     Education   Cardiac Education Topics [employer]   Educator Dietitian   Weekly Topic Tasty Appetizers and Snacks   Instruction Review Code 1- Verbalizes Understanding   Class Start Time 1400   Class Stop Time 1455   Class Time Calculation (min) 55 min    [place] Name 01/10/22 0900     Education   Cardiac Education Topics Pritikin   Select Core Videos     Core Videos   Educator Dietitian   Select Nutrition   Nutrition Other  Label Reading   Instruction Review Code 1- Verbalizes Understanding   Class Start Time [phone]   Class Stop Time 0856   Class Time Calculation (min) 44 min    [place] Name 01/13/22 1400     Education   Cardiac Education Topics Pritikin   Scientist, research (life sciences)   Educator Dietitian   Select Nutrition   Nutrition Calorie Density   Instruction Review Code 1- Verbalizes Understanding   Class Start Time 1140   Class Stop Time 1230   Class Time Calculation (min) 50 min    [place] Name 01/13/22 1500     Education   Cardiac Education Topics --    [place] Name 01/15/22 1500     Education   Cardiac Education Topics [employer]   Educator Dietitian   Weekly Topic [employer] Desserts   Instruction Review Code 1- Verbalizes Understanding   Class Start Time 1355   Class Stop Time 1445   Class Time Calculation (min) 50 min    [place] Name 01/17/22 1500     Education   Cardiac Education Topics Pritikin   Select Workshops     Workshops   Educator Exercise Physiologist   Select Psychosocial   Psychosocial Workshop Managing Moods and Relationships   Instruction Review Code 1- Verbalizes Understanding   Class Start Time 1352   Class Stop Time 1457   Class Time Calculation (min) 65 min    [place] Name 01/20/22 1500     Education   Cardiac Education Topics [place]   Select Workshops     Workshops   Educator Exercise Physiologist   Select Exercise   Exercise Workshop Exercise Basics: Building Your Action Plan   Instruction Review Code 1- Verbalizes Understanding   Class Start Time 1411   Class Stop Time 1451   Class Time Calculation (min) 40 min    [place] Name 01/24/22 1500     Education   Cardiac Education Topics Pritikin   Administrator, sports  Core Videos   Educator Exercise Printmaker Exercise Education   Exercise Education Move It!   Instruction Review Code 1- Verbalizes Understanding   Class Start Time 1408   Class Stop Time 1453   Class Time Calculation (min) 45 min    [place] Name 01/27/22 1500     Education   Cardiac Education Topics Pritikin   Nurse, children's   Select Nutrition   Nutrition Nutrition Action Plan   Instruction Review Code 1- Verbalizes Understanding   Class Start Time 1400   Class Stop Time 1450   Class Time Calculation (min) 50 min    [place] Name 01/29/22 1400     Education   Cardiac Education Topics Pritikin   Financial trader   Weekly Topic Simple Sides and Sauces   Instruction Review Code 1- Verbalizes Understanding   Class Start Time 1359   Class Stop  Time 1440   Class Time Calculation (min) 41 min    [place] Name 01/31/22 1500     Education   Cardiac Education Topics Pritikin   Tax inspector General Education   General Education Hypertension and Heart Disease   Instruction Review Code 1- Verbalizes Understanding   Class Start Time 1400   Class Stop Time 1450   Class Time Calculation (min) 50 min    [place] Name 02/03/22 1500     Education   Cardiac Education Topics Pritikin   Lexicographer Nutrition   Nutrition Dining Out - Part 1   Instruction Review Code 1- Verbalizes Understanding   Class Start Time 1358   Class Stop Time 1445   Class Time Calculation (min) 47 min    [place] Name 02/05/22 1500     Education   Cardiac Education Topics Pritikin   Financial trader   Weekly Topic One-Pot Wonders   Instruction Review Code 1- Verbalizes Understanding   Class Start Time 4765   Class Stop Time 1440   Class Time Calculation (min) 53 min    [place] Name 02/07/22 1500     Education   Cardiac Education Topics Pritikin   Environmental consultant Psychosocial   Psychosocial Workshop New Thoughts, New Behaviors   Instruction Review Code 1- Verbalizes Understanding   Class Start Time 55   Class Stop Time 1447   Class Time Calculation (min) 52 min    [place] Name 02/10/22 1500     Education   Cardiac Education Topics Pritikin   Academic librarian Exercise Education   Exercise Education Biomechanial Limitations   Instruction Review Code 1- Verbalizes Understanding   Class Start Time 1350   Class Stop Time 1446   Class Time Calculation (min) 56 min            Core Videos: Exercise    Move It!  Clinical staff conducted group or individual video education with verbal  and written material and guidebook.  Patient learns the recommended Pritikin exercise program. Exercise with the goal of living a long, healthy life.  Some of the health benefits of exercise include controlled diabetes, healthier blood pressure levels, improved cholesterol levels, improved heart and lung capacity, improved sleep, and better body composition. Everyone should speak with their doctor before starting or changing an exercise routine.  Biomechanical Limitations Clinical staff conducted group or individual video education with verbal and written material and guidebook.  Patient learns how biomechanical limitations can impact exercise and how we can mitigate and possibly overcome limitations to have an impactful and balanced exercise routine.  Body Composition Clinical staff conducted group or individual video education with verbal and written material and guidebook.  Patient learns that body composition (ratio of muscle mass to fat mass) is a key component to assessing overall fitness, rather than body weight alone. Increased fat mass, especially visceral belly fat, can put [place] at increased risk for metabolic syndrome, type 2 diabetes, heart disease, and even death. It is recommended to combine diet and exercise (cardiovascular and resistance training) to improve your body composition. Seek guidance from your physician and exercise physiologist before implementing an exercise routine.  Exercise Action Plan Clinical staff conducted group or individual video education with verbal and written material and guidebook.  Patient learns the recommended strategies to achieve and enjoy long-term exercise adherence, including variety, self-motivation, self-efficacy, and positive decision making. Benefits of exercise include fitness, good health, weight management, more energy, better sleep, less stress, and overall well-being.  Medical   Heart Disease Risk Reduction Clinical staff conducted group or  individual video education with verbal and written material and guidebook.  Patient learns our heart is our most vital organ as it circulates oxygen, nutrients, white blood cells, and hormones throughout the entire body, and carries waste away. Data supports a plant-based eating plan like the Pritikin Program for its effectiveness in slowing progression of and reversing heart disease. The video provides a number of recommendations to address heart disease.   Metabolic Syndrome and Belly Fat  Clinical staff conducted group or individual video education with verbal and written material and guidebook.  Patient learns what metabolic syndrome is, how it leads to heart disease, and how one can reverse it and keep it from coming back. You have metabolic syndrome if you have 3 of the following 5 criteria: abdominal obesity, high blood pressure, high triglycerides, low HDL cholesterol, and high blood sugar.  Hypertension and Heart Disease Clinical staff conducted group or individual video education with verbal and written material and guidebook.  Patient learns that high blood pressure, or hypertension, is very common in the [place]. Hypertension is largely due to excessive salt intake, but other important risk factors include being overweight, physical inactivity, drinking too much alcohol, smoking, and not eating enough potassium from fruits and vegetables. High blood pressure is a leading risk factor for heart attack, stroke, congestive heart failure, dementia, kidney failure, and premature death. Long-term effects of excessive salt intake include stiffening of the arteries and thickening of heart muscle and organ damage. Recommendations include ways to reduce hypertension and the risk of heart disease.  Diseases of Our Time - Focusing on Diabetes Clinical staff conducted group or individual video education with verbal and written material and guidebook.  Patient learns why the best way to stop diseases  of our time is prevention, through food and other lifestyle changes. Medicine (such as prescription pills and surgeries) is often only a Band-Aid on the problem, not a long-term solution. Most common diseases of our time include obesity, type 2 diabetes, hypertension, heart disease, and  cancer. The Pritikin Program is recommended and has been proven to help reduce, reverse, and/or prevent the damaging effects of metabolic syndrome.  Nutrition   Overview of the Pritikin Eating Plan  Clinical staff conducted group or individual video education with verbal and written material and guidebook.  Patient learns about the [place] for disease risk reduction. The [place] emphasizes a wide variety of unrefined, minimally-processed carbohydrates, like fruits, vegetables, whole grains, and legumes. Go, Caution, and Stop food choices are explained. Plant-based and lean animal proteins are emphasized. Rationale provided for low sodium intake for blood pressure control, low added sugars for blood sugar stabilization, and low added fats and oils for coronary artery disease risk reduction and weight management.  Calorie Density  Clinical staff conducted group or individual video education with verbal and written material and guidebook.  Patient learns about calorie density and how it impacts the Pritikin Eating Plan. Knowing the characteristics of the food you choose will help you decide whether those foods will lead to weight gain or weight loss, and whether you want to consume more or less of them. Weight loss is usually a side effect of the Pritikin Eating Plan because of its focus on low calorie-dense foods.  Label Reading  Clinical staff conducted group or individual video education with verbal and written material and guidebook.  Patient learns about the Pritikin recommended label reading guidelines and corresponding recommendations regarding calorie density, added sugars, sodium content,  and whole grains.  Dining Out - Part 1  Clinical staff conducted group or individual video education with verbal and written material and guidebook.  Patient learns that restaurant meals can be sabotaging because they can be so high in calories, fat, sodium, and/or sugar. Patient learns recommended strategies on how to positively address this and avoid unhealthy pitfalls.  Facts on Fats  Clinical staff conducted group or individual video education with verbal and written material and guidebook.  Patient learns that lifestyle modifications can be just as effective, if not more so, as many medications for lowering your risk of heart disease. A Pritikin lifestyle can help to reduce your risk of inflammation and atherosclerosis (cholesterol build-up, or plaque, in the artery walls). Lifestyle interventions such as dietary choices and physical activity address the cause of atherosclerosis. A review of the types of fats and their impact on blood cholesterol levels, along with dietary recommendations to reduce fat intake is also included.  Nutrition Action Plan  Clinical staff conducted group or individual video education with verbal and written material and guidebook.  Patient learns how to incorporate Pritikin recommendations into their lifestyle. Recommendations include planning and keeping personal health goals in mind as an important part of their success.  Healthy Mind-Set    Healthy Minds, Bodies, Hearts  Clinical staff conducted group or individual video education with verbal and written material and guidebook.  Patient learns how to identify when they are stressed. Video will discuss the impact of that stress, as well as the many benefits of stress management. Patient will also be introduced to stress management techniques. The way we think, act, and feel has an impact on our hearts.  How Our Thoughts Can Heal Our Hearts  Clinical staff conducted group or individual video education with verbal  and written material and guidebook.  Patient learns that negative thoughts can cause depression and anxiety. This can result in negative lifestyle behavior and serious health problems. Cognitive behavioral therapy is an effective method to help control our thoughts  in order to change and improve our emotional outlook.  Additional Videos:  Exercise    Improving Performance  Clinical staff conducted group or individual video education with verbal and written material and guidebook.  Patient learns to use a non-linear approach by alternating intensity levels and lengths of time spent exercising to help burn more calories and lose more body fat. Cardiovascular exercise helps improve heart health, metabolism, hormonal balance, blood sugar control, and recovery from fatigue. Resistance training improves strength, endurance, balance, coordination, reaction time, metabolism, and muscle mass. Flexibility exercise improves circulation, posture, and balance. Seek guidance from your physician and exercise physiologist before implementing an exercise routine and learn your capabilities and proper form for all exercise.  Introduction to Yoga  Clinical staff conducted group or individual video education with verbal and written material and guidebook.  Patient learns about yoga, a discipline of the coming together of mind, breath, and body. The benefits of yoga include improved flexibility, improved range of motion, better posture and core strength, increased lung function, weight loss, and positive self-image. Yoga's heart health benefits include lowered blood pressure, healthier heart rate, decreased cholesterol and triglyceride levels, improved immune function, and reduced stress. Seek guidance from your physician and exercise physiologist before implementing an exercise routine and learn your capabilities and proper form for all exercise.  Medical   Aging: Enhancing Your Quality of Life  Clinical staff conducted  group or individual video education with verbal and written material and guidebook.  Patient learns key strategies and recommendations to stay in good physical health and enhance quality of life, such as prevention strategies, having an advocate, securing a [place], and keeping a list of medications and system for tracking them. It also discusses how to avoid risk for bone loss.  Biology of Weight Control  Clinical staff conducted group or individual video education with verbal and written material and guidebook.  Patient learns that weight gain occurs because we consume more calories than we burn (eating more, moving less). Even if your body weight is normal, you may have higher ratios of fat compared to muscle mass. Too much body fat puts you at increased risk for cardiovascular disease, heart attack, stroke, type 2 diabetes, and obesity-related cancers. In addition to exercise, following the [place] can help reduce your risk.  Decoding Lab Results  Clinical staff conducted group or individual video education with verbal and written material and guidebook.  Patient learns that lab test reflects one measurement whose values change over time and are influenced by many factors, including medication, stress, sleep, exercise, food, hydration, pre-existing medical conditions, and more. It is recommended to use the knowledge from this video to become more involved with your lab results and evaluate your numbers to speak with your doctor.   Diseases of Our Time - Overview  Clinical staff conducted group or individual video education with verbal and written material and guidebook.  Patient learns that according to the CDC, 50% to 70% of chronic diseases (such as obesity, type 2 diabetes, elevated lipids, hypertension, and heart disease) are avoidable through lifestyle improvements including healthier food choices, listening to satiety cues, and increased physical  activity.  Sleep Disorders Clinical staff conducted group or individual video education with verbal and written material and guidebook.  Patient learns how good quality and duration of sleep are important to overall health and well-being. Patient also learns about sleep disorders and how they impact health along with recommendations to address  them, including discussing with a physician.  Nutrition  Dining Out - Part 2 Clinical staff conducted group or individual video education with verbal and written material and guidebook.  Patient learns how to plan ahead and communicate in order to maximize their dining experience in a healthy and nutritious manner. Included are recommended food choices based on the type of restaurant the patient is visiting.   Fueling a Best boy conducted group or individual video education with verbal and written material and guidebook.  There is a strong connection between our food choices and our health. Diseases like obesity and type 2 diabetes are very prevalent and are in large-part due to lifestyle choices. The Pritikin Eating Plan provides plenty of food and hunger-curbing satisfaction. It is easy to follow, affordable, and helps reduce health risks.  Menu Workshop  Clinical staff conducted group or individual video education with verbal and written material and guidebook.  Patient learns that restaurant meals can sabotage health goals because they are often packed with calories, fat, sodium, and sugar. Recommendations include strategies to plan ahead and to communicate with the manager, chef, or server to help order a healthier meal.  Planning Your Eating Strategy  Clinical staff conducted group or individual video education with verbal and written material and guidebook.  Patient learns about the [place] and its benefit of reducing the risk of disease. The [place] does not focus on calories. Instead, it emphasizes  high-quality, nutrient-rich foods. By knowing the characteristics of the foods, we choose, we can determine their calorie density and make informed decisions.  Targeting Your Nutrition Priorities  Clinical staff conducted group or individual video education with verbal and written material and guidebook.  Patient learns that lifestyle habits have a tremendous impact on disease risk and progression. This video provides eating and physical activity recommendations based on your personal health goals, such as reducing LDL cholesterol, losing weight, preventing or controlling type 2 diabetes, and reducing high blood pressure.  Vitamins and Minerals  Clinical staff conducted group or individual video education with verbal and written material and guidebook.  Patient learns different ways to obtain key vitamins and minerals, including through a recommended healthy diet. It is important to discuss all supplements you take with your doctor.   Healthy Mind-Set    Smoking Cessation  Clinical staff conducted group or individual video education with verbal and written material and guidebook.  Patient learns that cigarette smoking and tobacco addiction pose a serious health risk which affects millions of people. Stopping smoking will significantly reduce the risk of heart disease, lung disease, and many forms of cancer. Recommended strategies for quitting are covered, including working with your doctor to develop a successful plan.  Culinary   Becoming a Financial trader conducted group or individual video education with verbal and written material and guidebook.  Patient learns that cooking at home can be healthy, cost-effective, quick, and puts them in control. Keys to cooking healthy recipes will include looking at your recipe, assessing your equipment needs, planning ahead, making it simple, choosing cost-effective seasonal ingredients, and limiting the use of added fats, salts, and  sugars.  Cooking - Breakfast and Snacks  Clinical staff conducted group or individual video education with verbal and written material and guidebook.  Patient learns how important breakfast is to satiety and nutrition through the entire day. Recommendations include key foods to eat during breakfast to help stabilize blood sugar levels and to prevent overeating  at meals later in the day. Planning ahead is also a key component.  Cooking - Human resources officer conducted group or individual video education with verbal and written material and guidebook.  Patient learns eating strategies to improve overall health, including an approach to cook more at home. Recommendations include thinking of animal protein as a side on your plate rather than center stage and focusing instead on lower calorie dense options like vegetables, fruits, whole grains, and plant-based proteins, such as beans. Making sauces in large quantities to freeze for later and leaving the skin on your vegetables are also recommended to maximize your experience.  Cooking - Healthy Salads and Dressing Clinical staff conducted group or individual video education with verbal and written material and guidebook.  Patient learns that vegetables, fruits, whole grains, and legumes are the foundations of the [place]. Recommendations include how to incorporate each of these in flavorful and healthy salads, and how to create homemade salad dressings. Proper handling of ingredients is also covered. Cooking - Soups and [employer] - Soups and Desserts Clinical staff conducted group or individual video education with verbal and written material and guidebook.  Patient learns that Pritikin soups and desserts make for easy, nutritious, and delicious snacks and meal components that are low in sodium, fat, sugar, and calorie density, while high in vitamins, minerals, and filling fiber. Recommendations include simple and healthy  ideas for soups and desserts.   Overview     The Pritikin Solution Program Overview Clinical staff conducted group or individual video education with verbal and written material and guidebook.  Patient learns that the results of the [place] Program have been documented in more than 100 articles published in peer-reviewed journals, and the benefits include reducing risk factors for (and, in some cases, even reversing) high cholesterol, high blood pressure, type 2 diabetes, obesity, and more! An overview of the three key pillars of the Pritikin Program will be covered: eating well, doing regular exercise, and having a healthy mind-set.  WORKSHOPS  Exercise: Exercise Basics: Building Your Action Plan Clinical staff led group instruction and group discussion with PowerPoint presentation and patient guidebook. To enhance the learning environment the use of posters, models and videos may be added. At the conclusion of this workshop, patients will comprehend the difference between physical activity and exercise, as well as the benefits of incorporating both, into their routine. Patients will understand the FITT (Frequency, Intensity, Time, and Type) principle and how to use it to build an exercise action plan. In addition, safety concerns and other considerations for exercise and cardiac rehab will be addressed by the presenter. The purpose of this lesson is to promote a comprehensive and effective weekly exercise routine in order to improve patients' overall level of fitness.   Managing Heart Disease: Your Path to a Healthier Heart Clinical staff led group instruction and group discussion with PowerPoint presentation and patient guidebook. To enhance the learning environment the use of posters, models and videos may be added.At the conclusion of this workshop, patients will understand the anatomy and physiology of the heart. Additionally, they will understand how Pritikin's three pillars impact the  risk factors, the progression, and the management of heart disease.  The purpose of this lesson is to provide a high-level overview of the heart, heart disease, and how the Pritikin lifestyle positively impacts risk factors.  Exercise Biomechanics Clinical staff led group instruction and group discussion with PowerPoint presentation and patient guidebook. To enhance the  learning environment the use of posters, models and videos may be added. Patients will learn how the structural parts of their bodies function and how these functions impact their daily activities, movement, and exercise. Patients will learn how to promote a neutral spine, learn how to manage pain, and identify ways to improve their physical movement in order to promote healthy living. The purpose of this lesson is to expose patients to common physical limitations that impact physical activity. Participants will learn practical ways to adapt and manage aches and pains, and to minimize their effect on regular exercise. Patients will learn how to maintain good posture while sitting, walking, and lifting.  Balance Training and Fall Prevention  Clinical staff led group instruction and group discussion with PowerPoint presentation and patient guidebook. To enhance the learning environment the use of posters, models and videos may be added. At the conclusion of this workshop, patients will understand the importance of their sensorimotor skills (vision, proprioception, and the vestibular system) in maintaining their ability to balance as they age. Patients will apply a variety of balancing exercises that are appropriate for their current level of function. Patients will understand the common causes for poor balance, possible solutions to these problems, and ways to modify their physical environment in order to minimize their fall risk. The purpose of this lesson is to teach patients about the importance of maintaining balance as they age  and ways to minimize their risk of falling.  WORKSHOPS   Nutrition:  Fueling a Scientist, research (physical sciences) led group instruction and group discussion with PowerPoint presentation and patient guidebook. To enhance the learning environment the use of posters, models and videos may be added. Patients will review the foundational principles of the [place] and understand what constitutes a serving size in each of the food groups. Patients will also learn Pritikin-friendly foods that are better choices when away from home and review make-ahead meal and snack options. Calorie density will be reviewed and applied to three nutrition priorities: weight maintenance, weight loss, and weight gain. The purpose of this lesson is to reinforce (in a group setting) the key concepts around what patients are recommended to eat and how to apply these guidelines when away from home by planning and selecting Pritikin-friendly options. Patients will understand how calorie density may be adjusted for different weight management goals.  Mindful Eating  Clinical staff led group instruction and group discussion with PowerPoint presentation and patient guidebook. To enhance the learning environment the use of posters, models and videos may be added. Patients will briefly review the concepts of the [place] and the importance of low-calorie dense foods. The concept of mindful eating will be introduced as well as the importance of paying attention to internal hunger signals. Triggers for non-hunger eating and techniques for dealing with triggers will be explored. The purpose of this lesson is to provide patients with the opportunity to review the basic principles of the [place], discuss the value of eating mindfully and how to measure internal cues of hunger and fullness using the Hunger Scale. Patients will also discuss reasons for non-hunger eating and learn strategies to use for controlling  emotional eating.  Targeting Your Nutrition Priorities Clinical staff led group instruction and group discussion with PowerPoint presentation and patient guidebook. To enhance the learning environment the use of posters, models and videos may be added. Patients will learn how to determine their genetic susceptibility to disease by reviewing their family history. Patients will  gain insight into the importance of diet as part of an overall healthy lifestyle in mitigating the impact of genetics and other environmental insults. The purpose of this lesson is to provide patients with the opportunity to assess their personal nutrition priorities by looking at their family history, their own health history and current risk factors. Patients will also be able to discuss ways of prioritizing and modifying the [place] for their highest risk areas  Menu  Clinical staff led group instruction and group discussion with PowerPoint presentation and patient guidebook. To enhance the learning environment the use of posters, models and videos may be added. Using menus brought in from [employer], or printed from [employer], patients will apply the [place] dining out guidelines that were presented in the [employer] video. Patients will also be able to practice these guidelines in a variety of provided scenarios. The purpose of this lesson is to provide patients with the opportunity to practice hands-on learning of the [place] with actual menus and practice scenarios.  Label Reading Clinical staff led group instruction and group discussion with PowerPoint presentation and patient guidebook. To enhance the learning environment the use of posters, models and videos may be added. Patients will review and discuss the Pritikin label reading guidelines presented in Pritikin's Label Reading Educational series video. Using fool labels brought in from local grocery stores  and markets, patients will apply the label reading guidelines and determine if the packaged food meet the Pritikin guidelines. The purpose of this lesson is to provide patients with the opportunity to review, discuss, and practice hands-on learning of the Pritikin Label Reading guidelines with actual packaged food labels. [place] Workshops are designed to teach patients ways to prepare quick, simple, and affordable recipes at home. The importance of nutrition's role in chronic disease risk reduction is reflected in its emphasis in the overall Pritikin program. By learning how to prepare essential core Pritikin Eating Plan recipes, patients will increase control over what they eat; be able to customize the flavor of foods without the use of added salt, sugar, or fat; and improve the quality of the food they consume. By learning a set of core recipes which are easily assembled, quickly prepared, and affordable, patients are more likely to prepare more healthy foods at home. These workshops focus on convenient breakfasts, simple entres, side dishes, and desserts which can be prepared with minimal effort and are consistent with nutrition recommendations for cardiovascular risk reduction. Cooking [employer] are taught by a Engineer, materials (RD) who has been trained by the [employer]. The chef or RD has a clear understanding of the importance of minimizing - if not completely eliminating - added fat, sugar, and sodium in recipes. Throughout the series of [place] Workshop sessions, patients will learn about healthy ingredients and efficient methods of cooking to build confidence in their capability to prepare    Cooking School weekly topics:  Adding Flavor- Sodium-Free  Fast and Healthy Breakfasts  Powerhouse Plant-Based Proteins  Satisfying Salads and Dressings  Simple Sides and Sauces  International Cuisine-Spotlight on the [place]  Zones  Delicious Desserts  Savory Soups  Efficiency Cooking - Meals in a Agricultural consultant Appetizers and Snacks  Comforting Weekend Breakfasts  One-Pot Wonders   Fast Evening Meals  Easy [place] (Psychosocial): New Thoughts, New Behaviors Clinical staff led group  instruction and group discussion with PowerPoint presentation and patient guidebook. To enhance the learning environment the use of posters, models and videos may be added. Patients will learn and practice techniques for developing effective health and lifestyle goals. Patients will be able to effectively apply the goal setting process learned to develop at least one new personal goal.  The purpose of this lesson is to expose patients to a new skill set of behavior modification techniques such as techniques setting SMART goals, overcoming barriers, and achieving new thoughts and new behaviors.  Managing Moods and Relationships Clinical staff led group instruction and group discussion with PowerPoint presentation and patient guidebook. To enhance the learning environment the use of posters, models and videos may be added. Patients will learn how emotional and chronic stress factors can impact their health and relationships. They will learn healthy ways to manage their moods and utilize positive coping mechanisms. In addition, ICR patients will learn ways to improve communication skills. The purpose of this lesson is to expose patients to ways of understanding how one's mood and health are intimately connected. Developing a healthy outlook can help build positive relationships and connections with others. Patients will understand the importance of utilizing effective communication skills that include actively listening and being heard. They will learn and understand the importance of the "4 Cs" and especially Connections in fostering of a Healthy Mind-Set.  Healthy Sleep for  a Healthy Heart Clinical staff led group instruction and group discussion with PowerPoint presentation and patient guidebook. To enhance the learning environment the use of posters, models and videos may be added. At the conclusion of this workshop, patients will be able to demonstrate knowledge of the importance of sleep to overall health, well-being, and quality of life. They will understand the symptoms of, and treatments for, common sleep disorders. Patients will also be able to identify daytime and nighttime behaviors which impact sleep, and they will be able to apply these tools to help manage sleep-related challenges. The purpose of this lesson is to provide patients with a general overview of sleep and outline the importance of quality sleep. Patients will learn about a few of the most common sleep disorders. Patients will also be introduced to the concept of "sleep hygiene," and discover ways to self-manage certain sleeping problems through simple daily behavior changes. Finally, the workshop will motivate patients by clarifying the links between quality sleep and their goals of heart-healthy living.   Recognizing and Reducing Stress Clinical staff led group instruction and group discussion with PowerPoint presentation and patient guidebook. To enhance the learning environment the use of posters, models and videos may be added. At the conclusion of this workshop, patients will be able to understand the types of stress reactions, differentiate between acute and chronic stress, and recognize the impact that chronic stress has on their health. They will also be able to apply different coping mechanisms, such as reframing negative self-talk. Patients will have the opportunity to practice a variety of stress management techniques, such as deep abdominal breathing, progressive muscle relaxation, and/or guided imagery.  The purpose of this lesson is to educate patients on the role of stress in their lives and  to provide healthy techniques for coping with it.  Learning Barriers/Preferences:  Learning Barriers/Preferences - 12/19/21 1328       Learning Barriers/Preferences   Learning Barriers Hearing;Sight   pt wears glasses   Learning Preferences Written Material;Skilled Demonstration;Individual Instruction;Group Instruction;Pictoral  Education Topics:  Knowledge Questionnaire Score:  Knowledge Questionnaire Score - 12/19/21 1329       Knowledge Questionnaire Score   Pre Score 21/24             Core Components/Risk Factors/Patient Goals at Admission:  Personal Goals and Risk Factors at Admission - 12/19/21 1048       Core Components/Risk Factors/Patient Goals on Admission    Weight Management Weight Maintenance;Yes    Intervention Weight Management: Develop a combined nutrition and exercise program designed to reach desired caloric intake, while maintaining appropriate intake of nutrient and fiber, sodium and fats, and appropriate energy expenditure required for the weight goal.;Weight Management: Provide education and appropriate resources to help participant work on and attain dietary goals.    Admit Weight 178 lb 5.6 oz (80.9 kg)    Expected Outcomes Long Term: Adherence to nutrition and physical activity/exercise program aimed toward attainment of established weight goal;Weight Maintenance: Understanding of the daily nutrition guidelines, which includes 25-35% calories from fat, 7% or less cal from saturated fats, less than 266m cholesterol, less than 1.5gm of sodium, & 5 or more servings of fruits and vegetables daily;Understanding recommendations for meals to include 15-35% energy as protein, 25-35% energy from fat, 35-60% energy from carbohydrates, less than 2061mof dietary cholesterol, 20-35 gm of total fiber daily    Hypertension Yes    Intervention Provide education on lifestyle modifcations including regular physical activity/exercise, weight management,  moderate sodium restriction and increased consumption of fresh fruit, vegetables, and low fat dairy, alcohol moderation, and smoking cessation.;Monitor prescription use compliance.    Expected Outcomes Short Term: Continued assessment and intervention until BP is < 140/9032mG in hypertensive participants. < 130/64m21m in hypertensive participants with diabetes, heart failure or chronic kidney disease.;Long Term: Maintenance of blood pressure at goal levels.    Lipids Yes    Intervention Provide education and support for participant on nutrition & aerobic/resistive exercise along with prescribed medications to achieve LDL <70mg67mL >40mg.33mExpected Outcomes Short Term: Participant states understanding of desired cholesterol values and is compliant with medications prescribed. Participant is following exercise prescription and nutrition guidelines.;Long Term: Cholesterol controlled with medications as prescribed, with individualized exercise RX and with personalized nutrition plan. Value goals: LDL < 70mg, 68m> 40 mg.    Personal Goal Rathana dZakees [facility] as well as mental well being    Intervention Provide support and encouragement to increase physical activity.  Strongly encourage consistent home exercise and routine.    Expected Outcomes [place]zo [place] to cardiac rehab staff he has return to playing pickleball with no complaints or issues             Core Components/Risk Factors/Patient Goals Review:   Goals and Risk Factor Review     Row Name 12/23/21 1723 01/22/22 0835 02/12/22 0952         Core Components/Risk Factors/Patient Goals Review   Personal Goals Review Weight Management/Obesity;Hypertension;Lipids Weight Management/Obesity;Hypertension;Lipids Weight Management/Obesity;Hypertension;Lipids     Review Jim staClair Gullingd intensive cardiac rehab on 12/23/21. JIm did well with exercise Jim hasClair Gullingen doing well with intensive  cardiac rehab. Vital signs have been stable Jim conClair Gullingues to do  well with intensive cardiac rehab. Vital signs have been stable. Jim wilClair Gullingomplete cardiac rehab at the end of the week.     Expected Outcomes Jim wilClair Gullingontinue to participate in intensive cardiac rehab for exercise, nutrition  and lifestyle modifications Clair Gulling will continue to participate in intensive cardiac rehab for exercise, nutrition and lifestyle modifications Clair Gulling will continue to participate in intensive cardiac rehab for exercise, nutrition and lifestyle modifications              Core Components/Risk Factors/Patient Goals at Discharge (Final Review):   Goals and Risk Factor Review - 02/12/22 0952       Core Components/Risk Factors/Patient Goals Review   Personal Goals Review Weight Management/Obesity;Hypertension;Lipids    Review Clair Gulling continues to do  well with intensive cardiac rehab. Vital signs have been stable. Clair Gulling will complete cardiac rehab at the end of the week.    Expected Outcomes Clair Gulling will continue to participate in intensive cardiac rehab for exercise, nutrition and lifestyle modifications             ITP Comments:  ITP Comments     Row Name 12/19/21 1129 12/23/21 1720 01/22/22 0832 02/12/22 0950     ITP Comments Dr. Radford Pax Medical Director Introduction to  Pritikin education Program/Intensive Cardiac Rehab.  Initial Pritikin Orientation packet and video reviewed with pt 30 Day ITP Review. JIm started ICR on 12/23/21 and did well with exercise 30 Day ITP Review. JIm has good attendance and participation in intensive cardiac rehab 30 Day ITP Review. Clair Gulling continues to have  good attendance and participation in intensive cardiac rehab             Comments: See ITP comments.Harrell Gave RN BSN

## 2022-02-14 ENCOUNTER — Encounter (HOSPITAL_COMMUNITY)
Admission: RE | Admit: 2022-02-14 | Discharge: 2022-02-14 | Disposition: A | Discharge status: Home / Self Care | Payer: MEDICARE | Source: Ambulatory Visit | Attending: Cardiology | Admitting: Cardiology

## 2022-02-14 DIAGNOSIS — I214 Non-ST elevation (NSTEMI) myocardial infarction: Secondary | ICD-10-CM | POA: Diagnosis not present

## 2022-02-17 ENCOUNTER — Encounter (HOSPITAL_COMMUNITY)
Admission: RE | Admit: 2022-02-17 | Discharge: 2022-02-17 | Disposition: A | Discharge status: Home / Self Care | Payer: MEDICARE | Source: Ambulatory Visit | Attending: Cardiology | Admitting: Cardiology

## 2022-02-17 VITALS — Ht 68.0 in | Wt 176.8 lb

## 2022-02-17 DIAGNOSIS — I214 Non-ST elevation (NSTEMI) myocardial infarction: Secondary | ICD-10-CM

## 2022-02-19 ENCOUNTER — Encounter (HOSPITAL_COMMUNITY)
Admission: RE | Admit: 2022-02-19 | Discharge: 2022-02-19 | Disposition: A | Discharge status: Home / Self Care | Payer: MEDICARE | Source: Ambulatory Visit | Attending: Cardiology | Admitting: Cardiology

## 2022-02-19 DIAGNOSIS — I214 Non-ST elevation (NSTEMI) myocardial infarction: Secondary | ICD-10-CM

## 2022-02-19 DIAGNOSIS — F4323 Adjustment disorder with mixed anxiety and depressed mood: Secondary | ICD-10-CM | POA: Diagnosis not present

## 2022-02-21 ENCOUNTER — Encounter (HOSPITAL_COMMUNITY)
Admission: RE | Admit: 2022-02-21 | Discharge: 2022-02-21 | Disposition: A | Discharge status: Home / Self Care | Payer: MEDICARE | Source: Ambulatory Visit | Attending: Cardiology | Admitting: Cardiology

## 2022-02-21 DIAGNOSIS — I214 Non-ST elevation (NSTEMI) myocardial infarction: Secondary | ICD-10-CM | POA: Diagnosis not present

## 2022-02-21 NOTE — Progress Notes (Signed)
Discharge Progress Report  Patient Details  Name: Phillip Allen MRN: [id number] Date of Birth: 01/04/1941 Referring Provider:   Flowsheet Row INTENSIVE CARDIAC REHAB ORIENT from 12/19/2021 in [facility]  Referring Provider Dr Manish Patwardhan, MD        Number of Visits: 27  Reason for Discharge:  Patient reached a stable level of exercise. Patient independent in their exercise. Patient has met program and personal goals.  Smoking History:  Social History   Tobacco Use  Smoking Status Former   Packs/day: 0.25   Years: 10.00   Total pack years: 2.50   Types: Cigarettes   Quit date: 1996   Years since quitting: 27.8  Smokeless Tobacco Never  Tobacco Comments   on/off since age 18    Diagnosis:  NSTEMI (non-ST elevated myocardial infarction) (HCC)  ADL UCSD:   Initial Exercise Prescription:  Initial Exercise Prescription - 12/19/21 1300       Date of Initial Exercise RX and Referring Provider   Date 12/19/21    Referring Provider Dr Manish Patwardhan, MD    Expected Discharge Date 02/21/22      Recumbant Bike   Level 2    RPM 60    Minutes 15    METs 2.3      NuStep   Level 2    SPM 80    Minutes 15    METs 2.3      Prescription Details   Frequency (times per week) 3    Duration Progress to 30 minutes of continuous aerobic without signs/symptoms of physical distress      Intensity   THRR 40-80% of Max Heartrate 56-113    Ratings of Perceived Exertion 11-13    Perceived Dyspnea 0-4      Progression   Progression Continue progressive overload as per policy without signs/symptoms or physical distress.      Resistance Training   Training Prescription Yes    Weight 3    Reps 10-15             Discharge Exercise Prescription (Final Exercise Prescription Changes):  Exercise Prescription Changes - 02/21/22 1634       Response to Exercise   Blood Pressure (Admit) 112/70    Blood Pressure (Exercise) 158/82     Blood Pressure (Exit) 108/64    Heart Rate (Admit) 54 bpm    Heart Rate (Exercise) 116 bpm    Heart Rate (Exit) 64 bpm    Rating of Perceived Exertion (Exercise) 12    Perceived Dyspnea (Exercise) 0    Symptoms none    Comments Pt graduated teh CRP2 program    Duration Continue with 30 min of aerobic exercise without signs/symptoms of physical distress.    Intensity THRR unchanged      Progression   Progression Continue to progress workloads to maintain intensity without signs/symptoms of physical distress.    Average METs 4.09      Resistance Training   Training Prescription Yes    Weight 6    Reps 10-15    Time 10 Minutes      Interval Training   Interval Training No      Treadmill   MPH 3.3    Grade 1    Minutes 15    METs 3.98      NuStep   Level 5    Minutes 15    METs 4.2      Track   Laps 9     6MWT Post   Minutes 6      Home Exercise Plan   [facility]    Frequency Add 2 additional days to program exercise sessions.    Initial Home Exercises Provided 01/08/22             Functional Capacity:  6 Minute Walk     Row Name 12/19/21 1319 02/17/22 1652       6 Minute Walk   Phase Initial Discharge    Distance 1511 feet 1826 feet    Distance % Change -- 20.85 %    Distance Feet Change -- 314 ft    Walk Time 6 minutes 6 minutes    # of Rest Breaks 0 0    MPH 2.86 3.46    METS 2.56 3.54    RPE 9 12    Perceived Dyspnea  0 0    VO2 Peak 8.94 12.4    Symptoms No No    Resting HR 56 bpm 78 bpm    Resting BP 107/58 112/54    Resting Oxygen Saturation  97 % 96 %    Exercise Oxygen Saturation  during 6 min walk 98 % 98 %    Max Ex. HR 76 bpm 116 bpm    Max Ex. BP 126/58 132/64    2 Minute Post BP 122/62 --             Psychological, QOL, Others - Outcomes: PHQ 2/9:    02/21/2022    3:15 PM 12/19/2021   10:18 AM  Depression screen PHQ 2/9  Decreased Interest  1  Down, Depressed, Hopeless  1 1  PHQ - 2 Score 1 2  Altered sleeping 0 1  Tired, decreased energy 0 2  Change in appetite 0 1  Feeling bad or failure about yourself  0 0  Trouble concentrating 0 0  Moving slowly or fidgety/restless 0 0  Suicidal thoughts 0 0  PHQ-9 Score 1 6  Difficult doing work/chores Somewhat difficult Not difficult at all    Quality of Life:  Quality of Life - 02/21/22 1638       Quality of Life   Select Quality of Life      Quality of Life Scores   Health/Function Post 25 %    Socioeconomic Post 25.36 %    Psych/Spiritual Post 17.75 %    Family Post 27.6 %    GLOBAL Post 24.15 %             Personal Goals: Goals established at orientation with interventions provided to work toward goal.  Personal Goals and Risk Factors at Admission - 12/19/21 1048       Core Components/Risk Factors/Patient Goals on Admission    Weight Management Weight Maintenance;Yes    Intervention Weight Management: Develop a combined nutrition and exercise program designed to reach desired caloric intake, while maintaining appropriate intake of nutrient and fiber, sodium and fats, and appropriate energy expenditure required for the weight goal.;Weight Management: Provide education and appropriate resources to help participant work on and attain dietary goals.    Admit Weight 178 lb 5.6 oz (80.9 kg)    Expected Outcomes Long Term: Adherence to nutrition and physical activity/exercise program aimed toward attainment of established weight goal;Weight Maintenance: Understanding of the daily nutrition guidelines, which includes 25-35% calories from fat, 7% or less cal from saturated fats, less than 200mg cholesterol, less than 1.5gm of sodium, & 5 or more servings   of fruits and vegetables daily;Understanding recommendations for meals to include 15-35% energy as protein, 25-35% energy from fat, 35-60% energy from carbohydrates, less than 200mg of dietary cholesterol, 20-35 gm of total fiber daily    Hypertension  Yes    Intervention Provide education on lifestyle modifcations including regular physical activity/exercise, weight management, moderate sodium restriction and increased consumption of fresh fruit, vegetables, and low fat dairy, alcohol moderation, and smoking cessation.;Monitor prescription use compliance.    Expected Outcomes Short Term: Continued assessment and intervention until BP is < 140/90mm HG in hypertensive participants. < 130/80mm HG in hypertensive participants with diabetes, heart failure or chronic kidney disease.;Long Term: Maintenance of blood pressure at goal levels.    Lipids Yes    Intervention Provide education and support for participant on nutrition & aerobic/resistive exercise along with prescribed medications to achieve LDL <70mg, HDL >40mg.    Expected Outcomes Short Term: Participant states understanding of desired cholesterol values and is compliant with medications prescribed. Participant is following exercise prescription and nutrition guidelines.;Long Term: Cholesterol controlled with medications as prescribed, with individualized exercise RX and with personalized nutrition plan. Value goals: LDL < 70mg, HDL > 40 mg.    Personal Goal Vin desires to return playing pickball at the [employer] to benefit him physically as well as mental well being    Intervention Provide support and encouragement to increase physical activity.  Strongly encourage consistent home exercise and routine.    Expected Outcomes Avigdor will report to cardiac rehab staff he has return to playing pickleball with no complaints or issues              Personal Goals Discharge:  Goals and Risk Factor Review     Row Name 12/23/21 1723 01/22/22 0835 02/12/22 0952         Core Components/Risk Factors/Patient Goals Review   Personal Goals Review Weight Management/Obesity;Hypertension;Lipids Weight Management/Obesity;Hypertension;Lipids Weight Management/Obesity;Hypertension;Lipids     Review Jim started  intensive cardiac rehab on 12/23/21. JIm did well with exercise Jim has been doing well with intensive cardiac rehab. Vital signs have been stable Jim continues to do  well with intensive cardiac rehab. Vital signs have been stable. Jim will complete cardiac rehab at the end of the week.     Expected Outcomes Jim will continue to participate in intensive cardiac rehab for exercise, nutrition and lifestyle modifications Jim will continue to participate in intensive cardiac rehab for exercise, nutrition and lifestyle modifications Jim will continue to participate in intensive cardiac rehab for exercise, nutrition and lifestyle modifications              Exercise Goals and Review:  Exercise Goals     Row Name 12/19/21 1325             Exercise Goals   Increase Physical Activity Yes       Intervention Provide advice, education, support and counseling about physical activity/exercise needs.;Develop an individualized exercise prescription for aerobic and resistive training based on initial evaluation findings, risk stratification, comorbidities and participant's personal goals.       Expected Outcomes Short Term: Attend rehab on a regular basis to increase amount of physical activity.;Long Term: Add in home exercise to make exercise part of routine and to increase amount of physical activity.;Long Term: Exercising regularly at least 3-5 days a week.       Increase Strength and Stamina Yes       Intervention Provide advice, education, support and counseling about physical activity/exercise needs.;Develop an   individualized exercise prescription for aerobic and resistive training based on initial evaluation findings, risk stratification, comorbidities and participant's personal goals.       Expected Outcomes Short Term: Increase workloads from initial exercise prescription for resistance, speed, and METs.;Short Term: Perform resistance training exercises routinely during rehab and add in resistance  training at home;Long Term: Improve cardiorespiratory fitness, muscular endurance and strength as measured by increased METs and functional capacity (6MWT)       Able to understand and use rate of perceived exertion (RPE) scale Yes       Intervention Provide education and explanation on how to use RPE scale       Expected Outcomes Short Term: Able to use RPE daily in rehab to express subjective intensity level;Long Term:  Able to use RPE to guide intensity level when exercising independently       Knowledge and understanding of Target Heart Rate Range (THRR) Yes       Intervention Provide education and explanation of THRR including how the numbers were predicted and where they are located for reference       Expected Outcomes Short Term: Able to state/look up THRR;Long Term: Able to use THRR to govern intensity when exercising independently;Short Term: Able to use daily as guideline for intensity in rehab                Exercise Goals Re-Evaluation:  Exercise Goals Re-Evaluation     [place] Name 12/23/21 1559 01/08/22 1626 02/05/22 1612 02/21/22 1637       Exercise Goal Re-Evaluation   Exercise Goals Review Increase Physical Activity;Able to understand and use rate of perceived exertion (RPE) scale Increase Physical Activity;Able to understand and use rate of perceived exertion (RPE) scale;Knowledge and understanding of Target Heart Rate Range (THRR);Understanding of Exercise Prescription;Able to check pulse independently;Increase Strength and Stamina Increase Physical Activity;Able to understand and use rate of perceived exertion (RPE) scale;Knowledge and understanding of Target Heart Rate Range (THRR);Understanding of Exercise Prescription;Able to check pulse independently;Increase Strength and Stamina Increase Physical Activity;Able to understand and use rate of perceived exertion (RPE) scale;Knowledge and understanding of Target Heart Rate Range (THRR);Understanding of Exercise Prescription;Able  to check pulse independently;Increase Strength and Stamina    Comments Patient able to understand and use RPE scale appropriately. Reviewed MET's, goals and home ExRx. Pt tolerated exercise well with an average MET level of 3.75. Pt is currently meeting ACSM guidlines 5-7 days of 30 mins or more of exercise. Pt is playing pickleball and walking on the treadmill. Pt feels good about his goals of getting back to pickleball and gaining flexibility and stamina. Reviewed MET's, and goals. Pt tolerated exercise well with an average MET level of 3.9. Pt feels good about his progress so far and is gaining stamina. Pt is very excited becasue he is back to playing pickleball and planed two games last week and he is continuing to work on his flexibilty Pt graduated the [facility] program. Pt tolerated exercise well with an average MET level of 4.09. Pt will continue to exercise on his own by playing pickleball, walking on the TM,  and going to the [employer] [facility]-6 days for 30-45 mins per session. Pt feels good about his progress so far and has enjoyed his CRP2 experience    Expected Outcomes Progress workloads as tolerated to improve cardiorespiratory fitness and stamina. Pt will exercise on his own and gain strength. Will continue to monitor pt and progress workloads as tolerated without sign or symptom Will  continue to monitor pt and progress workloads as tolerated without sign or symptom Pt will contine to exercise on his own and gain strength.             Nutrition & Weight - Outcomes:  Pre Biometrics - 12/19/21 1319       Pre Biometrics   Waist Circumference 39.75 inches    Hip Circumference 39 inches    Waist to Hip Ratio 1.02 %    Triceps Skinfold 8 mm    % Body Fat 25.1 %    Grip Strength 38 kg    Flexibility --   Pt unable to reach   Single Leg Stand 30 seconds             Post Biometrics - 02/17/22 1651        Post  Biometrics   Height 5' 8" (1.727 m)    Weight 80.2 kg    Waist Circumference 40.5  inches    Hip Circumference 38.5 inches    Waist to Hip Ratio 1.05 %    BMI (Calculated) 26.89    Triceps Skinfold 10 mm    % Body Fat 26.2 %    Grip Strength 49 kg    Single Leg Stand 30 seconds             Nutrition:  Nutrition Therapy & Goals - 02/21/22 1523       Nutrition Therapy   Diet Heart Healthy Diet    Drug/Food Interactions Statins/Certain Fruits      Personal Nutrition Goals   Nutrition Goal Patient to choose a daily variety of fruit, vegetables, whole grains, lean protein/plant protein, nonfat dairy as part of heart healthy lifestyle    Personal Goal #2 Patient to limit sodium to <1500mg daily    Personal Goal #3 Patient to identify food sources of trans fats, saturated fat, sodium, and refined carbohydrates    Comments Goals in action. Jim graduates today. Jim has attended the [employer] education series. He continues to work toward reduction in sodium, reduction of processed meats/saturated fat, and increasing high fiber foods including fruits/vegetables and whole grains. He continues to see behavioral health counseling r/t the recent death of his brother. He has maintained his weight since starting with our program.      Intervention Plan   Intervention Prescribe, educate and counsel regarding individualized specific dietary modifications aiming towards targeted core components such as weight, hypertension, lipid management, diabetes, heart failure and other comorbidities.;Nutrition handout(s) given to patient.    Expected Outcomes Short Term Goal: Understand basic principles of dietary content, such as calories, fat, sodium, cholesterol and nutrients.;Long Term Goal: Adherence to prescribed nutrition plan.             Nutrition Discharge:  Nutrition Assessments - 02/24/22 0959       Rate Your Plate Scores   Post Score 86             Education Questionnaire Score:  Knowledge Questionnaire Score - 02/21/22 1638       Knowledge Questionnaire Score    Post Score 24/24             Goals reviewed with patient; copy given to patient.Pt graduates from  Intensive cardiac rehab program on 02/21/22  with completion of  27 exercise and education sessions. Pt maintained good attendance and progressed nicely during their participation in rehab as evidenced by increased MET level.   Medication list reconciled. Repeat  PHQ score- 1 .    Pt has made significant lifestyle changes and should be commended for their success.  Jim  achieved their goals during cardiac rehab. Jim says participating in cardiac rehab has been helpful for him. Jim says that participating in intensive cardiac rehab was helpful for him.   Pt plans to continue exercise at the [employer] and playing pickelball. Jim increased his distance on his post exercise walk test by 314 feet.  We are proud of Jim's progress!Maria Walden Whitaker RN BSN  

## 2022-02-26 DIAGNOSIS — R0989 Other specified symptoms and signs involving the circulatory and respiratory systems: Secondary | ICD-10-CM | POA: Diagnosis not present

## 2022-02-26 DIAGNOSIS — R768 Other specified abnormal immunological findings in serum: Secondary | ICD-10-CM | POA: Diagnosis not present

## 2022-02-26 DIAGNOSIS — H04129 Dry eye syndrome of unspecified lacrimal gland: Secondary | ICD-10-CM | POA: Diagnosis not present

## 2022-02-26 DIAGNOSIS — M1230 Palindromic rheumatism, unspecified site: Secondary | ICD-10-CM | POA: Diagnosis not present

## 2022-03-05 DIAGNOSIS — F4323 Adjustment disorder with mixed anxiety and depressed mood: Secondary | ICD-10-CM | POA: Diagnosis not present

## 2022-03-19 DIAGNOSIS — F4323 Adjustment disorder with mixed anxiety and depressed mood: Secondary | ICD-10-CM | POA: Diagnosis not present

## 2022-04-02 DIAGNOSIS — F4323 Adjustment disorder with mixed anxiety and depressed mood: Secondary | ICD-10-CM | POA: Diagnosis not present

## 2022-04-10 ENCOUNTER — Encounter: Payer: Self-pay | Admitting: Pulmonary Disease

## 2022-04-10 ENCOUNTER — Ambulatory Visit (INDEPENDENT_AMBULATORY_CARE_PROVIDER_SITE_OTHER): Payer: MEDICARE | Admitting: Pulmonary Disease

## 2022-04-10 VITALS — BP 126/66 | HR 56 | Temp 97.5°F | Ht 69.0 in | Wt 180.4 lb

## 2022-04-10 DIAGNOSIS — I2510 Atherosclerotic heart disease of native coronary artery without angina pectoris: Secondary | ICD-10-CM

## 2022-04-10 DIAGNOSIS — R9389 Abnormal findings on diagnostic imaging of other specified body structures: Secondary | ICD-10-CM

## 2022-04-10 NOTE — Progress Notes (Addendum)
Phillip Allen    [id number]    04-02-1941  Primary Care Physician:Dewey, Mechele Claude, MD  Referring Physician: Carol Ada, [address] [place],  [place] [address]  Chief complaint: Consult for evaluation of lung crackles  HPI: 81 y.o. who  has a past medical history of Depression and Hyperlipidemia.   He was evaluated at [facility] rheumatology by Dr. Carlene Coria and for migratory arthritis, positive rheumatoid factor.  He was given a course of meloxicam with resolution of symptoms.  He does not have a diagnosis of rheumatoid arthritis Crackles in the right lower lobe were noted on examination and he has been referred here for further evaluation  States that his breathing is stable with no issues.  Denies any dyspnea, cough, congestion.  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  No mold, hot tub, Jacuzzi.  No feather pillows or comforters Smoking history: 20-pack-year smoker.  Quit in 1996 Travel history: Lived in [place] for many years.  No significant recent travel Relevant family history: No family history of lung disease   Outpatient Encounter Medications as of 04/10/2022  Medication Sig   aspirin EC 81 MG tablet Take 1 tablet (81 mg total) by mouth daily.   Collagen-Boron-Hyaluronic Acid (MOVE FREE ULTRA JOINT HEALTH PO) Take 1 tablet by mouth daily.   diclofenac Sodium (VOLTAREN) 1 % GEL Apply 1 application  topically 4 (four) times daily as needed (pain).   docusate sodium (COLACE) 100 MG capsule Take 100 mg by mouth 2 (two) times daily.   FARXIGA 5 MG TABS tablet Take 5 mg by mouth every morning.   Krill Oil 500 MG CAPS Take 500 mg by mouth daily.   losartan (COZAAR) 25 MG tablet Take 12.5 mg by mouth daily.   meloxicam (MOBIC) 7.5 MG tablet    metoprolol succinate (TOPROL XL) 25 MG 24 hr tablet Take 1 tablet (25 mg total) by mouth daily.    Multiple Vitamins-Minerals (MULTIVITAMIN MEN 50+ PO) Take 1 tablet by mouth daily.   Polyvinyl Alcohol-Povidone (REFRESH OP) Place 1 drop into both eyes daily as needed (dry eyes).   rosuvastatin (CRESTOR) 40 MG tablet Take 40 mg by mouth daily.   vortioxetine HBr (TRINTELLIX) 20 MG TABS tablet Take 20 mg by mouth daily.   nitroGLYCERIN (NITROSTAT) 0.4 MG SL tablet Place 1 tablet (0.4 mg total) under the tongue every 5 (five) minutes as needed for chest pain. (Patient not taking: Reported on 04/10/2022)   No facility-administered encounter medications on file as of 04/10/2022.    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/2021   Procedure: CORONARY STENT INTERVENTION;  Surgeon: [name], MD;  Location: [place] [facility];  Service: Cardiovascular;  Laterality: N/A;   LEFT HEART CATH AND CORONARY ANGIOGRAPHY N/A 10/16/2021   Procedure: LEFT HEART CATH AND CORONARY ANGIOGRAPHY;  Surgeon: [name], MD;  Location: [place] [facility];  Service: Cardiovascular;  Laterality: N/A;   PACEMAKER IMPLANT N/A 10/17/2021   Procedure: PACEMAKER IMPLANT;  Surgeon: Evans Lance, MD;  Location: [facility];  Service: Cardiovascular;  Laterality: N/A;  SHOULDER SURGERY Right 06/2017   TEMPORARY PACEMAKER N/A 10/16/2021   Procedure: TEMPORARY PACEMAKER;  Surgeon: [name], MD;  Location: [place] [facility];  Service: Cardiovascular;  Laterality: N/A;    Family History  Problem Relation Age of Onset   Breast cancer Mother    Stomach cancer Mother    Lung cancer Father    Heart attack Brother     Social History   Socioeconomic History   Marital status: Widowed    Spouse name: Not on file   Number of children: 2   Years  of education: Not on file   Highest education level: Not on file  Occupational History   Not on file  Tobacco Use   Smoking status: Former    Packs/day: 0.25    Years: 10.00    Total pack years: 2.50    Types: Cigarettes    Quit date: 27    Years since quitting: 27.9   Smokeless tobacco: Never   Tobacco comments:    on/off since age 8  Vaping Use   Vaping Use: Never used  Substance and Sexual Activity   Alcohol use: Not Currently    Comment: has not drank any alcohol since Oct 2021   Drug use: Never   Sexual activity: Not on file  Other Topics Concern   Not on file  Social History Narrative   Not on file   Social Determinants of Health   Financial Resource Strain: Not on file  Food Insecurity: Not on file  Transportation Needs: Not on file  Physical Activity: Not on file  Stress: Not on file  Social Connections: Not on file  Intimate Partner Violence: Not on file    Review of systems: Review of Systems  Constitutional: Negative for fever and chills.  HENT: Negative.   Eyes: Negative for blurred vision.  Respiratory: as per HPI  Cardiovascular: Negative for chest pain and palpitations.  Gastrointestinal: Negative for vomiting, diarrhea, blood per rectum. Genitourinary: Negative for dysuria, urgency, frequency and hematuria.  Musculoskeletal: Negative for myalgias, back pain and joint pain.  Skin: Negative for itching and rash.  Neurological: Negative for dizziness, tremors, focal weakness, seizures and loss of consciousness.  Endo/Heme/Allergies: Negative for environmental allergies.  Psychiatric/Behavioral: Negative for depression, suicidal ideas and hallucinations.  All other systems reviewed and are negative.  Physical Exam: Blood pressure 126/66, pulse (!) 56, temperature (!) 97.5 F (36.4 C), temperature source Oral, height '5\' 9"'$  (1.753 m), weight 180 lb 6.4 oz (81.8 kg), SpO2 97 %. Gen:      No acute distress HEENT:  EOMI, sclera anicteric Neck:      No masses; no thyromegaly Lungs:    Clear to auscultation bilaterally; normal respiratory effort CV:         Regular rate and rhythm; no murmurs Abd:      + bowel sounds; soft, non-tender; no palpable masses, no distension Ext:    No edema; adequate peripheral perfusion Skin:      Warm and dry; no rash Neuro: alert and oriented x 3 Psych: normal mood and affect  Data Reviewed: Imaging: Chest x-ray 10/16/2021- elevated right hemidiaphragm with basilar atelectasis, left lung was clear. 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  I suspect this is chronic.  Etiology of hemidiaphragm elevation is unclear as he has no neck or chest surgeries in the past  He has fine crackles at the right base noted on examination which I suspect is secondary to atelectasis due to hemidiaphragm elevation.  Will get high-resolution CT and PFTs for better evaluation of the lung and to evaluate for restriction.  He has been evaluated at rheumatology for inflammatory arthritis, positive rheumatoid factor.  Will obtain records for review  Plan/Recommendations: High-res CT, PFTs. Rheumatology records.  Marshell Garfinkel MD [facility] 04/10/2022, 1:10 PM  CC: Carol Ada, MD

## 2022-04-10 NOTE — Patient Instructions (Addendum)
High-resolution CT and pulmonary function test for better evaluation of the lungs Return to clinic in 2 months after completion of these tests.

## 2022-04-16 DIAGNOSIS — F4323 Adjustment disorder with mixed anxiety and depressed mood: Secondary | ICD-10-CM | POA: Diagnosis not present

## 2022-04-21 ENCOUNTER — Ambulatory Visit (INDEPENDENT_AMBULATORY_CARE_PROVIDER_SITE_OTHER): Payer: MEDICARE

## 2022-04-21 DIAGNOSIS — I441 Atrioventricular block, second degree: Secondary | ICD-10-CM

## 2022-04-22 LAB — CUP PACEART REMOTE DEVICE CHECK
Battery Remaining Longevity: 141 mo
Battery Voltage: 3.14 V
Brady Statistic AP VP Percent: 4.37 %
Brady Statistic AP VS Percent: 0 %
Brady Statistic AS VP Percent: 95.58 %
Brady Statistic AS VS Percent: 0.05 %
Brady Statistic RA Percent Paced: 4.36 %
Brady Statistic RV Percent Paced: 99.95 %
Date Time Interrogation Session: 20231218021828
Implantable Lead Connection Status: 753985
Implantable Lead Connection Status: 753985
Implantable Lead Implant Date: 20230615
Implantable Lead Implant Date: 20230615
Implantable Lead Location: 753859
Implantable Lead Location: 753860
Implantable Lead Model: 3830
Implantable Lead Model: 5076
Implantable Pulse Generator Implant Date: 20230615
Lead Channel Impedance Value: 342 Ohm
Lead Channel Impedance Value: 361 Ohm
Lead Channel Impedance Value: 513 Ohm
Lead Channel Impedance Value: 589 Ohm
Lead Channel Pacing Threshold Amplitude: 0.625 V
Lead Channel Pacing Threshold Amplitude: 1 V
Lead Channel Pacing Threshold Pulse Width: 0.4 ms
Lead Channel Pacing Threshold Pulse Width: 0.4 ms
Lead Channel Sensing Intrinsic Amplitude: 12.75 mV
Lead Channel Sensing Intrinsic Amplitude: 2.625 mV
Lead Channel Sensing Intrinsic Amplitude: 2.625 mV
Lead Channel Setting Pacing Amplitude: 2 V
Lead Channel Setting Pacing Amplitude: 2.5 V
Lead Channel Setting Pacing Pulse Width: 0.4 ms
Lead Channel Setting Sensing Sensitivity: 2 mV
Zone Setting Status: 755011
Zone Setting Status: 755011

## 2022-05-02 ENCOUNTER — Ambulatory Visit: Payer: MEDICARE | Admitting: Cardiology

## 2022-05-09 ENCOUNTER — Encounter: Payer: Self-pay | Admitting: Cardiology

## 2022-05-09 ENCOUNTER — Ambulatory Visit: Payer: MEDICARE | Admitting: Cardiology

## 2022-05-09 VITALS — BP 128/68 | HR 55 | Resp 16 | Ht 69.0 in | Wt 181.0 lb

## 2022-05-09 DIAGNOSIS — I2510 Atherosclerotic heart disease of native coronary artery without angina pectoris: Secondary | ICD-10-CM

## 2022-05-09 DIAGNOSIS — E782 Mixed hyperlipidemia: Secondary | ICD-10-CM | POA: Diagnosis not present

## 2022-05-09 DIAGNOSIS — I10 Essential (primary) hypertension: Secondary | ICD-10-CM | POA: Diagnosis not present

## 2022-05-09 NOTE — Progress Notes (Signed)
Follow up visit  Subjective:   Phillip Allen, male    DOB: 05/30/1940, 82 y.o.   MRN: [id number]   HPI  81 y.o. 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  Blood pressure elevated today, but usually better controlled.   Current Outpatient Medications:    aspirin EC 81 MG tablet, Take 1 tablet (81 mg total) by mouth daily., Disp: 30 tablet, Rfl: 3   Collagen-Boron-Hyaluronic Acid (MOVE FREE ULTRA JOINT HEALTH PO), Take 1 tablet by mouth daily., Disp: , Rfl:    diclofenac Sodium (VOLTAREN) 1 % GEL, Apply 1 application  topically 4 (four) times daily as needed (pain)., Disp: , Rfl:    docusate sodium (COLACE) 100 MG capsule, Take 100 mg by mouth 2 (two) times daily., Disp: , Rfl:    FARXIGA 5 MG TABS tablet, Take 5 mg by mouth every morning., Disp: , Rfl:    Krill Oil 500 MG CAPS, Take 500 mg by mouth daily., Disp: , Rfl:    losartan (COZAAR) 25 MG tablet, Take 12.5 mg by mouth daily., Disp: , Rfl:    meloxicam (MOBIC) 7.5 MG tablet, , Disp: , Rfl:    metoprolol succinate (TOPROL XL) 25 MG 24 hr tablet, Take 1 tablet (25 mg total) by mouth daily., Disp: 30 tablet, Rfl: 0   Multiple Vitamins-Minerals (MULTIVITAMIN MEN 50+ PO), Take 1 tablet by mouth daily., Disp: , Rfl:    nitroGLYCERIN (NITROSTAT) 0.4 MG SL tablet, Place 1 tablet (0.4 mg total) under the tongue every 5 (five) minutes as needed for chest pain. (Patient not taking: Reported on 04/10/2022), Disp: 30 tablet, Rfl: 3   Polyvinyl Alcohol-Povidone (REFRESH OP), Place 1 drop into both eyes daily as needed (dry eyes)., Disp: , Rfl:    rosuvastatin (CRESTOR) 40 MG tablet, Take 40 mg by mouth daily., Disp: , Rfl:    vortioxetine HBr (TRINTELLIX) 20 MG TABS tablet, Take 20 mg by mouth daily., Disp: , Rfl:   Cardiovascular & other pertient studies:  EKG 05/09/2022: Ventricular paced  rhythm  Pacemaker implantation 10/17/2021 (Dr. Lovena Le): CONCLUSIONS:   1. 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 No early apparent complications.   Echocardiogram 10/16/2021:   1.Left ventricular ejection fraction, by estimation, is 60 to 65%. The  left ventricle has normal function. The left ventricle has no regional  wall motion abnormalities. Left ventricular diastolic function could not  be evaluated.   2. Right ventricular systolic function is normal. The right ventricular  size is normal.   3. Left atrial size was mildly dilated.   4. Right atrial size was mildly dilated.   5. The mitral valve is normal in structure. Mild mitral valve  regurgitation. No evidence of mitral stenosis.   6. Tricuspid valve regurgitation is moderate.   7. Inadequate Doppler assessment of aortic valve, unlikely to have  signifciant stenosis.. The aortic valve is tricuspid. There is mild  calcification of the aortic valve. Aortic valve regurgitation is mild.  Aortic valve sclerosis is present, with no  evidence of aortic valve stenosis.   Comparison(s): No significant change compared to previous study on  08/01/2020.   Coronary angiography 10/16/2021: LM Normal LAD: Prox 40% disease LCx: Ostial 50% stenosis        PM1 prox occlusion, possibly  subacute      RCA: RPLB flush occlusion (chronic)   Attempted but unsuccessful percutaneous coronary intervention OM1   Temporary pacemaker placement Plan to perform PPM on 6/15.  Recent labs: 10/17/2021: Glucose 97, BUN/Cr 14/0.64. EGFR >60. Na/K 139/3.8. Rest of the CMP normal BNP 249 H/H 13/39. MCV 94. Platelets 221 TSH 3.2 normal Lipoprotein (a) 99  04/17/2020: Glucose 121, BUN/Cr 15/0.8. EGFR 86. HbA1C 6.3% Chol 133, TG 102, HDL 44, LDL 70 TSH 2.7 normal  Review of Systems  Cardiovascular:  Negative for chest pain, dyspnea on exertion, leg  swelling, palpitations and syncope.         Vitals:   05/09/22 1022 05/09/22 1029  BP: (!) 167/67 128/68  Pulse: (!) 59 (!) 55  Resp: 16   SpO2: 96%       Objective:   Physical Exam Vitals and nursing note reviewed.  Constitutional:      General: He is not in acute distress. Neck:     Vascular: No JVD.  Cardiovascular:     Rate and Rhythm: Normal rate and regular rhythm.     Heart sounds: Normal heart sounds. No murmur heard. Pulmonary:     Effort: Pulmonary effort is normal.     Breath sounds: Normal breath sounds. No wheezing or rales.  Chest:     Comments: Pacemaker scar well healed Musculoskeletal:     Right lower leg: No edema.     Left lower leg: No edema.       Assessment & Recommendations:   82 y.o. 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 Recommend medical management. Continue Aspirin, statin, metoprolol, amlodipine. Check lipid panel. Lipoprotein a is only minimally elevated.  High grade AV block: Now s/p PPM placement  Hypertension: Suspect provide hypertension.  Recommend home monitoring.  Mixed hyperlipidemia: Continue Crestor 40 mg daily. Check lipid panel  F/u in 6 months   [name], MD Pager: [phone] Office: [phone]

## 2022-05-15 ENCOUNTER — Ambulatory Visit
Admission: RE | Admit: 2022-05-15 | Discharge: 2022-05-15 | Disposition: A | Discharge status: Home / Self Care | Payer: MEDICARE | Source: Ambulatory Visit | Attending: Pulmonary Disease | Admitting: Pulmonary Disease

## 2022-05-15 DIAGNOSIS — I700 Atherosclerosis of aorta: Secondary | ICD-10-CM | POA: Diagnosis not present

## 2022-05-15 DIAGNOSIS — I2510 Atherosclerotic heart disease of native coronary artery without angina pectoris: Secondary | ICD-10-CM | POA: Diagnosis not present

## 2022-05-15 DIAGNOSIS — J9811 Atelectasis: Secondary | ICD-10-CM | POA: Diagnosis not present

## 2022-05-15 DIAGNOSIS — R9389 Abnormal findings on diagnostic imaging of other specified body structures: Secondary | ICD-10-CM

## 2022-05-15 DIAGNOSIS — R918 Other nonspecific abnormal finding of lung field: Secondary | ICD-10-CM | POA: Diagnosis not present

## 2022-05-19 DIAGNOSIS — F4323 Adjustment disorder with mixed anxiety and depressed mood: Secondary | ICD-10-CM | POA: Diagnosis not present

## 2022-05-26 NOTE — Progress Notes (Signed)
Remote pacemaker transmission.   

## 2022-05-28 DIAGNOSIS — F4323 Adjustment disorder with mixed anxiety and depressed mood: Secondary | ICD-10-CM | POA: Diagnosis not present

## 2022-06-11 ENCOUNTER — Ambulatory Visit (INDEPENDENT_AMBULATORY_CARE_PROVIDER_SITE_OTHER): Payer: MEDICARE | Admitting: Pulmonary Disease

## 2022-06-11 DIAGNOSIS — R9389 Abnormal findings on diagnostic imaging of other specified body structures: Secondary | ICD-10-CM | POA: Diagnosis not present

## 2022-06-11 DIAGNOSIS — F4323 Adjustment disorder with mixed anxiety and depressed mood: Secondary | ICD-10-CM | POA: Diagnosis not present

## 2022-06-11 LAB — PULMONARY FUNCTION TEST
DL/VA % pred: 134 %
DL/VA: 5.28 ml/min/mmHg/L
DLCO cor % pred: 76 %
DLCO cor: 18.23 ml/min/mmHg
DLCO unc % pred: 76 %
DLCO unc: 18.23 ml/min/mmHg
FEF 25-75 Post: 3.2 L/sec
FEF 25-75 Pre: 2.57 L/sec
FEF2575-%Change-Post: 24 %
FEF2575-%Pred-Post: 165 %
FEF2575-%Pred-Pre: 133 %
FEV1-%Change-Post: 3 %
FEV1-%Pred-Post: 61 %
FEV1-%Pred-Pre: 59 %
FEV1-Post: 1.71 L
FEV1-Pre: 1.66 L
FEV1FVC-%Change-Post: 2 %
FEV1FVC-%Pred-Pre: 127 %
FEV6-%Change-Post: 0 %
FEV6-%Pred-Post: 49 %
FEV6-%Pred-Pre: 49 %
FEV6-Post: 1.82 L
FEV6-Pre: 1.81 L
FEV6FVC-%Pred-Post: 107 %
FEV6FVC-%Pred-Pre: 107 %
FVC-%Change-Post: 0 %
FVC-%Pred-Post: 46 %
FVC-%Pred-Pre: 46 %
FVC-Post: 1.82 L
FVC-Pre: 1.81 L
Post FEV1/FVC ratio: 94 %
Post FEV6/FVC ratio: 100 %
Pre FEV1/FVC ratio: 91 %
Pre FEV6/FVC Ratio: 100 %
RV % pred: 71 %
RV: 1.85 L
TLC % pred: 55 %
TLC: 3.84 L

## 2022-06-11 NOTE — Patient Instructions (Signed)
Full PFT performed today. °

## 2022-06-11 NOTE — Progress Notes (Signed)
Full PFT performed today. °

## 2022-06-25 ENCOUNTER — Ambulatory Visit (INDEPENDENT_AMBULATORY_CARE_PROVIDER_SITE_OTHER): Payer: MEDICARE | Admitting: Pulmonary Disease

## 2022-06-25 ENCOUNTER — Encounter: Payer: Self-pay | Admitting: Pulmonary Disease

## 2022-06-25 VITALS — BP 120/58 | HR 60 | Temp 97.8°F | Ht 70.0 in | Wt 181.0 lb

## 2022-06-25 DIAGNOSIS — F4323 Adjustment disorder with mixed anxiety and depressed mood: Secondary | ICD-10-CM | POA: Diagnosis not present

## 2022-06-25 DIAGNOSIS — I2510 Atherosclerotic heart disease of native coronary artery without angina pectoris: Secondary | ICD-10-CM

## 2022-06-25 DIAGNOSIS — R9389 Abnormal findings on diagnostic imaging of other specified body structures: Secondary | ICD-10-CM

## 2022-06-25 DIAGNOSIS — R911 Solitary pulmonary nodule: Secondary | ICD-10-CM

## 2022-06-25 NOTE — Patient Instructions (Addendum)
I am glad you are doing well with your breathing Your CT scan does show elevation of the diaphragm on the right which is causing some lung compression.  There is also some atelectasis which just means that the lung is not fully expanded there.  These are benign conditions and would likely not get any worse Continue regular exercise Follow-up as needed please just follow-up post

## 2022-06-25 NOTE — Progress Notes (Signed)
Phillip Allen    MU:[id number]    [birth year]  Primary Care Physician:Dewey, Mechele Claude, MD  Referring Physician: Fanny Bien, MD [address],  [place] [id number]  Chief complaint: Follow-up for evaluation of lung crackles  HPI: 82 y.o. who  has a past medical history of Depression and Hyperlipidemia.   He was evaluated at [facility] rheumatology by Dr. Carlene Coria and for migratory arthritis, positive rheumatoid factor.  He was given a course of meloxicam with resolution of symptoms.  He does not have a diagnosis of rheumatoid arthritis Crackles in the right lower lobe were noted on examination and he has been referred here for further evaluation  States that his breathing is stable with no issues.  Denies any dyspnea, cough, congestion.  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  No mold, hot tub, Jacuzzi.  No feather pillows or comforters Smoking history: 20-pack-year smoker.  Quit in 1996 Travel history: Lived in [place] for many years.  No significant recent travel Relevant family history: No family history of lung disease  Interim history: Here for review of CT and PFTs.  States that breathing is stable with no issues.  Outpatient Encounter Medications as of 06/25/2022  Medication Sig   aspirin EC 81 MG tablet Take 1 tablet (81 mg total) by mouth daily.   Collagen-Boron-Hyaluronic Acid (MOVE FREE ULTRA JOINT HEALTH PO) Take 1 tablet by mouth daily.   diclofenac Sodium (VOLTAREN) 1 % GEL Apply 1 application  topically 4 (four) times daily as needed (pain).   docusate sodium (COLACE) 100 MG capsule Take 100 mg by mouth 2 (two) times daily.   FARXIGA 5 MG TABS tablet Take 5 mg by mouth every morning.   Krill Oil 500 MG CAPS Take 500 mg by mouth daily.   losartan (COZAAR) 25 MG tablet Take 12.5 mg by mouth daily.   meloxicam (MOBIC) 7.5 MG tablet    metoprolol  succinate (TOPROL XL) 25 MG 24 hr tablet Take 1 tablet (25 mg total) by mouth daily.   Multiple Vitamins-Minerals (MULTIVITAMIN MEN 50+ PO) Take 1 tablet by mouth daily.   Polyvinyl Alcohol-Povidone (REFRESH OP) Place 1 drop into both eyes daily as needed (dry eyes).   rosuvastatin (CRESTOR) 40 MG tablet Take 40 mg by mouth daily.   vortioxetine HBr (TRINTELLIX) 20 MG TABS tablet Take 20 mg by mouth daily.   nitroGLYCERIN (NITROSTAT) 0.4 MG SL tablet Place 1 tablet (0.4 mg total) under the tongue every 5 (five) minutes as needed for chest pain.   No facility-administered encounter medications on file as of 06/25/2022.    Physical Exam: Blood pressure (!) 120/58, pulse 60, temperature 97.8 F (36.6 C), temperature source Oral, height 5' 10"$  (1.778 m), weight 181 lb (82.1 kg), SpO2 95 %. Gen:      No acute distress HEENT:  EOMI, sclera anicteric Neck:     No masses; no thyromegaly Lungs:    Clear to auscultation bilaterally; normal respiratory effort CV:         Regular rate and rhythm; no murmurs Abd:      + bowel sounds; soft, non-tender; no palpable masses, no distension Ext:    No edema; adequate peripheral perfusion Skin:      Warm and dry; no rash Neuro: alert and oriented x 3 Psych: normal mood and affect   Data Reviewed: Imaging: Chest x-ray 10/16/2021-  elevated right hemidiaphragm with basilar atelectasis, left lung was clear.  High resolution CT 05/15/2022-elevated right hemidiaphragm with atelectasis.  No interstitial lung disease.  6 mm lung nodule in the right upper lobe. I have reviewed the images personally  PFTs: 06/11/2022 FVC 1.82 [46%], FEV1 1.71 [61%], F/F 94, TLC 3.84 [55%], DLCO 18.23 [76%] Moderate restriction, minimal diffusion defect  Labs:  Assessment:  Evaluation for interstitial lung disease Imaging is significant for elevated hemidiaphragm noted on chest x-ray.  I suspect this is chronic.  Etiology of hemidiaphragm elevation is unclear as he has no neck  or chest surgeries in the past  He has fine crackles at the right base noted on examination which I suspect is secondary to atelectasis due to hemidiaphragm elevation.  No evidence of interstitial lung disease PFTs reviewed with restriction consistent with hemidiaphragm elevation.  He has been evaluated at rheumatology for inflammatory arthritis, positive rheumatoid factor but not felt to have any autoimmune process.  No need for therapy at present.  Advised exercise and weight loss  Lung nodule Repeat CT in 1 year  Plan/Recommendations: Follow-up CT in 1 year  Marshell Garfinkel MD [facility] 06/25/2022, 11:34 AM  CC: Fanny Bien, MD

## 2022-07-09 DIAGNOSIS — F4323 Adjustment disorder with mixed anxiety and depressed mood: Secondary | ICD-10-CM | POA: Diagnosis not present

## 2022-07-16 DIAGNOSIS — E1165 Type 2 diabetes mellitus with hyperglycemia: Secondary | ICD-10-CM | POA: Diagnosis not present

## 2022-07-16 DIAGNOSIS — I10 Essential (primary) hypertension: Secondary | ICD-10-CM | POA: Diagnosis not present

## 2022-07-16 DIAGNOSIS — E782 Mixed hyperlipidemia: Secondary | ICD-10-CM | POA: Diagnosis not present

## 2022-07-16 DIAGNOSIS — E039 Hypothyroidism, unspecified: Secondary | ICD-10-CM | POA: Diagnosis not present

## 2022-07-21 ENCOUNTER — Ambulatory Visit (INDEPENDENT_AMBULATORY_CARE_PROVIDER_SITE_OTHER): Payer: MEDICARE

## 2022-07-21 DIAGNOSIS — I441 Atrioventricular block, second degree: Secondary | ICD-10-CM

## 2022-07-21 LAB — CUP PACEART REMOTE DEVICE CHECK
Battery Remaining Longevity: 139 mo
Battery Voltage: 3.08 V
Brady Statistic AP VP Percent: 2.97 %
Brady Statistic AP VS Percent: 0 %
Brady Statistic AS VP Percent: 97.01 %
Brady Statistic AS VS Percent: 0.02 %
Brady Statistic RA Percent Paced: 2.96 %
Brady Statistic RV Percent Paced: 99.98 %
Date Time Interrogation Session: 20240318021849
Implantable Lead Connection Status: 753985
Implantable Lead Connection Status: 753985
Implantable Lead Implant Date: 20230615
Implantable Lead Implant Date: 20230615
Implantable Lead Location: 753859
Implantable Lead Location: 753860
Implantable Lead Model: 3830
Implantable Lead Model: 5076
Implantable Pulse Generator Implant Date: 20230615
Lead Channel Impedance Value: 361 Ohm
Lead Channel Impedance Value: 380 Ohm
Lead Channel Impedance Value: 532 Ohm
Lead Channel Impedance Value: 532 Ohm
Lead Channel Pacing Threshold Amplitude: 0.75 V
Lead Channel Pacing Threshold Amplitude: 0.75 V
Lead Channel Pacing Threshold Pulse Width: 0.4 ms
Lead Channel Pacing Threshold Pulse Width: 0.4 ms
Lead Channel Sensing Intrinsic Amplitude: 12.75 mV
Lead Channel Sensing Intrinsic Amplitude: 3 mV
Lead Channel Sensing Intrinsic Amplitude: 3 mV
Lead Channel Setting Pacing Amplitude: 1.5 V
Lead Channel Setting Pacing Amplitude: 2.5 V
Lead Channel Setting Pacing Pulse Width: 0.4 ms
Lead Channel Setting Sensing Sensitivity: 2 mV
Zone Setting Status: 755011
Zone Setting Status: 755011

## 2022-07-23 DIAGNOSIS — F4323 Adjustment disorder with mixed anxiety and depressed mood: Secondary | ICD-10-CM | POA: Diagnosis not present

## 2022-07-28 DIAGNOSIS — I10 Essential (primary) hypertension: Secondary | ICD-10-CM | POA: Diagnosis not present

## 2022-07-28 DIAGNOSIS — E1165 Type 2 diabetes mellitus with hyperglycemia: Secondary | ICD-10-CM | POA: Diagnosis not present

## 2022-07-28 DIAGNOSIS — E1121 Type 2 diabetes mellitus with diabetic nephropathy: Secondary | ICD-10-CM | POA: Diagnosis not present

## 2022-07-28 DIAGNOSIS — G4700 Insomnia, unspecified: Secondary | ICD-10-CM | POA: Diagnosis not present

## 2022-07-28 DIAGNOSIS — F411 Generalized anxiety disorder: Secondary | ICD-10-CM | POA: Diagnosis not present

## 2022-07-28 DIAGNOSIS — F331 Major depressive disorder, recurrent, moderate: Secondary | ICD-10-CM | POA: Diagnosis not present

## 2022-07-28 DIAGNOSIS — E782 Mixed hyperlipidemia: Secondary | ICD-10-CM | POA: Diagnosis not present

## 2022-08-06 DIAGNOSIS — F4323 Adjustment disorder with mixed anxiety and depressed mood: Secondary | ICD-10-CM | POA: Diagnosis not present

## 2022-08-11 DIAGNOSIS — Z23 Encounter for immunization: Secondary | ICD-10-CM | POA: Diagnosis not present

## 2022-08-20 DIAGNOSIS — F4323 Adjustment disorder with mixed anxiety and depressed mood: Secondary | ICD-10-CM | POA: Diagnosis not present

## 2022-08-27 NOTE — Progress Notes (Signed)
Remote pacemaker transmission.   

## 2022-08-28 DIAGNOSIS — H04129 Dry eye syndrome of unspecified lacrimal gland: Secondary | ICD-10-CM | POA: Diagnosis not present

## 2022-08-28 DIAGNOSIS — E663 Overweight: Secondary | ICD-10-CM | POA: Diagnosis not present

## 2022-08-28 DIAGNOSIS — M25569 Pain in unspecified knee: Secondary | ICD-10-CM | POA: Diagnosis not present

## 2022-08-28 DIAGNOSIS — M1230 Palindromic rheumatism, unspecified site: Secondary | ICD-10-CM | POA: Diagnosis not present

## 2022-08-28 DIAGNOSIS — M1991 Primary osteoarthritis, unspecified site: Secondary | ICD-10-CM | POA: Diagnosis not present

## 2022-08-28 DIAGNOSIS — Z6826 Body mass index (BMI) 26.0-26.9, adult: Secondary | ICD-10-CM | POA: Diagnosis not present

## 2022-08-28 DIAGNOSIS — R768 Other specified abnormal immunological findings in serum: Secondary | ICD-10-CM | POA: Diagnosis not present

## 2022-09-03 DIAGNOSIS — F4323 Adjustment disorder with mixed anxiety and depressed mood: Secondary | ICD-10-CM | POA: Diagnosis not present

## 2022-09-17 DIAGNOSIS — F4323 Adjustment disorder with mixed anxiety and depressed mood: Secondary | ICD-10-CM | POA: Diagnosis not present

## 2022-10-01 DIAGNOSIS — F4323 Adjustment disorder with mixed anxiety and depressed mood: Secondary | ICD-10-CM | POA: Diagnosis not present

## 2022-10-15 DIAGNOSIS — F4323 Adjustment disorder with mixed anxiety and depressed mood: Secondary | ICD-10-CM | POA: Diagnosis not present

## 2022-10-20 ENCOUNTER — Ambulatory Visit (INDEPENDENT_AMBULATORY_CARE_PROVIDER_SITE_OTHER): Payer: MEDICARE

## 2022-10-20 DIAGNOSIS — E559 Vitamin D deficiency, unspecified: Secondary | ICD-10-CM | POA: Diagnosis not present

## 2022-10-20 DIAGNOSIS — E1165 Type 2 diabetes mellitus with hyperglycemia: Secondary | ICD-10-CM | POA: Diagnosis not present

## 2022-10-20 DIAGNOSIS — I10 Essential (primary) hypertension: Secondary | ICD-10-CM | POA: Diagnosis not present

## 2022-10-20 DIAGNOSIS — I441 Atrioventricular block, second degree: Secondary | ICD-10-CM

## 2022-10-20 LAB — CUP PACEART REMOTE DEVICE CHECK
Battery Remaining Longevity: 133 mo
Battery Voltage: 3.04 V
Brady Statistic AP VP Percent: 3.11 %
Brady Statistic AP VS Percent: 0 %
Brady Statistic AS VP Percent: 96.76 %
Brady Statistic AS VS Percent: 0.12 %
Brady Statistic RA Percent Paced: 3.1 %
Brady Statistic RV Percent Paced: 99.88 %
Date Time Interrogation Session: 20240616224007
Implantable Lead Connection Status: 753985
Implantable Lead Connection Status: 753985
Implantable Lead Implant Date: 20230615
Implantable Lead Implant Date: 20230615
Implantable Lead Location: 753859
Implantable Lead Location: 753860
Implantable Lead Model: 3830
Implantable Lead Model: 5076
Implantable Pulse Generator Implant Date: 20230615
Lead Channel Impedance Value: 323 Ohm
Lead Channel Impedance Value: 361 Ohm
Lead Channel Impedance Value: 475 Ohm
Lead Channel Impedance Value: 532 Ohm
Lead Channel Pacing Threshold Amplitude: 0.75 V
Lead Channel Pacing Threshold Amplitude: 0.75 V
Lead Channel Pacing Threshold Pulse Width: 0.4 ms
Lead Channel Pacing Threshold Pulse Width: 0.4 ms
Lead Channel Sensing Intrinsic Amplitude: 14.75 mV
Lead Channel Sensing Intrinsic Amplitude: 14.75 mV
Lead Channel Sensing Intrinsic Amplitude: 3.375 mV
Lead Channel Sensing Intrinsic Amplitude: 3.375 mV
Lead Channel Setting Pacing Amplitude: 1.5 V
Lead Channel Setting Pacing Amplitude: 2.5 V
Lead Channel Setting Pacing Pulse Width: 0.4 ms
Lead Channel Setting Sensing Sensitivity: 2 mV
Zone Setting Status: 755011
Zone Setting Status: 755011

## 2022-10-23 DIAGNOSIS — E1165 Type 2 diabetes mellitus with hyperglycemia: Secondary | ICD-10-CM | POA: Diagnosis not present

## 2022-10-23 DIAGNOSIS — R739 Hyperglycemia, unspecified: Secondary | ICD-10-CM | POA: Diagnosis not present

## 2022-10-23 DIAGNOSIS — E559 Vitamin D deficiency, unspecified: Secondary | ICD-10-CM | POA: Diagnosis not present

## 2022-10-23 DIAGNOSIS — I10 Essential (primary) hypertension: Secondary | ICD-10-CM | POA: Diagnosis not present

## 2022-10-29 DIAGNOSIS — Z9181 History of falling: Secondary | ICD-10-CM | POA: Diagnosis not present

## 2022-10-29 DIAGNOSIS — F4323 Adjustment disorder with mixed anxiety and depressed mood: Secondary | ICD-10-CM | POA: Diagnosis not present

## 2022-10-29 DIAGNOSIS — Z0000 Encounter for general adult medical examination without abnormal findings: Secondary | ICD-10-CM | POA: Diagnosis not present

## 2022-10-29 DIAGNOSIS — Z1339 Encounter for screening examination for other mental health and behavioral disorders: Secondary | ICD-10-CM | POA: Diagnosis not present

## 2022-10-29 DIAGNOSIS — Z1331 Encounter for screening for depression: Secondary | ICD-10-CM | POA: Diagnosis not present

## 2022-10-29 DIAGNOSIS — H9192 Unspecified hearing loss, left ear: Secondary | ICD-10-CM | POA: Diagnosis not present

## 2022-11-07 ENCOUNTER — Ambulatory Visit: Payer: MEDICARE | Admitting: Cardiology

## 2022-11-07 ENCOUNTER — Encounter: Payer: Self-pay | Admitting: Cardiology

## 2022-11-07 VITALS — BP 129/65 | HR 55 | Wt 184.4 lb

## 2022-11-07 DIAGNOSIS — I10 Essential (primary) hypertension: Secondary | ICD-10-CM | POA: Diagnosis not present

## 2022-11-07 DIAGNOSIS — I2510 Atherosclerotic heart disease of native coronary artery without angina pectoris: Secondary | ICD-10-CM

## 2022-11-07 NOTE — Progress Notes (Addendum)
Follow up visit  Subjective:   Phillip Allen, male    DOB: 1941/04/14, 82 y.o.   MRN: [id number]   HPI  82 y.o. 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 He does not do much in terms of physical activity. Pacemaker is working well.   Current Outpatient Medications:    aspirin EC 81 MG tablet, Take 1 tablet (81 mg total) by mouth daily., Disp: 30 tablet, Rfl: 3   Collagen-Boron-Hyaluronic Acid (MOVE FREE ULTRA JOINT HEALTH PO), Take 1 tablet by mouth daily., Disp: , Rfl:    diclofenac Sodium (VOLTAREN) 1 % GEL, Apply 1 application  topically 4 (four) times daily as needed (pain)., Disp: , Rfl:    docusate sodium (COLACE) 100 MG capsule, Take 100 mg by mouth 2 (two) times daily., Disp: , Rfl:    Krill Oil 500 MG CAPS, Take 500 mg by mouth daily., Disp: , Rfl:    losartan (COZAAR) 25 MG tablet, Take 12.5 mg by mouth daily., Disp: , Rfl:    metoprolol succinate (TOPROL XL) 25 MG 24 hr tablet, Take 1 tablet (25 mg total) by mouth daily., Disp: 30 tablet, Rfl: 0   Multiple Vitamins-Minerals (MULTIVITAMIN MEN 50+ PO), Take 1 tablet by mouth daily., Disp: , Rfl:    nitroGLYCERIN (NITROSTAT) 0.4 MG SL tablet, Place 1 tablet (0.4 mg total) under the tongue every 5 (five) minutes as needed for chest pain., Disp: 30 tablet, Rfl: 3   Polyvinyl Alcohol-Povidone (REFRESH OP), Place 1 drop into both eyes daily as needed (dry eyes)., Disp: , Rfl:    rosuvastatin (CRESTOR) 40 MG tablet, Take 40 mg by mouth daily., Disp: , Rfl:    vortioxetine HBr (TRINTELLIX) 20 MG TABS tablet, Take 20 mg by mouth daily., Disp: , Rfl:    venlafaxine XR (EFFEXOR-XR) 37.5 MG 24 hr capsule, Take 37.5 mg by mouth daily. (Patient not taking: Reported on 11/07/2022), Disp: , Rfl:   Cardiovascular & other pertient studies:  EKG 05/09/2022: Ventricular paced  rhythm  Pacemaker implantation 10/17/2021 (Dr. Ladona Ridgel): CONCLUSIONS:   1. 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 No early apparent complications.   Echocardiogram 10/16/2021:   1.Left ventricular ejection fraction, by estimation, is 60 to 65%. The  left ventricle has normal function. The left ventricle has no regional  wall motion abnormalities. Left ventricular diastolic function could not  be evaluated.   2. Right ventricular systolic function is normal. The right ventricular  size is normal.   3. Left atrial size was mildly dilated.   4. Right atrial size was mildly dilated.   5. The mitral valve is normal in structure. Mild mitral valve  regurgitation. No evidence of mitral stenosis.   6. Tricuspid valve regurgitation is moderate.   7. Inadequate Doppler assessment of aortic valve, unlikely to have  signifciant stenosis.. The aortic valve is tricuspid. There is mild  calcification of the aortic valve. Aortic valve regurgitation is mild.  Aortic valve sclerosis is present, with no  evidence of aortic valve stenosis.   Comparison(s): No significant change compared to previous study on  08/01/2020.   Coronary angiography 10/16/2021: LM Normal LAD: Prox 40% disease LCx: Ostial 50% stenosis        PM1 prox occlusion, possibly subacute  RCA: RPLB flush occlusion (chronic)   Attempted but unsuccessful percutaneous coronary intervention OM1   Temporary pacemaker placement Plan to perform PPM on 6/15.  Recent labs: 10/21/2022: Glucose 90, BUN/Cr 16/0.75. EGFR 91. Na/K 139/4.7. Rest of the CMP normal HbA1C 5.6% Chol 146, TG 124, HDL 58, LDL 63 TSH 3.8 normal  10/17/2021: Glucose 97, BUN/Cr 14/0.64. EGFR >60. Na/K 139/3.8. Rest of the CMP normal BNP 249 H/H 13/39. MCV 94. Platelets 221 TSH 3.2 normal Lipoprotein (a) 99   Review of Systems  Constitutional: Positive for  malaise/fatigue.  Cardiovascular:  Negative for chest pain, dyspnea on exertion, leg swelling, palpitations and syncope.         Vitals:   11/07/22 1029  BP: 129/65  Pulse: (!) 55  SpO2: 95%      Objective:   Physical Exam Vitals and nursing note reviewed.  Constitutional:      General: He is not in acute distress. Neck:     Vascular: No JVD.  Cardiovascular:     Rate and Rhythm: Normal rate and regular rhythm.     Heart sounds: Normal heart sounds. No murmur heard. Pulmonary:     Effort: Pulmonary effort is normal.     Breath sounds: Normal breath sounds. No wheezing or rales.  Chest:     Comments: Pacemaker scar well healed Musculoskeletal:     Right lower leg: No edema.     Left lower leg: No edema.       Assessment & Recommendations:   82 y.o. 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 Recommend medical management. Continue Aspirin, statin, metoprolol, amlodipine. Lipoprotein a is only minimally elevated. Lipids well controlled.  High grade AV block: Now s/p PPM placement  Hypertension: Controlled.  Mixed hyperlipidemia: Continue Crestor 40 mg daily.  F/u in 6 months   Elder Negus, MD Pager: [phone] Office: [phone]

## 2022-11-07 NOTE — Progress Notes (Signed)
Remote pacemaker transmission.   

## 2022-11-12 DIAGNOSIS — F4323 Adjustment disorder with mixed anxiety and depressed mood: Secondary | ICD-10-CM | POA: Diagnosis not present

## 2022-11-25 DIAGNOSIS — F4323 Adjustment disorder with mixed anxiety and depressed mood: Secondary | ICD-10-CM | POA: Diagnosis not present

## 2022-12-03 DIAGNOSIS — F4323 Adjustment disorder with mixed anxiety and depressed mood: Secondary | ICD-10-CM | POA: Diagnosis not present

## 2022-12-09 DIAGNOSIS — M25812 Other specified joint disorders, left shoulder: Secondary | ICD-10-CM | POA: Diagnosis not present

## 2022-12-09 DIAGNOSIS — G4700 Insomnia, unspecified: Secondary | ICD-10-CM | POA: Diagnosis not present

## 2022-12-09 DIAGNOSIS — F411 Generalized anxiety disorder: Secondary | ICD-10-CM | POA: Diagnosis not present

## 2022-12-09 DIAGNOSIS — F331 Major depressive disorder, recurrent, moderate: Secondary | ICD-10-CM | POA: Diagnosis not present

## 2022-12-10 ENCOUNTER — Other Ambulatory Visit: Payer: Self-pay | Admitting: Family Medicine

## 2022-12-10 ENCOUNTER — Ambulatory Visit
Admission: RE | Admit: 2022-12-10 | Discharge: 2022-12-10 | Disposition: A | Discharge status: Home / Self Care | Payer: MEDICARE | Source: Ambulatory Visit | Attending: Family Medicine | Admitting: Family Medicine

## 2022-12-10 DIAGNOSIS — M25812 Other specified joint disorders, left shoulder: Secondary | ICD-10-CM

## 2022-12-10 DIAGNOSIS — M7542 Impingement syndrome of left shoulder: Secondary | ICD-10-CM | POA: Diagnosis not present

## 2022-12-16 DIAGNOSIS — Z23 Encounter for immunization: Secondary | ICD-10-CM | POA: Diagnosis not present

## 2022-12-22 DIAGNOSIS — Z1211 Encounter for screening for malignant neoplasm of colon: Secondary | ICD-10-CM | POA: Diagnosis not present

## 2022-12-24 DIAGNOSIS — F4323 Adjustment disorder with mixed anxiety and depressed mood: Secondary | ICD-10-CM | POA: Diagnosis not present

## 2023-01-07 DIAGNOSIS — Z7185 Encounter for immunization safety counseling: Secondary | ICD-10-CM | POA: Diagnosis not present

## 2023-01-07 DIAGNOSIS — M25812 Other specified joint disorders, left shoulder: Secondary | ICD-10-CM | POA: Diagnosis not present

## 2023-01-07 DIAGNOSIS — Z23 Encounter for immunization: Secondary | ICD-10-CM | POA: Diagnosis not present

## 2023-01-07 DIAGNOSIS — K5900 Constipation, unspecified: Secondary | ICD-10-CM | POA: Diagnosis not present

## 2023-01-07 DIAGNOSIS — F4323 Adjustment disorder with mixed anxiety and depressed mood: Secondary | ICD-10-CM | POA: Diagnosis not present

## 2023-01-07 DIAGNOSIS — R195 Other fecal abnormalities: Secondary | ICD-10-CM | POA: Diagnosis not present

## 2023-01-12 ENCOUNTER — Emergency Department (HOSPITAL_BASED_OUTPATIENT_CLINIC_OR_DEPARTMENT_OTHER)
Admission: EM | Admit: 2023-01-12 | Discharge: 2023-01-13 | Disposition: A | Discharge status: Home / Self Care | Payer: MEDICARE | Attending: Emergency Medicine | Admitting: Emergency Medicine

## 2023-01-12 ENCOUNTER — Encounter (HOSPITAL_BASED_OUTPATIENT_CLINIC_OR_DEPARTMENT_OTHER): Payer: Self-pay

## 2023-01-12 DIAGNOSIS — Z87891 Personal history of nicotine dependence: Secondary | ICD-10-CM | POA: Diagnosis not present

## 2023-01-12 DIAGNOSIS — R0789 Other chest pain: Secondary | ICD-10-CM | POA: Diagnosis not present

## 2023-01-12 DIAGNOSIS — I2510 Atherosclerotic heart disease of native coronary artery without angina pectoris: Secondary | ICD-10-CM | POA: Diagnosis not present

## 2023-01-12 DIAGNOSIS — R42 Dizziness and giddiness: Secondary | ICD-10-CM | POA: Insufficient documentation

## 2023-01-12 DIAGNOSIS — R079 Chest pain, unspecified: Secondary | ICD-10-CM

## 2023-01-12 DIAGNOSIS — R61 Generalized hyperhidrosis: Secondary | ICD-10-CM | POA: Insufficient documentation

## 2023-01-12 NOTE — ED Triage Notes (Signed)
Pt states that he began to have CP around 9pm with some nausea and dizziness. Pt has a Research officer, political party

## 2023-01-13 ENCOUNTER — Emergency Department (HOSPITAL_BASED_OUTPATIENT_CLINIC_OR_DEPARTMENT_OTHER): Payer: MEDICARE

## 2023-01-13 DIAGNOSIS — D1801 Hemangioma of skin and subcutaneous tissue: Secondary | ICD-10-CM | POA: Diagnosis not present

## 2023-01-13 DIAGNOSIS — R079 Chest pain, unspecified: Secondary | ICD-10-CM | POA: Diagnosis not present

## 2023-01-13 DIAGNOSIS — R9389 Abnormal findings on diagnostic imaging of other specified body structures: Secondary | ICD-10-CM | POA: Diagnosis not present

## 2023-01-13 DIAGNOSIS — C44519 Basal cell carcinoma of skin of other part of trunk: Secondary | ICD-10-CM | POA: Diagnosis not present

## 2023-01-13 DIAGNOSIS — Z950 Presence of cardiac pacemaker: Secondary | ICD-10-CM | POA: Diagnosis not present

## 2023-01-13 DIAGNOSIS — L821 Other seborrheic keratosis: Secondary | ICD-10-CM | POA: Diagnosis not present

## 2023-01-13 DIAGNOSIS — L570 Actinic keratosis: Secondary | ICD-10-CM | POA: Diagnosis not present

## 2023-01-13 DIAGNOSIS — Z85828 Personal history of other malignant neoplasm of skin: Secondary | ICD-10-CM | POA: Diagnosis not present

## 2023-01-13 DIAGNOSIS — R1013 Epigastric pain: Secondary | ICD-10-CM | POA: Diagnosis not present

## 2023-01-13 DIAGNOSIS — D485 Neoplasm of uncertain behavior of skin: Secondary | ICD-10-CM | POA: Diagnosis not present

## 2023-01-13 DIAGNOSIS — D225 Melanocytic nevi of trunk: Secondary | ICD-10-CM | POA: Diagnosis not present

## 2023-01-13 LAB — CBC
HCT: 43.1 % (ref 39.0–52.0)
Hemoglobin: 14.5 g/dL (ref 13.0–17.0)
MCH: 32.3 pg (ref 26.0–34.0)
MCHC: 33.6 g/dL (ref 30.0–36.0)
MCV: 96 fL (ref 80.0–100.0)
Platelets: 231 10*3/uL (ref 150–400)
RBC: 4.49 MIL/uL (ref 4.22–5.81)
RDW: 12.3 % (ref 11.5–15.5)
WBC: 12.1 10*3/uL — ABNORMAL HIGH (ref 4.0–10.5)
nRBC: 0 % (ref 0.0–0.2)

## 2023-01-13 LAB — COMPREHENSIVE METABOLIC PANEL
ALT: 55 U/L — ABNORMAL HIGH (ref 0–44)
AST: 96 U/L — ABNORMAL HIGH (ref 15–41)
Albumin: 4.2 g/dL (ref 3.5–5.0)
Alkaline Phosphatase: 64 U/L (ref 38–126)
Anion gap: 12 (ref 5–15)
BUN: 23 mg/dL (ref 8–23)
CO2: 28 mmol/L (ref 22–32)
Calcium: 9.7 mg/dL (ref 8.9–10.3)
Chloride: 98 mmol/L (ref 98–111)
Creatinine, Ser: 0.84 mg/dL (ref 0.61–1.24)
GFR, Estimated: >60 mL/min (ref >60)
Glucose, Bld: 158 mg/dL — ABNORMAL HIGH (ref 70–99)
Potassium: 3.7 mmol/L (ref 3.5–5.1)
Sodium: 138 mmol/L (ref 135–145)
Total Bilirubin: 1.2 mg/dL (ref 0.3–1.2)
Total Protein: 6.9 g/dL (ref 6.5–8.1)

## 2023-01-13 LAB — LIPASE, BLOOD: Lipase: 46 U/L (ref 11–51)

## 2023-01-13 LAB — TROPONIN I (HIGH SENSITIVITY)
Troponin I (High Sensitivity): 10 ng/L (ref <18)
Troponin I (High Sensitivity): 10 ng/L (ref <18)

## 2023-01-13 NOTE — Discharge Instructions (Addendum)
You were evaluated in the Emergency Department and after careful evaluation, we did not find any emergent condition requiring admission or further testing in the hospital.  Your exam/testing today is overall reassuring.  Recommend follow-up with your cardiologist to discuss your symptoms.  Please return to the Emergency Department if you experience any worsening of your condition.   Thank you for allowing us to be a part of your care. 

## 2023-01-13 NOTE — ED Provider Notes (Signed)
[facility] Emergency Department Provider Note MRN:  [id number]  Arrival date & time: 01/13/23     Chief Complaint   Chest Pain   History of Present Illness   Phillip Allen is a 82 y.o. year-old male with a history of CAD presenting to the ED with chief complaint of chest pain.  Sudden onset chest pain at 8:30 PM, associated with lightheadedness, diaphoresis, nausea.  Symptoms have since resolved, feeling a lot better.  No recent fever or cough, no recent leg pain or swelling.  Pain located in the lower chest/upper abdomen, described as a charcoal burning pain.  Review of Systems  A thorough review of systems was obtained and all systems are negative except as noted in the HPI and PMH.   Patient's Health History    Past Medical History:  Diagnosis Date   Depression    Hyperlipidemia     Past Surgical History:  Procedure Laterality Date   CORONARY STENT INTERVENTION N/A 10/16/2021   Procedure: CORONARY STENT INTERVENTION;  Surgeon: Elder Negus, MD;  Location: [facility] INVASIVE CV LAB;  Service: Cardiovascular;  Laterality: N/A;   LEFT HEART CATH AND CORONARY ANGIOGRAPHY N/A 10/16/2021   Procedure: LEFT HEART CATH AND CORONARY ANGIOGRAPHY;  Surgeon: Elder Negus, MD;  Location: [facility] INVASIVE CV LAB;  Service: Cardiovascular;  Laterality: N/A;   PACEMAKER IMPLANT N/A 10/17/2021   Procedure: PACEMAKER IMPLANT;  Surgeon: Marinus Maw, MD;  Location: [facility] INVASIVE CV LAB;  Service: Cardiovascular;  Laterality: N/A;   SHOULDER SURGERY Right 06/2017   TEMPORARY PACEMAKER N/A 10/16/2021   Procedure: TEMPORARY PACEMAKER;  Surgeon: Elder Negus, MD;  Location: [facility] INVASIVE CV LAB;  Service: Cardiovascular;  Laterality: N/A;    Family History  Problem Relation Age of Onset   Breast cancer Mother    Stomach cancer Mother    Lung cancer Father    Heart attack Brother     Social History   Socioeconomic History   Marital status: Widowed    Spouse  name: Not on file   Number of children: 2   Years of education: Not on file   Highest education level: Not on file  Occupational History   Not on file  Tobacco Use   Smoking status: Former    Current packs/day: 0.00    Average packs/day: 0.3 packs/day for 10.0 years (2.5 ttl pk-yrs)    Types: Cigarettes    Start date: 32    Quit date: 50    Years since quitting: 28.7   Smokeless tobacco: Never   Tobacco comments:    on/off since age 76  Vaping Use   Vaping status: Never Used  Substance and Sexual Activity   Alcohol use: Not Currently    Comment: has not drank any alcohol since Oct 2021   Drug use: Never   Sexual activity: Not on file  Other Topics Concern   Not on file  Social History Narrative   Not on file   Social Determinants of Health   Financial Resource Strain: Not on file  Food Insecurity: Not on file  Transportation Needs: Not on file  Physical Activity: Not on file  Stress: Not on file  Social Connections: Unknown (01/21/2022)   Received from [facility]   Social Network    Social Network: Not on file  Intimate Partner Violence: Unknown (01/21/2022)   Received from [facility], [facility]   HITS    Physically Hurt: Not on file  Insult or Talk Down To: Not on file    Threaten Physical Harm: Not on file    Scream or Curse: Not on file     Physical Exam   Vitals:   01/13/23 0300 01/13/23 0315  BP: 137/60   Pulse: 73 66  Resp: 14 15  Temp:    SpO2: 96% 93%    CONSTITUTIONAL: Well-appearing, NAD NEURO/PSYCH:  Alert and oriented x 3, no focal deficits EYES:  eyes equal and reactive ENT/NECK:  no LAD, no JVD CARDIO: Regular rate, well-perfused, normal S1 and S2 PULM:  CTAB no wheezing or rhonchi GI/GU:  non-distended, non-tender MSK/SPINE:  No gross deformities, no edema SKIN:  no rash, atraumatic   *Additional and/or pertinent findings included in MDM below  Diagnostic and Interventional Summary    EKG  Interpretation Date/Time:  Monday January 12 2023 23:39:46 EDT Ventricular Rate:  63 PR Interval:  192 QRS Duration:  158 QT Interval:  476 QTC Calculation: 487 R Axis:   -63  Text Interpretation: Atrial-sensed ventricular-paced rhythm Abnormal ECG When compared with ECG of 18-Oct-2021 06:49, Vent. rate has increased BY   5 BPM Confirmed by Kennis Carina [phone]) on 01/13/2023 12:15:07 AM       Labs Reviewed  CBC - Abnormal; Notable for the following components:      Result Value   WBC 12.1 (*)    All other components within normal limits  COMPREHENSIVE METABOLIC PANEL - Abnormal; Notable for the following components:   Glucose, Bld 158 (*)    AST 96 (*)    ALT 55 (*)    All other components within normal limits  LIPASE, BLOOD  URINALYSIS, ROUTINE W REFLEX MICROSCOPIC  TROPONIN I (HIGH SENSITIVITY)  TROPONIN I (HIGH SENSITIVITY)    DG Chest Port 1 View  Final Result      Medications - No data to display   Procedures  /  Critical Care Procedures  ED Course and Medical Decision Making  Initial Impression and Ddx History of CAD, history of pacemaker here with chest pain.  Favoring more of a GI etiology given the burning characteristic and the location of the pain however patient does have a cardiac history with attempted stent placement last year.  Currently without pain, abdomen completely soft and nontender with no rebound guarding or rigidity.  Doubt emergent intra-abdominal pathology.  CT abdomen was ordered in triage but I have since canceled it, does not seem indicated at this time.  Past medical/surgical history that increases complexity of ED encounter: CAD  Interpretation of Diagnostics I personally reviewed the EKG and my interpretation is as follows: Sinus rhythm without concerning ischemic changes  Labs reassuring with no significant blood count or electrolyte disturbance.  Troponin negative x 2  Patient Reassessment and Ultimate Disposition/Management      Patient has been pain-free here in the emergency department for 5+ hours.  Favoring noncardiac pain, likely GI.  Still given patient's high heart score admission was offered, patient would rather go home.  I messaged his cardiologist to expedite follow-up.  Appropriate for discharge.  Patient management required discussion with the following services or consulting groups:  None  Complexity of Problems Addressed Acute illness or injury that poses threat of life of bodily function  Additional Data Reviewed and Analyzed Further history obtained from: Further history from spouse/family member  Additional Factors Impacting ED Encounter Risk None  Elmer Sow. Pilar Plate, MD [facility] Emergency Medicine [facility] [email or web address]  Final Clinical Impressions(s) /  ED Diagnoses     ICD-10-CM   1. Chest pain, unspecified type  R07.9       ED Discharge Orders     None        Discharge Instructions Discussed with and Provided to Patient:     Discharge Instructions      You were evaluated in the Emergency Department and after careful evaluation, we did not find any emergent condition requiring admission or further testing in the hospital.  Your exam/testing today is overall reassuring.  Recommend follow-up with your cardiologist to discuss your symptoms.  Please return to the Emergency Department if you experience any worsening of your condition.   Thank you for allowing [place] to be a part of your care.       Sabas Sous, MD 01/13/23 [phone]

## 2023-01-13 NOTE — ED Notes (Signed)
Cab voucher given to pt by charge nurse after several attempts made to call pt's ride. Pt d/c'd and placed in waiting room.

## 2023-01-19 ENCOUNTER — Ambulatory Visit (INDEPENDENT_AMBULATORY_CARE_PROVIDER_SITE_OTHER): Payer: MEDICARE

## 2023-01-19 DIAGNOSIS — I441 Atrioventricular block, second degree: Secondary | ICD-10-CM

## 2023-01-19 LAB — CUP PACEART REMOTE DEVICE CHECK
Battery Remaining Longevity: 133 mo
Battery Voltage: 3.02 V
Brady Statistic AP VP Percent: 3.42 %
Brady Statistic AP VS Percent: 0 %
Brady Statistic AS VP Percent: 96.57 %
Brady Statistic AS VS Percent: 0.01 %
Brady Statistic RA Percent Paced: 3.4 %
Brady Statistic RV Percent Paced: 99.99 %
Date Time Interrogation Session: 20240916023843
Implantable Lead Connection Status: 753985
Implantable Lead Connection Status: 753985
Implantable Lead Implant Date: 20230615
Implantable Lead Implant Date: 20230615
Implantable Lead Location: 753859
Implantable Lead Location: 753860
Implantable Lead Model: 3830
Implantable Lead Model: 5076
Implantable Pulse Generator Implant Date: 20230615
Lead Channel Impedance Value: 361 Ohm
Lead Channel Impedance Value: 361 Ohm
Lead Channel Impedance Value: 513 Ohm
Lead Channel Impedance Value: 532 Ohm
Lead Channel Pacing Threshold Amplitude: 0.625 V
Lead Channel Pacing Threshold Amplitude: 0.75 V
Lead Channel Pacing Threshold Pulse Width: 0.4 ms
Lead Channel Pacing Threshold Pulse Width: 0.4 ms
Lead Channel Sensing Intrinsic Amplitude: 14.75 mV
Lead Channel Sensing Intrinsic Amplitude: 14.75 mV
Lead Channel Sensing Intrinsic Amplitude: 3.625 mV
Lead Channel Sensing Intrinsic Amplitude: 3.625 mV
Lead Channel Setting Pacing Amplitude: 1.5 V
Lead Channel Setting Pacing Amplitude: 2.5 V
Lead Channel Setting Pacing Pulse Width: 0.4 ms
Lead Channel Setting Sensing Sensitivity: 2 mV
Zone Setting Status: 755011
Zone Setting Status: 755011

## 2023-01-21 DIAGNOSIS — F4323 Adjustment disorder with mixed anxiety and depressed mood: Secondary | ICD-10-CM | POA: Diagnosis not present

## 2023-01-26 DIAGNOSIS — R7989 Other specified abnormal findings of blood chemistry: Secondary | ICD-10-CM | POA: Diagnosis not present

## 2023-01-26 DIAGNOSIS — I10 Essential (primary) hypertension: Secondary | ICD-10-CM | POA: Diagnosis not present

## 2023-02-02 NOTE — Progress Notes (Signed)
Remote pacemaker transmission.   

## 2023-02-12 ENCOUNTER — Telehealth: Payer: Self-pay

## 2023-02-12 NOTE — Telephone Encounter (Signed)
Transition Care Management Unsuccessful Follow-up Telephone Call  Date of discharge and from where:  [facility] 9/10  Attempts:  1st Attempt  Reason for unsuccessful TCM follow-up call:  No answer/busy   [facility]  [facility], [facility] Guide, Phone: [phone] Website: [email or web address]

## 2023-02-12 NOTE — Telephone Encounter (Signed)
Transition Care Management Unsuccessful Follow-up Telephone Call  Date of discharge and from where:  [facility] 9/10  Attempts:  2nd  Reason for unsuccessful TCM follow-up call:  No answer/busy   [facility]  [facility], [facility], Phone: [phone] Website: [email or web address]

## 2023-02-18 DIAGNOSIS — F4323 Adjustment disorder with mixed anxiety and depressed mood: Secondary | ICD-10-CM | POA: Diagnosis not present

## 2023-03-04 DIAGNOSIS — F4323 Adjustment disorder with mixed anxiety and depressed mood: Secondary | ICD-10-CM | POA: Diagnosis not present

## 2023-03-18 DIAGNOSIS — F4323 Adjustment disorder with mixed anxiety and depressed mood: Secondary | ICD-10-CM | POA: Diagnosis not present

## 2023-04-01 DIAGNOSIS — F4323 Adjustment disorder with mixed anxiety and depressed mood: Secondary | ICD-10-CM | POA: Diagnosis not present

## 2023-04-15 DIAGNOSIS — F4323 Adjustment disorder with mixed anxiety and depressed mood: Secondary | ICD-10-CM | POA: Diagnosis not present

## 2023-04-20 ENCOUNTER — Ambulatory Visit (INDEPENDENT_AMBULATORY_CARE_PROVIDER_SITE_OTHER): Payer: MEDICARE

## 2023-04-20 DIAGNOSIS — I441 Atrioventricular block, second degree: Secondary | ICD-10-CM

## 2023-04-20 LAB — CUP PACEART REMOTE DEVICE CHECK
Battery Remaining Longevity: 128 mo
Battery Voltage: 3.02 V
Brady Statistic AP VP Percent: 1.7 %
Brady Statistic AP VS Percent: 0 %
Brady Statistic AS VP Percent: 98.28 %
Brady Statistic AS VS Percent: 0.02 %
Brady Statistic RA Percent Paced: 1.69 %
Brady Statistic RV Percent Paced: 99.98 %
Date Time Interrogation Session: 20241215233304
Implantable Lead Connection Status: 753985
Implantable Lead Connection Status: 753985
Implantable Lead Implant Date: 20230615
Implantable Lead Implant Date: 20230615
Implantable Lead Location: 753859
Implantable Lead Location: 753860
Implantable Lead Model: 3830
Implantable Lead Model: 5076
Implantable Pulse Generator Implant Date: 20230615
Lead Channel Impedance Value: 323 Ohm
Lead Channel Impedance Value: 342 Ohm
Lead Channel Impedance Value: 494 Ohm
Lead Channel Impedance Value: 513 Ohm
Lead Channel Pacing Threshold Amplitude: 0.625 V
Lead Channel Pacing Threshold Amplitude: 0.625 V
Lead Channel Pacing Threshold Pulse Width: 0.4 ms
Lead Channel Pacing Threshold Pulse Width: 0.4 ms
Lead Channel Sensing Intrinsic Amplitude: 14.75 mV
Lead Channel Sensing Intrinsic Amplitude: 14.75 mV
Lead Channel Sensing Intrinsic Amplitude: 3 mV
Lead Channel Sensing Intrinsic Amplitude: 3 mV
Lead Channel Setting Pacing Amplitude: 1.5 V
Lead Channel Setting Pacing Amplitude: 2.5 V
Lead Channel Setting Pacing Pulse Width: 0.4 ms
Lead Channel Setting Sensing Sensitivity: 2 mV
Zone Setting Status: 755011
Zone Setting Status: 755011

## 2023-05-11 ENCOUNTER — Other Ambulatory Visit: Payer: MEDICARE

## 2023-05-11 ENCOUNTER — Other Ambulatory Visit: Payer: Self-pay

## 2023-05-11 DIAGNOSIS — I25118 Atherosclerotic heart disease of native coronary artery with other forms of angina pectoris: Secondary | ICD-10-CM

## 2023-05-11 MED ORDER — NITROGLYCERIN 0.4 MG SL SUBL: 0.4000 mg | SUBLINGUAL_TABLET | SUBLINGUAL | 6 refills | Status: AC | PRN

## 2023-05-13 DIAGNOSIS — F4323 Adjustment disorder with mixed anxiety and depressed mood: Secondary | ICD-10-CM | POA: Diagnosis not present

## 2023-05-14 ENCOUNTER — Ambulatory Visit
Admission: RE | Admit: 2023-05-14 | Discharge: 2023-05-14 | Disposition: A | Discharge status: Home / Self Care | Payer: MEDICARE | Source: Ambulatory Visit | Attending: Pulmonary Disease | Admitting: Pulmonary Disease

## 2023-05-14 DIAGNOSIS — I2510 Atherosclerotic heart disease of native coronary artery without angina pectoris: Secondary | ICD-10-CM | POA: Diagnosis not present

## 2023-05-14 DIAGNOSIS — J986 Disorders of diaphragm: Secondary | ICD-10-CM | POA: Diagnosis not present

## 2023-05-14 DIAGNOSIS — R911 Solitary pulmonary nodule: Secondary | ICD-10-CM

## 2023-05-22 DIAGNOSIS — E785 Hyperlipidemia, unspecified: Secondary | ICD-10-CM | POA: Diagnosis not present

## 2023-05-22 DIAGNOSIS — E559 Vitamin D deficiency, unspecified: Secondary | ICD-10-CM | POA: Diagnosis not present

## 2023-05-22 DIAGNOSIS — R5383 Other fatigue: Secondary | ICD-10-CM | POA: Diagnosis not present

## 2023-05-25 NOTE — Progress Notes (Signed)
Remote pacemaker transmission.   

## 2023-05-27 DIAGNOSIS — K219 Gastro-esophageal reflux disease without esophagitis: Secondary | ICD-10-CM | POA: Diagnosis not present

## 2023-05-27 DIAGNOSIS — E782 Mixed hyperlipidemia: Secondary | ICD-10-CM | POA: Diagnosis not present

## 2023-05-27 DIAGNOSIS — I10 Essential (primary) hypertension: Secondary | ICD-10-CM | POA: Diagnosis not present

## 2023-05-27 DIAGNOSIS — E559 Vitamin D deficiency, unspecified: Secondary | ICD-10-CM | POA: Diagnosis not present

## 2023-05-27 DIAGNOSIS — Z789 Other specified health status: Secondary | ICD-10-CM | POA: Diagnosis not present

## 2023-05-27 DIAGNOSIS — R739 Hyperglycemia, unspecified: Secondary | ICD-10-CM | POA: Diagnosis not present

## 2023-05-27 DIAGNOSIS — F4323 Adjustment disorder with mixed anxiety and depressed mood: Secondary | ICD-10-CM | POA: Diagnosis not present

## 2023-06-01 ENCOUNTER — Ambulatory Visit: Payer: MEDICARE | Admitting: Pulmonary Disease

## 2023-06-04 DIAGNOSIS — E1165 Type 2 diabetes mellitus with hyperglycemia: Secondary | ICD-10-CM | POA: Diagnosis not present

## 2023-06-04 DIAGNOSIS — R635 Abnormal weight gain: Secondary | ICD-10-CM | POA: Diagnosis not present

## 2023-06-10 DIAGNOSIS — F4323 Adjustment disorder with mixed anxiety and depressed mood: Secondary | ICD-10-CM | POA: Diagnosis not present

## 2023-06-17 DIAGNOSIS — F331 Major depressive disorder, recurrent, moderate: Secondary | ICD-10-CM | POA: Diagnosis not present

## 2023-06-17 DIAGNOSIS — G4700 Insomnia, unspecified: Secondary | ICD-10-CM | POA: Diagnosis not present

## 2023-06-17 DIAGNOSIS — F411 Generalized anxiety disorder: Secondary | ICD-10-CM | POA: Diagnosis not present

## 2023-06-24 DIAGNOSIS — F4323 Adjustment disorder with mixed anxiety and depressed mood: Secondary | ICD-10-CM | POA: Diagnosis not present

## 2023-07-08 DIAGNOSIS — F4323 Adjustment disorder with mixed anxiety and depressed mood: Secondary | ICD-10-CM | POA: Diagnosis not present

## 2023-07-13 DIAGNOSIS — I10 Essential (primary) hypertension: Secondary | ICD-10-CM | POA: Diagnosis not present

## 2023-07-15 DIAGNOSIS — E1122 Type 2 diabetes mellitus with diabetic chronic kidney disease: Secondary | ICD-10-CM | POA: Diagnosis not present

## 2023-07-15 DIAGNOSIS — I10 Essential (primary) hypertension: Secondary | ICD-10-CM | POA: Diagnosis not present

## 2023-07-15 DIAGNOSIS — I129 Hypertensive chronic kidney disease with stage 1 through stage 4 chronic kidney disease, or unspecified chronic kidney disease: Secondary | ICD-10-CM | POA: Diagnosis not present

## 2023-07-15 DIAGNOSIS — E1121 Type 2 diabetes mellitus with diabetic nephropathy: Secondary | ICD-10-CM | POA: Diagnosis not present

## 2023-07-15 DIAGNOSIS — E1165 Type 2 diabetes mellitus with hyperglycemia: Secondary | ICD-10-CM | POA: Diagnosis not present

## 2023-07-20 ENCOUNTER — Ambulatory Visit: Payer: MEDICARE

## 2023-07-20 DIAGNOSIS — I441 Atrioventricular block, second degree: Secondary | ICD-10-CM | POA: Diagnosis not present

## 2023-07-21 ENCOUNTER — Encounter: Payer: Self-pay | Admitting: Internal Medicine

## 2023-07-21 LAB — CUP PACEART REMOTE DEVICE CHECK
Battery Remaining Longevity: 124 mo
Battery Voltage: 3.01 V
Brady Statistic AP VP Percent: 1.21 %
Brady Statistic AP VS Percent: 0 %
Brady Statistic AS VP Percent: 98.64 %
Brady Statistic AS VS Percent: 0.15 %
Brady Statistic RA Percent Paced: 1.3 %
Brady Statistic RV Percent Paced: 99.85 %
Date Time Interrogation Session: 20250317044304
Implantable Lead Connection Status: 753985
Implantable Lead Connection Status: 753985
Implantable Lead Implant Date: 20230615
Implantable Lead Implant Date: 20230615
Implantable Lead Location: 753859
Implantable Lead Location: 753860
Implantable Lead Model: 3830
Implantable Lead Model: 5076
Implantable Pulse Generator Implant Date: 20230615
Lead Channel Impedance Value: 323 Ohm
Lead Channel Impedance Value: 342 Ohm
Lead Channel Impedance Value: 475 Ohm
Lead Channel Impedance Value: 513 Ohm
Lead Channel Pacing Threshold Amplitude: 0.625 V
Lead Channel Pacing Threshold Amplitude: 0.75 V
Lead Channel Pacing Threshold Pulse Width: 0.4 ms
Lead Channel Pacing Threshold Pulse Width: 0.4 ms
Lead Channel Sensing Intrinsic Amplitude: 14.75 mV
Lead Channel Sensing Intrinsic Amplitude: 14.75 mV
Lead Channel Sensing Intrinsic Amplitude: 2.875 mV
Lead Channel Sensing Intrinsic Amplitude: 2.875 mV
Lead Channel Setting Pacing Amplitude: 1.5 V
Lead Channel Setting Pacing Amplitude: 2.5 V
Lead Channel Setting Pacing Pulse Width: 0.4 ms
Lead Channel Setting Sensing Sensitivity: 2 mV
Zone Setting Status: 755011
Zone Setting Status: 755011

## 2023-07-30 DIAGNOSIS — Z1159 Encounter for screening for other viral diseases: Secondary | ICD-10-CM | POA: Diagnosis not present

## 2023-07-30 DIAGNOSIS — J069 Acute upper respiratory infection, unspecified: Secondary | ICD-10-CM | POA: Diagnosis not present

## 2023-08-05 DIAGNOSIS — F4323 Adjustment disorder with mixed anxiety and depressed mood: Secondary | ICD-10-CM | POA: Diagnosis not present

## 2023-08-18 DIAGNOSIS — R739 Hyperglycemia, unspecified: Secondary | ICD-10-CM | POA: Diagnosis not present

## 2023-08-18 DIAGNOSIS — I10 Essential (primary) hypertension: Secondary | ICD-10-CM | POA: Diagnosis not present

## 2023-08-19 DIAGNOSIS — F4323 Adjustment disorder with mixed anxiety and depressed mood: Secondary | ICD-10-CM | POA: Diagnosis not present

## 2023-08-27 DIAGNOSIS — M1991 Primary osteoarthritis, unspecified site: Secondary | ICD-10-CM | POA: Diagnosis not present

## 2023-08-27 DIAGNOSIS — M25569 Pain in unspecified knee: Secondary | ICD-10-CM | POA: Diagnosis not present

## 2023-08-27 DIAGNOSIS — M1230 Palindromic rheumatism, unspecified site: Secondary | ICD-10-CM | POA: Diagnosis not present

## 2023-08-27 DIAGNOSIS — R768 Other specified abnormal immunological findings in serum: Secondary | ICD-10-CM | POA: Diagnosis not present

## 2023-08-27 DIAGNOSIS — Z6827 Body mass index (BMI) 27.0-27.9, adult: Secondary | ICD-10-CM | POA: Diagnosis not present

## 2023-08-27 DIAGNOSIS — E663 Overweight: Secondary | ICD-10-CM | POA: Diagnosis not present

## 2023-08-27 DIAGNOSIS — H04129 Dry eye syndrome of unspecified lacrimal gland: Secondary | ICD-10-CM | POA: Diagnosis not present

## 2023-09-01 NOTE — Progress Notes (Signed)
 Remote pacemaker transmission.

## 2023-09-01 NOTE — Addendum Note (Signed)
 Addended by: Edra Govern D on: 09/01/2023 05:46 PM   Modules accepted: Orders

## 2023-09-02 DIAGNOSIS — F4323 Adjustment disorder with mixed anxiety and depressed mood: Secondary | ICD-10-CM | POA: Diagnosis not present

## 2023-09-16 DIAGNOSIS — F4323 Adjustment disorder with mixed anxiety and depressed mood: Secondary | ICD-10-CM | POA: Diagnosis not present

## 2023-09-30 DIAGNOSIS — F4323 Adjustment disorder with mixed anxiety and depressed mood: Secondary | ICD-10-CM | POA: Diagnosis not present

## 2023-10-14 DIAGNOSIS — F4323 Adjustment disorder with mixed anxiety and depressed mood: Secondary | ICD-10-CM | POA: Diagnosis not present

## 2023-10-19 ENCOUNTER — Ambulatory Visit (INDEPENDENT_AMBULATORY_CARE_PROVIDER_SITE_OTHER): Payer: MEDICARE

## 2023-10-19 DIAGNOSIS — I441 Atrioventricular block, second degree: Secondary | ICD-10-CM

## 2023-10-19 LAB — CUP PACEART REMOTE DEVICE CHECK
Battery Remaining Longevity: 120 mo
Battery Voltage: 3.01 V
Brady Statistic AP VP Percent: 0.55 %
Brady Statistic AP VS Percent: 0 %
Brady Statistic AS VP Percent: 99.39 %
Brady Statistic AS VS Percent: 0.06 %
Brady Statistic RA Percent Paced: 0.56 %
Brady Statistic RV Percent Paced: 99.94 %
Date Time Interrogation Session: 20250615235726
Implantable Lead Connection Status: 753985
Implantable Lead Connection Status: 753985
Implantable Lead Implant Date: 20230615
Implantable Lead Implant Date: 20230615
Implantable Lead Location: 753859
Implantable Lead Location: 753860
Implantable Lead Model: 3830
Implantable Lead Model: 5076
Implantable Pulse Generator Implant Date: 20230615
Lead Channel Impedance Value: 323 Ohm
Lead Channel Impedance Value: 361 Ohm
Lead Channel Impedance Value: 475 Ohm
Lead Channel Impedance Value: 513 Ohm
Lead Channel Pacing Threshold Amplitude: 0.625 V
Lead Channel Pacing Threshold Amplitude: 0.75 V
Lead Channel Pacing Threshold Pulse Width: 0.4 ms
Lead Channel Pacing Threshold Pulse Width: 0.4 ms
Lead Channel Sensing Intrinsic Amplitude: 14.75 mV
Lead Channel Sensing Intrinsic Amplitude: 14.75 mV
Lead Channel Sensing Intrinsic Amplitude: 3 mV
Lead Channel Sensing Intrinsic Amplitude: 3 mV
Lead Channel Setting Pacing Amplitude: 1.5 V
Lead Channel Setting Pacing Amplitude: 2.5 V
Lead Channel Setting Pacing Pulse Width: 0.4 ms
Lead Channel Setting Sensing Sensitivity: 2 mV
Zone Setting Status: 755011
Zone Setting Status: 755011

## 2023-10-21 ENCOUNTER — Ambulatory Visit: Payer: Self-pay | Admitting: Internal Medicine

## 2023-10-28 DIAGNOSIS — F4323 Adjustment disorder with mixed anxiety and depressed mood: Secondary | ICD-10-CM | POA: Diagnosis not present

## 2023-10-30 DIAGNOSIS — Z0000 Encounter for general adult medical examination without abnormal findings: Secondary | ICD-10-CM | POA: Diagnosis not present

## 2023-10-30 DIAGNOSIS — R911 Solitary pulmonary nodule: Secondary | ICD-10-CM | POA: Diagnosis not present

## 2023-10-30 DIAGNOSIS — Z1331 Encounter for screening for depression: Secondary | ICD-10-CM | POA: Diagnosis not present

## 2023-10-30 DIAGNOSIS — Z1339 Encounter for screening examination for other mental health and behavioral disorders: Secondary | ICD-10-CM | POA: Diagnosis not present

## 2023-11-05 ENCOUNTER — Encounter: Payer: Self-pay | Admitting: Nurse Practitioner

## 2023-11-05 ENCOUNTER — Ambulatory Visit: Payer: Self-pay | Admitting: Cardiology

## 2023-11-10 ENCOUNTER — Other Ambulatory Visit: Payer: Self-pay | Admitting: Nurse Practitioner

## 2023-11-10 DIAGNOSIS — R911 Solitary pulmonary nodule: Secondary | ICD-10-CM

## 2023-11-11 DIAGNOSIS — F4323 Adjustment disorder with mixed anxiety and depressed mood: Secondary | ICD-10-CM | POA: Diagnosis not present

## 2023-11-19 ENCOUNTER — Ambulatory Visit
Admission: RE | Admit: 2023-11-19 | Discharge: 2023-11-19 | Disposition: A | Discharge status: Home / Self Care | Payer: MEDICARE | Source: Ambulatory Visit | Attending: Nurse Practitioner | Admitting: Nurse Practitioner

## 2023-11-19 DIAGNOSIS — R911 Solitary pulmonary nodule: Secondary | ICD-10-CM

## 2023-11-19 DIAGNOSIS — R739 Hyperglycemia, unspecified: Secondary | ICD-10-CM | POA: Diagnosis not present

## 2023-11-25 DIAGNOSIS — I129 Hypertensive chronic kidney disease with stage 1 through stage 4 chronic kidney disease, or unspecified chronic kidney disease: Secondary | ICD-10-CM | POA: Diagnosis not present

## 2023-11-25 DIAGNOSIS — E1122 Type 2 diabetes mellitus with diabetic chronic kidney disease: Secondary | ICD-10-CM | POA: Diagnosis not present

## 2023-11-25 DIAGNOSIS — E1165 Type 2 diabetes mellitus with hyperglycemia: Secondary | ICD-10-CM | POA: Diagnosis not present

## 2023-11-25 DIAGNOSIS — F4323 Adjustment disorder with mixed anxiety and depressed mood: Secondary | ICD-10-CM | POA: Diagnosis not present

## 2023-11-25 DIAGNOSIS — Z7984 Long term (current) use of oral hypoglycemic drugs: Secondary | ICD-10-CM | POA: Diagnosis not present

## 2023-11-25 DIAGNOSIS — E1121 Type 2 diabetes mellitus with diabetic nephropathy: Secondary | ICD-10-CM | POA: Diagnosis not present

## 2023-11-25 DIAGNOSIS — J189 Pneumonia, unspecified organism: Secondary | ICD-10-CM | POA: Diagnosis not present

## 2023-11-25 DIAGNOSIS — R0602 Shortness of breath: Secondary | ICD-10-CM | POA: Diagnosis not present

## 2023-11-25 DIAGNOSIS — I10 Essential (primary) hypertension: Secondary | ICD-10-CM | POA: Diagnosis not present

## 2023-11-26 DIAGNOSIS — R911 Solitary pulmonary nodule: Secondary | ICD-10-CM | POA: Diagnosis not present

## 2023-11-26 DIAGNOSIS — R0602 Shortness of breath: Secondary | ICD-10-CM | POA: Diagnosis not present

## 2023-11-26 DIAGNOSIS — I272 Pulmonary hypertension, unspecified: Secondary | ICD-10-CM | POA: Diagnosis not present

## 2023-11-26 DIAGNOSIS — J189 Pneumonia, unspecified organism: Secondary | ICD-10-CM | POA: Diagnosis not present

## 2023-11-26 NOTE — Progress Notes (Signed)
 Remote pacemaker transmission.

## 2023-11-30 DIAGNOSIS — I10 Essential (primary) hypertension: Secondary | ICD-10-CM | POA: Diagnosis not present

## 2023-11-30 DIAGNOSIS — J189 Pneumonia, unspecified organism: Secondary | ICD-10-CM | POA: Diagnosis not present

## 2023-11-30 DIAGNOSIS — I272 Pulmonary hypertension, unspecified: Secondary | ICD-10-CM | POA: Diagnosis not present

## 2023-12-03 DIAGNOSIS — I272 Pulmonary hypertension, unspecified: Secondary | ICD-10-CM | POA: Diagnosis not present

## 2023-12-03 DIAGNOSIS — J45909 Unspecified asthma, uncomplicated: Secondary | ICD-10-CM | POA: Diagnosis not present

## 2023-12-03 DIAGNOSIS — J189 Pneumonia, unspecified organism: Secondary | ICD-10-CM | POA: Diagnosis not present

## 2023-12-07 ENCOUNTER — Ambulatory Visit: Payer: MEDICARE | Attending: Cardiology | Admitting: Cardiology

## 2023-12-07 ENCOUNTER — Encounter: Payer: Self-pay | Admitting: Cardiology

## 2023-12-07 VITALS — BP 120/60 | HR 53 | Ht 69.0 in | Wt 184.0 lb

## 2023-12-07 DIAGNOSIS — I2510 Atherosclerotic heart disease of native coronary artery without angina pectoris: Secondary | ICD-10-CM | POA: Insufficient documentation

## 2023-12-07 DIAGNOSIS — I10 Essential (primary) hypertension: Secondary | ICD-10-CM | POA: Insufficient documentation

## 2023-12-07 DIAGNOSIS — R0609 Other forms of dyspnea: Secondary | ICD-10-CM | POA: Diagnosis not present

## 2023-12-07 DIAGNOSIS — E782 Mixed hyperlipidemia: Secondary | ICD-10-CM | POA: Insufficient documentation

## 2023-12-07 NOTE — Patient Instructions (Signed)
 Medication Instructions:  No medication changes were made at this visit. Continue current regimen.   Testing/Procedures: Your physician has requested that you have an echocardiogram. Echocardiography is a painless test that uses sound waves to create images of your heart. It provides your doctor with information about the size and shape of your heart and how well your heart's chambers and valves are working. This procedure takes approximately one hour. There are no restrictions for this procedure. Please do NOT wear cologne, perfume, aftershave, or lotions (deodorant is allowed). Please arrive 15 minutes prior to your appointment time.  Please note: We ask at that you not bring children with you during ultrasound (echo/ vascular) testing. Due to room size and safety concerns, children are not allowed in the ultrasound rooms during exams. Our front office staff cannot provide observation of children in our lobby area while testing is being conducted. An adult accompanying a patient to their appointment will only be allowed in the ultrasound room at the discretion of the ultrasound technician under special circumstances. We apologize for any inconvenience.   Follow-Up: At [facility], you and your health needs are our priority.  As part of our continuing mission to provide you with exceptional heart care, our providers are all part of one team.  This team includes your primary Cardiologist (physician) and Advanced Practice Providers or APPs (Physician Assistants and Nurse Practitioners) who all work together to provide you with the care you need, when you need it.  Your next appointment:   6 month(s)  Provider:   Newman JINNY Lawrence, MD    We recommend signing up for the patient portal called MyChart.  Sign up information is provided on this After Visit Summary.  MyChart is used to connect with patients for Virtual Visits (Telemedicine).  Patients are able to view lab/test results,  encounter notes, upcoming appointments, etc.  Non-urgent messages can be sent to your provider as well.   To learn more about what you can do with MyChart, go to [email or web address].

## 2023-12-07 NOTE — Progress Notes (Signed)
 Cardiology Office Note:  .   Date:  12/07/2023  ID:  Phillip Allen, DOB 1940/08/07, MRN [id number] PCP: Waylan Almarie SAUNDERS, MD  [facility] Providers Cardiologist:  Newman Lawrence, MD PCP: Waylan Almarie SAUNDERS, MD  Chief Complaint  Patient presents with   Coronary Artery Disease   Hypertension     Phillip Allen is a 83 y.o. 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  He denies any chest pain, has mild unchanged exertional dyspnea.  Blood pressure is well-controlled.   Vitals:   12/07/23 1159  BP: 120/60  Pulse: (!) 53  SpO2: 93%     Review of Systems  Cardiovascular:  Positive for dyspnea on exertion. Negative for chest pain, leg swelling, palpitations and syncope.        Studies Reviewed: SABRA        EKG 12/07/2023: Atrial-sensed ventricular-paced rhythm When compared with ECG of 12-Jan-2023 23:39, Vent. rate has decreased BY  10 BPM    Labs 05/2023: Chol 136, TG 132, HDL 40, LDL 74 HbA1C 6.8% Hb 13.9 Cr 0.7 TSH 2.5  CT chest 11/2023: 1. No suspicious pulmonary nodules requiring additional follow-up. 2. Aortic atherosclerosis (ICD10-I70.0). Coronary artery calcification. 3. Enlarged pulmonic trunk, indicative of pulmonary arterial hypertension.  Echocardiogram 10/2021:  1. Left ventricular ejection fraction, by estimation, is 60 to 65%. The  left ventricle has normal function. The left ventricle has no regional  wall motion abnormalities. Left ventricular diastolic function could not  be evaluated.   2. Right ventricular systolic function is normal. The right ventricular  size is normal.   3. Left atrial size was mildly dilated.   4. Right atrial size was mildly dilated.   5. The mitral valve is normal in structure. Mild mitral valve  regurgitation. No evidence of mitral  stenosis.   6. Tricuspid valve regurgitation is moderate.   7. Inadequate Doppler assessment of aortic valve, unlikely to have  signifciant stenosis.. The aortic valve is tricuspid. There is mild  calcification of the aortic valve. Aortic valve regurgitation is mild.  Aortic valve sclerosis is present, with no  evidence of aortic valve stenosis.   Comparison(s): No significant change compared to previous study on  08/01/2020.    Physical Exam Vitals and nursing note reviewed.  Constitutional:      General: He is not in acute distress. Neck:     Vascular: No JVD.  Cardiovascular:     Rate and Rhythm: Normal rate and regular rhythm.     Heart sounds: Normal heart sounds. No murmur heard. Pulmonary:     Effort: Pulmonary effort is normal.     Breath sounds: Normal breath sounds. No wheezing or rales.  Musculoskeletal:     Right lower leg: No edema.     Left lower leg: No edema.      VISIT DIAGNOSES:   ICD-10-CM   1. Essential hypertension  I10 EKG 12-Lead    2. Dyspnea on exertion  R06.09 ECHOCARDIOGRAM COMPLETE    3. Coronary artery disease involving native coronary artery of native heart without angina pectoris  I25.10 EKG 12-Lead    4. Mixed hyperlipidemia  E78.2 EKG 12-Lead       Phillip Allen is a 83 y.o. 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  Recommend echocardiogram for evaluation of pulmonary hypertension.  CAD: Chronic RPLA and OM occlusions. Recommend medical management. Continue Aspirin , statin, metoprolol , amlodipine . Lipids well controlled.   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   Mixed hyperlipidemia: Continue Crestor  40 mg daily.     No orders of the defined types were placed in this encounter.    F/u in 6 months  Signed, Newman JINNY Lawrence, MD

## 2023-12-09 DIAGNOSIS — F4323 Adjustment disorder with mixed anxiety and depressed mood: Secondary | ICD-10-CM | POA: Diagnosis not present

## 2023-12-23 DIAGNOSIS — F4323 Adjustment disorder with mixed anxiety and depressed mood: Secondary | ICD-10-CM | POA: Diagnosis not present

## 2024-01-06 DIAGNOSIS — F4323 Adjustment disorder with mixed anxiety and depressed mood: Secondary | ICD-10-CM | POA: Diagnosis not present

## 2024-01-07 DIAGNOSIS — I2510 Atherosclerotic heart disease of native coronary artery without angina pectoris: Secondary | ICD-10-CM | POA: Diagnosis not present

## 2024-01-07 DIAGNOSIS — I10 Essential (primary) hypertension: Secondary | ICD-10-CM | POA: Diagnosis not present

## 2024-01-07 DIAGNOSIS — I272 Pulmonary hypertension, unspecified: Secondary | ICD-10-CM | POA: Diagnosis not present

## 2024-01-07 DIAGNOSIS — J45909 Unspecified asthma, uncomplicated: Secondary | ICD-10-CM | POA: Diagnosis not present

## 2024-01-07 DIAGNOSIS — Z23 Encounter for immunization: Secondary | ICD-10-CM | POA: Diagnosis not present

## 2024-01-07 DIAGNOSIS — Z7185 Encounter for immunization safety counseling: Secondary | ICD-10-CM | POA: Diagnosis not present

## 2024-01-14 ENCOUNTER — Ambulatory Visit (HOSPITAL_COMMUNITY)
Admission: RE | Admit: 2024-01-14 | Discharge: 2024-01-14 | Disposition: A | Discharge status: Home / Self Care | Payer: MEDICARE | Source: Ambulatory Visit | Attending: Cardiology | Admitting: Cardiology

## 2024-01-14 DIAGNOSIS — R0609 Other forms of dyspnea: Secondary | ICD-10-CM | POA: Diagnosis not present

## 2024-01-14 LAB — ECHOCARDIOGRAM COMPLETE
Area-P 1/2: 3.48 cm2
P 1/2 time: 868 ms
S' Lateral: 2.8 cm

## 2024-01-15 ENCOUNTER — Ambulatory Visit: Payer: Self-pay | Admitting: Cardiology

## 2024-01-18 ENCOUNTER — Ambulatory Visit (INDEPENDENT_AMBULATORY_CARE_PROVIDER_SITE_OTHER): Payer: MEDICARE

## 2024-01-18 DIAGNOSIS — R0609 Other forms of dyspnea: Secondary | ICD-10-CM | POA: Diagnosis not present

## 2024-01-19 LAB — CUP PACEART REMOTE DEVICE CHECK
Battery Remaining Longevity: 117 mo
Battery Voltage: 3.01 V
Brady Statistic AP VP Percent: 0.9 %
Brady Statistic AP VS Percent: 0 %
Brady Statistic AS VP Percent: 99.02 %
Brady Statistic AS VS Percent: 0.08 %
Brady Statistic RA Percent Paced: 0.93 %
Brady Statistic RV Percent Paced: 99.92 %
Date Time Interrogation Session: 20250915014741
Implantable Lead Connection Status: 753985
Implantable Lead Connection Status: 753985
Implantable Lead Implant Date: 20230615
Implantable Lead Implant Date: 20230615
Implantable Lead Location: 753859
Implantable Lead Location: 753860
Implantable Lead Model: 3830
Implantable Lead Model: 5076
Implantable Pulse Generator Implant Date: 20230615
Lead Channel Impedance Value: 323 Ohm
Lead Channel Impedance Value: 380 Ohm
Lead Channel Impedance Value: 475 Ohm
Lead Channel Impedance Value: 513 Ohm
Lead Channel Pacing Threshold Amplitude: 0.625 V
Lead Channel Pacing Threshold Amplitude: 0.625 V
Lead Channel Pacing Threshold Pulse Width: 0.4 ms
Lead Channel Pacing Threshold Pulse Width: 0.4 ms
Lead Channel Sensing Intrinsic Amplitude: 14.75 mV
Lead Channel Sensing Intrinsic Amplitude: 14.75 mV
Lead Channel Sensing Intrinsic Amplitude: 3 mV
Lead Channel Sensing Intrinsic Amplitude: 3 mV
Lead Channel Setting Pacing Amplitude: 1.5 V
Lead Channel Setting Pacing Amplitude: 2.5 V
Lead Channel Setting Pacing Pulse Width: 0.4 ms
Lead Channel Setting Sensing Sensitivity: 2 mV
Zone Setting Status: 755011
Zone Setting Status: 755011

## 2024-01-20 DIAGNOSIS — F4323 Adjustment disorder with mixed anxiety and depressed mood: Secondary | ICD-10-CM | POA: Diagnosis not present

## 2024-01-22 ENCOUNTER — Ambulatory Visit: Payer: Self-pay | Admitting: Internal Medicine

## 2024-01-23 NOTE — Progress Notes (Signed)
 Remote PPM Transmission

## 2024-02-04 DIAGNOSIS — B351 Tinea unguium: Secondary | ICD-10-CM | POA: Diagnosis not present

## 2024-02-04 DIAGNOSIS — C44619 Basal cell carcinoma of skin of left upper limb, including shoulder: Secondary | ICD-10-CM | POA: Diagnosis not present

## 2024-02-04 DIAGNOSIS — C44329 Squamous cell carcinoma of skin of other parts of face: Secondary | ICD-10-CM | POA: Diagnosis not present

## 2024-02-04 DIAGNOSIS — L814 Other melanin hyperpigmentation: Secondary | ICD-10-CM | POA: Diagnosis not present

## 2024-02-04 DIAGNOSIS — D1801 Hemangioma of skin and subcutaneous tissue: Secondary | ICD-10-CM | POA: Diagnosis not present

## 2024-02-04 DIAGNOSIS — C44319 Basal cell carcinoma of skin of other parts of face: Secondary | ICD-10-CM | POA: Diagnosis not present

## 2024-02-04 DIAGNOSIS — Z85828 Personal history of other malignant neoplasm of skin: Secondary | ICD-10-CM | POA: Diagnosis not present

## 2024-02-04 DIAGNOSIS — C44519 Basal cell carcinoma of skin of other part of trunk: Secondary | ICD-10-CM | POA: Diagnosis not present

## 2024-02-04 DIAGNOSIS — D485 Neoplasm of uncertain behavior of skin: Secondary | ICD-10-CM | POA: Diagnosis not present

## 2024-02-04 DIAGNOSIS — L821 Other seborrheic keratosis: Secondary | ICD-10-CM | POA: Diagnosis not present

## 2024-02-04 DIAGNOSIS — L570 Actinic keratosis: Secondary | ICD-10-CM | POA: Diagnosis not present

## 2024-02-15 DIAGNOSIS — C44519 Basal cell carcinoma of skin of other part of trunk: Secondary | ICD-10-CM | POA: Diagnosis not present

## 2024-02-16 DIAGNOSIS — R739 Hyperglycemia, unspecified: Secondary | ICD-10-CM | POA: Diagnosis not present

## 2024-02-16 DIAGNOSIS — E1165 Type 2 diabetes mellitus with hyperglycemia: Secondary | ICD-10-CM | POA: Diagnosis not present

## 2024-02-17 DIAGNOSIS — F4323 Adjustment disorder with mixed anxiety and depressed mood: Secondary | ICD-10-CM | POA: Diagnosis not present

## 2024-02-23 DIAGNOSIS — Z7984 Long term (current) use of oral hypoglycemic drugs: Secondary | ICD-10-CM | POA: Diagnosis not present

## 2024-02-23 DIAGNOSIS — G4700 Insomnia, unspecified: Secondary | ICD-10-CM | POA: Diagnosis not present

## 2024-02-23 DIAGNOSIS — Z636 Dependent relative needing care at home: Secondary | ICD-10-CM | POA: Diagnosis not present

## 2024-02-23 DIAGNOSIS — F331 Major depressive disorder, recurrent, moderate: Secondary | ICD-10-CM | POA: Diagnosis not present

## 2024-02-23 DIAGNOSIS — E663 Overweight: Secondary | ICD-10-CM | POA: Diagnosis not present

## 2024-02-23 DIAGNOSIS — E1143 Type 2 diabetes mellitus with diabetic autonomic (poly)neuropathy: Secondary | ICD-10-CM | POA: Diagnosis not present

## 2024-02-23 DIAGNOSIS — Z23 Encounter for immunization: Secondary | ICD-10-CM | POA: Diagnosis not present

## 2024-02-23 DIAGNOSIS — I10 Essential (primary) hypertension: Secondary | ICD-10-CM | POA: Diagnosis not present

## 2024-02-23 DIAGNOSIS — F411 Generalized anxiety disorder: Secondary | ICD-10-CM | POA: Diagnosis not present

## 2024-02-23 DIAGNOSIS — E1165 Type 2 diabetes mellitus with hyperglycemia: Secondary | ICD-10-CM | POA: Diagnosis not present

## 2024-03-02 DIAGNOSIS — F331 Major depressive disorder, recurrent, moderate: Secondary | ICD-10-CM | POA: Diagnosis not present

## 2024-03-16 DIAGNOSIS — F331 Major depressive disorder, recurrent, moderate: Secondary | ICD-10-CM | POA: Diagnosis not present

## 2024-03-17 DIAGNOSIS — C44319 Basal cell carcinoma of skin of other parts of face: Secondary | ICD-10-CM | POA: Diagnosis not present

## 2024-03-17 DIAGNOSIS — Z85828 Personal history of other malignant neoplasm of skin: Secondary | ICD-10-CM | POA: Diagnosis not present

## 2024-03-22 DIAGNOSIS — E1165 Type 2 diabetes mellitus with hyperglycemia: Secondary | ICD-10-CM | POA: Diagnosis not present

## 2024-03-22 DIAGNOSIS — M62838 Other muscle spasm: Secondary | ICD-10-CM | POA: Diagnosis not present

## 2024-03-30 DIAGNOSIS — F331 Major depressive disorder, recurrent, moderate: Secondary | ICD-10-CM | POA: Diagnosis not present

## 2024-04-13 DIAGNOSIS — F331 Major depressive disorder, recurrent, moderate: Secondary | ICD-10-CM | POA: Diagnosis not present

## 2024-04-18 ENCOUNTER — Ambulatory Visit: Payer: MEDICARE

## 2024-04-18 DIAGNOSIS — R0609 Other forms of dyspnea: Secondary | ICD-10-CM

## 2024-04-20 LAB — CUP PACEART REMOTE DEVICE CHECK
Battery Remaining Longevity: 114 mo
Battery Voltage: 3 V
Brady Statistic AP VP Percent: 1.7 %
Brady Statistic AP VS Percent: 0 %
Brady Statistic AS VP Percent: 98.15 %
Brady Statistic AS VS Percent: 0.15 %
Brady Statistic RA Percent Paced: 1.76 %
Brady Statistic RV Percent Paced: 99.85 %
Date Time Interrogation Session: 20251214222410
Implantable Lead Connection Status: 753985
Implantable Lead Connection Status: 753985
Implantable Lead Implant Date: 20230615
Implantable Lead Implant Date: 20230615
Implantable Lead Location: 753859
Implantable Lead Location: 753860
Implantable Lead Model: 3830
Implantable Lead Model: 5076
Implantable Pulse Generator Implant Date: 20230615
Lead Channel Impedance Value: 323 Ohm
Lead Channel Impedance Value: 361 Ohm
Lead Channel Impedance Value: 456 Ohm
Lead Channel Impedance Value: 475 Ohm
Lead Channel Pacing Threshold Amplitude: 0.5 V
Lead Channel Pacing Threshold Amplitude: 0.75 V
Lead Channel Pacing Threshold Pulse Width: 0.4 ms
Lead Channel Pacing Threshold Pulse Width: 0.4 ms
Lead Channel Sensing Intrinsic Amplitude: 27.375 mV
Lead Channel Sensing Intrinsic Amplitude: 27.375 mV
Lead Channel Sensing Intrinsic Amplitude: 3 mV
Lead Channel Sensing Intrinsic Amplitude: 3 mV
Lead Channel Setting Pacing Amplitude: 1.5 V
Lead Channel Setting Pacing Amplitude: 2.5 V
Lead Channel Setting Pacing Pulse Width: 0.4 ms
Lead Channel Setting Sensing Sensitivity: 2 mV
Zone Setting Status: 755011
Zone Setting Status: 755011

## 2024-04-22 NOTE — Progress Notes (Signed)
 Remote PPM Transmission

## 2024-05-01 ENCOUNTER — Ambulatory Visit: Payer: Self-pay | Admitting: Internal Medicine
